# Patient Record
Sex: Male | Born: 1975 | Race: Black or African American | Hispanic: No | Smoking: Never smoker
Health system: Southern US, Community
[De-identification: ages and names within clinical notes are randomized; demographics above are authoritative.]

## PROBLEM LIST (undated history)

## (undated) DIAGNOSIS — I10 Essential (primary) hypertension: Secondary | ICD-10-CM

## (undated) DIAGNOSIS — F32A Depression, unspecified: Secondary | ICD-10-CM

## (undated) DIAGNOSIS — I639 Cerebral infarction, unspecified: Secondary | ICD-10-CM

## (undated) DIAGNOSIS — E669 Obesity, unspecified: Secondary | ICD-10-CM

## (undated) DIAGNOSIS — G4730 Sleep apnea, unspecified: Secondary | ICD-10-CM

## (undated) HISTORY — PX: CARPAL TUNNEL RELEASE: SHX101

## (undated) HISTORY — PX: HERNIA REPAIR: SHX51

## (undated) HISTORY — DX: Obesity, unspecified: E66.9

## (undated) HISTORY — PX: ESOPHAGOGASTRODUODENOSCOPY: SHX1529

## (undated) HISTORY — DX: Depression, unspecified: F32.A

## (undated) HISTORY — DX: Sleep apnea, unspecified: G47.30

## (undated) HISTORY — PX: APPENDECTOMY: SHX54

---

## 2003-04-22 ENCOUNTER — Emergency Department (HOSPITAL_COMMUNITY): Admission: EM | Admit: 2003-04-22 | Discharge: 2003-04-22 | Payer: Self-pay | Admitting: Emergency Medicine

## 2003-08-27 ENCOUNTER — Emergency Department (HOSPITAL_COMMUNITY): Admission: EM | Admit: 2003-08-27 | Discharge: 2003-08-27 | Payer: Self-pay | Admitting: Family Medicine

## 2004-03-22 ENCOUNTER — Emergency Department (HOSPITAL_COMMUNITY): Admission: EM | Admit: 2004-03-22 | Discharge: 2004-03-22 | Payer: Self-pay | Admitting: Family Medicine

## 2004-05-03 ENCOUNTER — Emergency Department (HOSPITAL_COMMUNITY): Admission: EM | Admit: 2004-05-03 | Discharge: 2004-05-03 | Payer: Self-pay | Admitting: Family Medicine

## 2004-08-26 ENCOUNTER — Ambulatory Visit (HOSPITAL_COMMUNITY): Admission: RE | Admit: 2004-08-26 | Discharge: 2004-08-26 | Payer: Self-pay | Admitting: Family Medicine

## 2004-08-26 ENCOUNTER — Ambulatory Visit (HOSPITAL_COMMUNITY): Admission: RE | Admit: 2004-08-26 | Discharge: 2004-08-26 | Payer: Self-pay | Admitting: Sports Medicine

## 2004-08-26 ENCOUNTER — Ambulatory Visit: Payer: Self-pay | Admitting: Sports Medicine

## 2004-08-30 ENCOUNTER — Emergency Department (HOSPITAL_COMMUNITY): Admission: EM | Admit: 2004-08-30 | Discharge: 2004-08-30 | Payer: Self-pay | Admitting: Family Medicine

## 2004-09-14 ENCOUNTER — Ambulatory Visit (HOSPITAL_BASED_OUTPATIENT_CLINIC_OR_DEPARTMENT_OTHER): Admission: RE | Admit: 2004-09-14 | Discharge: 2004-09-14 | Payer: Self-pay | Admitting: Sports Medicine

## 2004-09-19 ENCOUNTER — Ambulatory Visit: Payer: Self-pay | Admitting: Internal Medicine

## 2004-09-23 ENCOUNTER — Ambulatory Visit: Payer: Self-pay | Admitting: Family Medicine

## 2004-11-24 ENCOUNTER — Ambulatory Visit: Payer: Self-pay | Admitting: Sports Medicine

## 2004-12-25 ENCOUNTER — Emergency Department (HOSPITAL_COMMUNITY): Admission: EM | Admit: 2004-12-25 | Discharge: 2004-12-25 | Payer: Self-pay | Admitting: Family Medicine

## 2005-02-20 ENCOUNTER — Emergency Department (HOSPITAL_COMMUNITY): Admission: EM | Admit: 2005-02-20 | Discharge: 2005-02-20 | Payer: Self-pay | Admitting: Emergency Medicine

## 2012-01-05 ENCOUNTER — Emergency Department (HOSPITAL_BASED_OUTPATIENT_CLINIC_OR_DEPARTMENT_OTHER): Payer: Self-pay

## 2012-01-05 ENCOUNTER — Encounter (HOSPITAL_BASED_OUTPATIENT_CLINIC_OR_DEPARTMENT_OTHER): Payer: Self-pay | Admitting: *Deleted

## 2012-01-05 ENCOUNTER — Emergency Department (HOSPITAL_BASED_OUTPATIENT_CLINIC_OR_DEPARTMENT_OTHER)
Admission: EM | Admit: 2012-01-05 | Discharge: 2012-01-05 | Disposition: A | Discharge status: Home / Self Care | Payer: Self-pay | Attending: Emergency Medicine | Admitting: Emergency Medicine

## 2012-01-05 DIAGNOSIS — I10 Essential (primary) hypertension: Secondary | ICD-10-CM | POA: Insufficient documentation

## 2012-01-05 DIAGNOSIS — M545 Low back pain, unspecified: Secondary | ICD-10-CM

## 2012-01-05 DIAGNOSIS — E119 Type 2 diabetes mellitus without complications: Secondary | ICD-10-CM | POA: Insufficient documentation

## 2012-01-05 HISTORY — DX: Essential (primary) hypertension: I10

## 2012-01-05 LAB — URINALYSIS, ROUTINE W REFLEX MICROSCOPIC
Bilirubin Urine: NEGATIVE
Glucose, UA: >1000 mg/dL — AB
Hgb urine dipstick: NEGATIVE
Ketones, ur: 15 mg/dL — AB
Leukocytes, UA: NEGATIVE
Nitrite: NEGATIVE
Protein, ur: NEGATIVE mg/dL
Specific Gravity, Urine: 1.03 (ref 1.005–1.030)
Urobilinogen, UA: 1 mg/dL (ref 0.0–1.0)
pH: 6 (ref 5.0–8.0)

## 2012-01-05 LAB — URINE MICROSCOPIC-ADD ON

## 2012-01-05 LAB — GLUCOSE, CAPILLARY: Glucose-Capillary: 206 mg/dL — ABNORMAL HIGH (ref 70–99)

## 2012-01-05 MED ORDER — IBUPROFEN 800 MG PO TABS
800.0000 mg | ORAL_TABLET | Freq: Once | ORAL | Status: AC
  Administered 2012-01-05: 800 mg via ORAL
  Filled 2012-01-05: qty 1

## 2012-01-05 MED ORDER — DIAZEPAM 5 MG PO TABS
5.0000 mg | ORAL_TABLET | Freq: Once | ORAL | Status: AC
  Administered 2012-01-05: 5 mg via ORAL
  Filled 2012-01-05: qty 1

## 2012-01-05 MED ORDER — ONDANSETRON HCL 4 MG PO TABS: 4.0000 mg | ORAL_TABLET | Freq: Four times a day (QID) | ORAL | Status: AC

## 2012-01-05 MED ORDER — HYDROCODONE-ACETAMINOPHEN 5-325 MG PO TABS
1.0000 | ORAL_TABLET | Freq: Once | ORAL | Status: AC
  Administered 2012-01-05: 1 via ORAL
  Filled 2012-01-05: qty 1

## 2012-01-05 MED ORDER — HYDROCODONE-ACETAMINOPHEN 5-500 MG PO TABS: 1.0000 | ORAL_TABLET | Freq: Four times a day (QID) | ORAL | Status: AC | PRN

## 2012-01-05 MED ORDER — IBUPROFEN 600 MG PO TABS: 600.0000 mg | ORAL_TABLET | Freq: Four times a day (QID) | ORAL | Status: AC | PRN

## 2012-01-05 NOTE — ED Provider Notes (Signed)
History     CSN: [id number]  Arrival date & time 01/05/12  1146   First MD Initiated Contact with Patient 01/05/12 1305      Chief Complaint  Patient presents with  . Flank Pain  . Back Pain    (Consider location/radiation/quality/duration/timing/severity/associated sxs/prior treatment) HPI Pt presents with c/o left sided low back pain.  Pain is worse with movement and palpation.  Pain started approx 3 days ago and has been gradually worsening.  No dysuria.  No fever/chills.  No falls or injuries.  He denies any new activities.  No weakness of legs, no incontinence of bowel or bladder, no urinary retention.  He has not had any treatment for symptoms prior to arrival.  There are no other associated systemic symptoms, there are no other alleviating or modifying factors.   Past Medical History  Diagnosis Date  . Hypertension   . Diabetes mellitus     Past Surgical History  Procedure Date  . Appendectomy   . Hernia repair   . Carpal tunnel release     No family history on file.  History  Substance Use Topics  . Smoking status: Never Smoker   . Smokeless tobacco: Never Used  . Alcohol Use: No      Review of Systems ROS reviewed and all otherwise negative except for mentioned in HPI  Allergies  Percocet  Home Medications   Current Outpatient Rx  Name Route Sig Dispense Refill  . LISINOPRIL-HYDROCHLOROTHIAZIDE 20-25 MG PO TABS Oral Take 1 tablet by mouth daily.    Marland Kitchen METFORMIN HCL 500 MG PO TABS Oral Take 500 mg by mouth 2 (two) times daily with a meal.    . HYDROCODONE-ACETAMINOPHEN 5-500 MG PO TABS Oral Take 1-2 tablets by mouth every 6 (six) hours as needed for pain. 15 tablet 0  . IBUPROFEN 600 MG PO TABS Oral Take 1 tablet (600 mg total) by mouth every 6 (six) hours as needed for pain. 30 tablet 0  . ONDANSETRON HCL 4 MG PO TABS Oral Take 1 tablet (4 mg total) by mouth every 6 (six) hours. 12 tablet 0    BP 143/88  Pulse 67  Temp 98.7 F (37.1 C) (Oral)   Resp 18  Ht 5\' 8"  (1.727 m)  Wt 330 lb (149.687 kg)  BMI 50.18 kg/m2  SpO2 99% Vitals reviewed Physical Exam Physical Examination: General appearance - alert, well appearing, and in no distress Mental status - alert, oriented to person, place, and time Eyes - no scleral icterus, no conjunctival injection Neck - supple, no midline tenderness, FROM without pain Chest - clear to auscultation, no wheezes, rales or rhonchi, symmetric air entry Heart - normal rate, regular rhythm, normal S1, S2, no murmurs, rubs, clicks or gallops Abdomen - soft, nontender, nondistended, no masses or organomegaly Back exam - ttp overlying lumbar spine in midline as well as left paraspinous tenderness in lumbar region, no CVA tenderness Neurological - alert, oriented, normal speech, strength 5/5 in extremities x 4, sensation intact Musculoskeletal - no joint tenderness, deformity or swelling Extremities - peripheral pulses normal, no pedal edema, no clubbing or cyanosis Skin - normal coloration and turgor, no rashes  ED Course  Procedures (including critical care time)  Labs Reviewed  URINALYSIS, ROUTINE W REFLEX MICROSCOPIC - Abnormal; Notable for the following:    Glucose, UA >1000 (*)     Ketones, ur 15 (*)     All other components within normal limits  GLUCOSE, CAPILLARY - Abnormal; Notable  for the following:    Glucose-Capillary 206 (*)     All other components within normal limits  URINE MICROSCOPIC-ADD ON   No results found.   1. Low back pain       MDM  Pt presenting with c/o low back pain- no signs or symptoms of cauda equina.  Pain is musculoskeletal in nature on exam.  No hematuria or signs of infection on UA.  Did have > 1000 glucose.  Pt states he was recently diagnosed with diabetes, has been taking metformin.  Blood glucose checked and is 206.  Pt is overall nontoxic and well hydrated in appearance.  Pt advised of strict return precautions and to have his blood sugar checked  regularly.  Discharged with strict return precautions.  Pt agreeable with plan.        Ethelda Chick, MD 01/07/12 1425

## 2012-01-05 NOTE — ED Notes (Signed)
Returned from [employer], family remains at bedside

## 2012-01-05 NOTE — ED Notes (Signed)
Patient transported to X-ray 

## 2012-01-05 NOTE — ED Notes (Signed)
Left mid quad pain x 3 days. No hx of same.

## 2012-01-05 NOTE — ED Notes (Signed)
Patient states he has had pain in his left lower back radiating into his left flank and groin area for the last 3 days.

## 2014-08-20 ENCOUNTER — Emergency Department (HOSPITAL_BASED_OUTPATIENT_CLINIC_OR_DEPARTMENT_OTHER): Payer: Medicaid Other

## 2014-08-20 ENCOUNTER — Encounter (HOSPITAL_BASED_OUTPATIENT_CLINIC_OR_DEPARTMENT_OTHER): Payer: Self-pay | Admitting: Emergency Medicine

## 2014-08-20 ENCOUNTER — Emergency Department (HOSPITAL_BASED_OUTPATIENT_CLINIC_OR_DEPARTMENT_OTHER)
Admission: EM | Admit: 2014-08-20 | Discharge: 2014-08-20 | Disposition: A | Discharge status: Home / Self Care | Payer: Medicaid Other | Attending: Emergency Medicine | Admitting: Emergency Medicine

## 2014-08-20 DIAGNOSIS — I10 Essential (primary) hypertension: Secondary | ICD-10-CM | POA: Diagnosis not present

## 2014-08-20 DIAGNOSIS — Z79899 Other long term (current) drug therapy: Secondary | ICD-10-CM | POA: Insufficient documentation

## 2014-08-20 DIAGNOSIS — R0789 Other chest pain: Secondary | ICD-10-CM | POA: Diagnosis not present

## 2014-08-20 DIAGNOSIS — Z794 Long term (current) use of insulin: Secondary | ICD-10-CM | POA: Insufficient documentation

## 2014-08-20 DIAGNOSIS — J9801 Acute bronchospasm: Secondary | ICD-10-CM

## 2014-08-20 DIAGNOSIS — E119 Type 2 diabetes mellitus without complications: Secondary | ICD-10-CM | POA: Insufficient documentation

## 2014-08-20 DIAGNOSIS — R079 Chest pain, unspecified: Secondary | ICD-10-CM | POA: Diagnosis present

## 2014-08-20 LAB — TROPONIN I: Troponin I: <0.03 ng/mL (ref <0.031)

## 2014-08-20 LAB — CBC WITH DIFFERENTIAL/PLATELET
Basophils Absolute: 0 10*3/uL (ref 0.0–0.1)
Basophils Relative: 0 % (ref 0–1)
Eosinophils Absolute: 0.1 10*3/uL (ref 0.0–0.7)
Eosinophils Relative: 2 % (ref 0–5)
HCT: 39.5 % (ref 39.0–52.0)
Hemoglobin: 13.8 g/dL (ref 13.0–17.0)
Lymphocytes Relative: 32 % (ref 12–46)
Lymphs Abs: 2.4 10*3/uL (ref 0.7–4.0)
MCH: 29.9 pg (ref 26.0–34.0)
MCHC: 34.9 g/dL (ref 30.0–36.0)
MCV: 85.5 fL (ref 78.0–100.0)
Monocytes Absolute: 0.7 10*3/uL (ref 0.1–1.0)
Monocytes Relative: 9 % (ref 3–12)
Neutro Abs: 4.3 10*3/uL (ref 1.7–7.7)
Neutrophils Relative %: 57 % (ref 43–77)
Platelets: 240 10*3/uL (ref 150–400)
RBC: 4.62 MIL/uL (ref 4.22–5.81)
RDW: 12.3 % (ref 11.5–15.5)
WBC: 7.6 10*3/uL (ref 4.0–10.5)

## 2014-08-20 LAB — BASIC METABOLIC PANEL
Anion gap: 9 (ref 5–15)
BUN: 6 mg/dL (ref 6–23)
CO2: 26 mmol/L (ref 19–32)
Calcium: 9.1 mg/dL (ref 8.4–10.5)
Chloride: 100 mmol/L (ref 96–112)
Creatinine, Ser: 0.74 mg/dL (ref 0.50–1.35)
GFR calc Af Amer: >90 mL/min (ref >90)
GFR calc non Af Amer: >90 mL/min (ref >90)
Glucose, Bld: 365 mg/dL — ABNORMAL HIGH (ref 70–99)
Potassium: 3.4 mmol/L — ABNORMAL LOW (ref 3.5–5.1)
Sodium: 135 mmol/L (ref 135–145)

## 2014-08-20 MED ORDER — HYDROCODONE-ACETAMINOPHEN 5-325 MG PO TABS: 1.0000 | ORAL_TABLET | Freq: Four times a day (QID) | ORAL | Status: DC | PRN

## 2014-08-20 MED ORDER — ALBUTEROL SULFATE HFA 108 (90 BASE) MCG/ACT IN AERS
2.0000 | INHALATION_SPRAY | RESPIRATORY_TRACT | Status: DC | PRN
  Administered 2014-08-20: 2 via RESPIRATORY_TRACT
  Filled 2014-08-20: qty 6.7

## 2014-08-20 NOTE — ED Notes (Signed)
Pt sleeping  Waiting for md

## 2014-08-20 NOTE — ED Notes (Signed)
Onset x5-6 days  Chest pain  Worse with movement and inspirations.  Was seen for sam,e at [facility]

## 2014-08-20 NOTE — ED Provider Notes (Addendum)
CSN: [id number]     Arrival date & time 08/20/14  0113 History   First MD Initiated Contact with Patient 08/20/14 0543     Chief Complaint  Patient presents with  . Chest Pain     (Consider location/radiation/quality/duration/timing/severity/associated sxs/prior Treatment) HPI  This is a 39 year old male with a one-week history of pain in his left upper chest. He describes the pain as a pressure. It is moderate to severe at its worst. It is worse with breathing, movement and lying supine. It is improved when sitting up. The pain radiates to his left neck and shoulder. He is also been having some shortness of breath and cough. He was seen 3 days ago at [facility] where he had a negative workup. His symptoms persisted so he came here.  Past Medical History  Diagnosis Date  . Hypertension   . Diabetes mellitus    Past Surgical History  Procedure Laterality Date  . Appendectomy    . Hernia repair    . Carpal tunnel release     No family history on file. History  Substance Use Topics  . Smoking status: Never Smoker   . Smokeless tobacco: Never Used  . Alcohol Use: No    Review of Systems  All other systems reviewed and are negative.   Allergies  Percocet  Home Medications   Prior to Admission medications   Medication Sig Start Date End Date Taking? Authorizing Provider  glipiZIDE (GLUCOTROL) 10 MG tablet Take 10 mg by mouth daily before breakfast.   Yes Historical Provider, MD  insulin glargine (LANTUS) 100 UNIT/ML injection Inject 40 Units into the skin at bedtime.   Yes Historical Provider, MD  potassium chloride SA (K-DUR,KLOR-CON) 20 MEQ tablet Take 20 mEq by mouth 2 (two) times daily.   Yes Historical Provider, MD  lisinopril-hydrochlorothiazide (PRINZIDE,ZESTORETIC) 20-25 MG per tablet Take 1 tablet by mouth daily.    Historical Provider, MD  metFORMIN (GLUCOPHAGE) 500 MG tablet Take 500 mg by mouth 2 (two) times daily with a meal.    Historical Provider, MD    BP 150/88 mmHg  Pulse 70  Temp(Src) 98.3 F (36.8 C) (Oral)  Resp 22  Ht 5\' 8"  (1.727 m)  Wt 322 lb (146.058 kg)  BMI 48.97 kg/m2  SpO2 98%   Physical Exam  General: Well-developed, well-nourished male in no acute distress; appearance consistent with age of record HENT: normocephalic; atraumatic Eyes: pupils equal, round and reactive to light; extraocular muscles intact Neck: supple; mild exacerbation of chest pain with rotation of head to right Heart: regular rate and rhythm Lungs: Decreased air movement bilaterally without wheezing; occasional cough Chest: Left upper chest wall tenderness which reproduces the pain of the chief complaint Abdomen: soft; nondistended; nontender; bowel sounds present Extremities: No deformity; full range of motion; pulses normal; mild exacerbation of chest pain with movement of left arm at the shoulder Neurologic: Awake, alert and oriented; motor function intact in all extremities and symmetric; no facial droop Skin: Warm and dry Psychiatric: Normal mood and affect    ED Course  Procedures (including critical care time)   MDM   Nursing notes and vitals signs, including pulse oximetry, reviewed.  Summary of this visit's results, reviewed by myself:   EKG Interpretation  Date/Time:  Wednesday August 20 2014 01:20:25 EDT Ventricular Rate:  81 PR Interval:  174 QRS Duration: 104 QT Interval:  370 QTC Calculation: 429 R Axis:   -4 Text Interpretation:  Normal sinus rhythm Nonspecific T  wave abnormality Abnormal ECG No significant change was found Confirmed by Harleen Fineberg  MD, Jenny Reichmann ([id number]) on 08/20/2014 5:45:05 AM       Labs:  Results for orders placed or performed during the hospital encounter of 08/20/14 (from the past 24 hour(s))  Basic metabolic panel     Status: Abnormal   Collection Time: 08/20/14  1:45 AM  Result Value Ref Range   Sodium 135 135 - 145 mmol/L   Potassium 3.4 (L) 3.5 - 5.1 mmol/L   Chloride 100 96 - 112 mmol/L    CO2 26 19 - 32 mmol/L   Glucose, Bld 365 (H) 70 - 99 mg/dL   BUN 6 6 - 23 mg/dL   Creatinine, Ser 0.74 0.50 - 1.35 mg/dL   Calcium 9.1 8.4 - 10.5 mg/dL   GFR calc non Af Amer >90 >90 mL/min   GFR calc Af Amer >90 >90 mL/min   Anion gap 9 5 - 15  Troponin I (M[facility])     Status: None   Collection Time: 08/20/14  1:45 AM  Result Value Ref Range   Troponin I <0.03 <0.031 ng/mL  CBC with Differential     Status: None   Collection Time: 08/20/14  1:45 AM  Result Value Ref Range   WBC 7.6 4.0 - 10.5 K/uL   RBC 4.62 4.22 - 5.81 MIL/uL   Hemoglobin 13.8 13.0 - 17.0 g/dL   HCT 39.5 39.0 - 52.0 %   MCV 85.5 78.0 - 100.0 fL   MCH 29.9 26.0 - 34.0 pg   MCHC 34.9 30.0 - 36.0 g/dL   RDW 12.3 11.5 - 15.5 %   Platelets 240 150 - 400 K/uL   Neutrophils Relative % 57 43 - 77 %   Neutro Abs 4.3 1.7 - 7.7 K/uL   Lymphocytes Relative 32 12 - 46 %   Lymphs Abs 2.4 0.7 - 4.0 K/uL   Monocytes Relative 9 3 - 12 %   Monocytes Absolute 0.7 0.1 - 1.0 K/uL   Eosinophils Relative 2 0 - 5 %   Eosinophils Absolute 0.1 0.0 - 0.7 K/uL   Basophils Relative 0 0 - 1 %   Basophils Absolute 0.0 0.0 - 0.1 K/uL    Imaging Studies: Dg Chest 2 View  08/20/2014   CLINICAL DATA:  Mid to left-sided chest pain for 4 days. Initial encounter.  EXAM: CHEST  2 VIEW  COMPARISON:  Chest radiograph performed 08/17/2014  FINDINGS: The lungs are well-aerated and clear. There is no evidence of focal opacification, pleural effusion or pneumothorax.  The heart is borderline normal in size. No acute osseous abnormalities are seen.  IMPRESSION: No acute cardiopulmonary process seen.   Electronically Signed   By: Garald Balding M.D.   On: 08/20/2014 02:43   5:56 AM History and exam consistent with a combination of chest wall pain and mild bronchospasm. Patient has no history of asthma. He was advised of his elevated glucose; he states he has been compliant with his insulin and oral anti-hyperglycemics. He will follow-up with his primary  care physician, Dr. Alroy Dust.  6:17 AM Air movement improved significantly after albuterol inhaler treatment.    Shanon Rosser, MD 08/20/14 1103  Shanon Rosser, MD 08/20/14 [phone]

## 2014-08-20 NOTE — ED Notes (Signed)
C/o chest pressure x 5 days  Was seen at [facility] on Sunday for same,  States pressure to chest radiating to left shoulder and arm  [pain increased w movement and inspiration

## 2014-11-24 ENCOUNTER — Encounter (HOSPITAL_BASED_OUTPATIENT_CLINIC_OR_DEPARTMENT_OTHER): Payer: Self-pay | Admitting: *Deleted

## 2014-11-24 ENCOUNTER — Emergency Department (HOSPITAL_BASED_OUTPATIENT_CLINIC_OR_DEPARTMENT_OTHER)
Admission: EM | Admit: 2014-11-24 | Discharge: 2014-11-24 | Disposition: A | Discharge status: Home / Self Care | Payer: Medicaid Other | Attending: Emergency Medicine | Admitting: Emergency Medicine

## 2014-11-24 ENCOUNTER — Emergency Department (HOSPITAL_BASED_OUTPATIENT_CLINIC_OR_DEPARTMENT_OTHER): Payer: Medicaid Other

## 2014-11-24 DIAGNOSIS — R0602 Shortness of breath: Secondary | ICD-10-CM | POA: Insufficient documentation

## 2014-11-24 DIAGNOSIS — I10 Essential (primary) hypertension: Secondary | ICD-10-CM | POA: Insufficient documentation

## 2014-11-24 DIAGNOSIS — R739 Hyperglycemia, unspecified: Secondary | ICD-10-CM

## 2014-11-24 DIAGNOSIS — E1165 Type 2 diabetes mellitus with hyperglycemia: Secondary | ICD-10-CM | POA: Diagnosis not present

## 2014-11-24 DIAGNOSIS — R079 Chest pain, unspecified: Secondary | ICD-10-CM | POA: Insufficient documentation

## 2014-11-24 DIAGNOSIS — Z794 Long term (current) use of insulin: Secondary | ICD-10-CM | POA: Insufficient documentation

## 2014-11-24 DIAGNOSIS — Z79899 Other long term (current) drug therapy: Secondary | ICD-10-CM | POA: Diagnosis not present

## 2014-11-24 DIAGNOSIS — F439 Reaction to severe stress, unspecified: Secondary | ICD-10-CM | POA: Diagnosis not present

## 2014-11-24 LAB — I-STAT CHEM 8, ED
BUN: 4 mg/dL — ABNORMAL LOW (ref 6–20)
Calcium, Ion: 1.3 mmol/L — ABNORMAL HIGH (ref 1.12–1.23)
Chloride: 99 mmol/L — ABNORMAL LOW (ref 101–111)
Creatinine, Ser: 0.7 mg/dL (ref 0.61–1.24)
Glucose, Bld: 366 mg/dL — ABNORMAL HIGH (ref 65–99)
HCT: 43 % (ref 39.0–52.0)
Hemoglobin: 14.6 g/dL (ref 13.0–17.0)
Potassium: 3.9 mmol/L (ref 3.5–5.1)
Sodium: 138 mmol/L (ref 135–145)
TCO2: 23 mmol/L (ref 0–100)

## 2014-11-24 LAB — COMPREHENSIVE METABOLIC PANEL
ALBUMIN: 3.9 g/dL (ref 3.5–5.0)
ALK PHOS: 53 U/L (ref 38–126)
ALT: 31 U/L (ref 17–63)
ANION GAP: 11 (ref 5–15)
AST: 30 U/L (ref 15–41)
BILIRUBIN TOTAL: 1.2 mg/dL (ref 0.3–1.2)
BUN: <5 mg/dL — ABNORMAL LOW (ref 6–20)
CALCIUM: 9 mg/dL (ref 8.9–10.3)
CO2: 22 mmol/L (ref 22–32)
CREATININE: 1.04 mg/dL (ref 0.61–1.24)
Chloride: 101 mmol/L (ref 101–111)
GFR calc Af Amer: >60 mL/min (ref >60)
Glucose, Bld: 456 mg/dL — ABNORMAL HIGH (ref 65–99)
POTASSIUM: 3.7 mmol/L (ref 3.5–5.1)
SODIUM: 134 mmol/L — AB (ref 135–145)
TOTAL PROTEIN: 7 g/dL (ref 6.5–8.1)

## 2014-11-24 LAB — CBC WITH DIFFERENTIAL/PLATELET
Basophils Absolute: 0 10*3/uL (ref 0.0–0.1)
Basophils Relative: 0 % (ref 0–1)
EOS ABS: 0.1 10*3/uL (ref 0.0–0.7)
Eosinophils Relative: 1 % (ref 0–5)
HEMATOCRIT: 39.2 % (ref 39.0–52.0)
Hemoglobin: 13.1 g/dL (ref 13.0–17.0)
Lymphocytes Relative: 25 % (ref 12–46)
Lymphs Abs: 2.1 10*3/uL (ref 0.7–4.0)
MCH: 29.5 pg (ref 26.0–34.0)
MCHC: 33.4 g/dL (ref 30.0–36.0)
MCV: 88.3 fL (ref 78.0–100.0)
MONOS PCT: 10 % (ref 3–12)
Monocytes Absolute: 0.8 10*3/uL (ref 0.1–1.0)
Neutro Abs: 5.1 10*3/uL (ref 1.7–7.7)
Neutrophils Relative %: 64 % (ref 43–77)
PLATELETS: 267 10*3/uL (ref 150–400)
RBC: 4.44 MIL/uL (ref 4.22–5.81)
RDW: 12.4 % (ref 11.5–15.5)
WBC: 8.1 10*3/uL (ref 4.0–10.5)

## 2014-11-24 LAB — TROPONIN I
Troponin I: <0.03 ng/mL (ref <0.031)
Troponin I: <0.03 ng/mL (ref <0.031)

## 2014-11-24 MED ORDER — HYDROCODONE-ACETAMINOPHEN 5-325 MG PO TABS
2.0000 | ORAL_TABLET | Freq: Once | ORAL | Status: AC
  Administered 2014-11-24: 2 via ORAL
  Filled 2014-11-24: qty 2

## 2014-11-24 MED ORDER — GI COCKTAIL ~~LOC~~
30.0000 mL | Freq: Once | ORAL | Status: AC
  Administered 2014-11-24: 30 mL via ORAL
  Filled 2014-11-24: qty 30

## 2014-11-24 NOTE — ED Notes (Signed)
Family at bedside. 

## 2014-11-24 NOTE — ED Notes (Signed)
Student PA-C at bedside for evaluation.

## 2014-11-24 NOTE — ED Provider Notes (Signed)
CSN: [id number]     Arrival date & time 11/24/14  1309 History   First MD Initiated Contact with Patient 11/24/14 1310     Chief Complaint  Patient presents with  . 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 Pain is a constant pressure with occasional episodes of sharp, stabbing pain that come and go at random, radiating to both sides of his jaw with an associated sensation of "a sour candy stuck in my throat". Admits to associated shortness of breath, worse on exertion. This morning, he developed a mild, nonproductive cough. 2 days ago, he started to feel as if he were dragging his left leg on occasion while walking. Denies having this symptom today. Denies fever, chills, nausea, vomiting, diaphoresis, abdominal pain, headache, extremity numbness or tingling. Denies ever having chest pain like this in the past. Reports having chest pain back in April and was told it was his chest wall. He has not tried any medications for his symptoms. Denies a history of heartburn or acid reflux. Nonsmoker. No family history of early heart disease.  [name] presenting with chest pain. The history is provided by the patient.  Chest Pain Associated symptoms: shortness of breath     Past Medical History  Diagnosis Date  . Hypertension   . Diabetes mellitus    Past Surgical History  Procedure Laterality Date  . Appendectomy    . Hernia repair    . Carpal tunnel release     No family history on file. History  Substance Use Topics  . Smoking status: Never Smoker   . Smokeless tobacco: Never Used  . Alcohol Use: No    Review of Systems  Respiratory: Positive for shortness of breath.   Cardiovascular: Positive for chest  pain.  All other systems reviewed and are negative.     Allergies  Percocet  Home Medications   Prior to Admission medications   Medication Sig Start Date End Date Taking? Authorizing Provider  glipiZIDE (GLUCOTROL) 10 MG tablet Take 10 mg by mouth daily before breakfast.    Historical Provider, MD  HYDROcodone-acetaminophen (NORCO/VICODIN) 5-325 MG per tablet Take 1-2 tablets by mouth every 6 (six) hours as needed (for pain). 08/20/14   John Molpus, MD  insulin glargine (LANTUS) 100 UNIT/ML injection Inject 40 Units into the skin at bedtime.    Historical Provider, MD  lisinopril-hydrochlorothiazide (PRINZIDE,ZESTORETIC) 20-25 MG per tablet Take 1 tablet by mouth daily.    Historical Provider, MD  metFORMIN (GLUCOPHAGE) 500 MG tablet Take 500 mg by mouth 2 (two) times daily with a meal.    Historical Provider, MD  potassium chloride SA (K-DUR,KLOR-CON) 20 MEQ tablet Take 20 mEq by mouth 2 (two) times daily.    Historical Provider, MD   BP 167/95 mmHg  Pulse 62  Temp(Src) 98.1 F (36.7 C) (Oral)  Resp 16  Ht 5\' 8"  (1.727 m)  Wt 322 lb (146.058 kg)  BMI 48.97 kg/m2  SpO2 100% Physical Exam  Constitutional: He is oriented to person, place, and time. He appears well-developed and well-nourished. No distress.  Morbidly obese.  HENT:  Head: Normocephalic and atraumatic.  Mouth/Throat: Oropharynx is clear and moist.  Eyes: Conjunctivae and EOM are normal. Pupils are equal, round, and reactive to light.  Neck: Normal range of motion.  Neck supple. No JVD present.  Cardiovascular: Normal rate, regular rhythm, normal heart sounds and intact distal pulses.   No extremity edema.  Pulmonary/Chest: Effort normal and breath sounds normal. No respiratory distress. He exhibits no tenderness.  Abdominal: Soft. Bowel sounds are normal. There is no tenderness.  Musculoskeletal: Normal range of motion. He exhibits no edema.  Neurological: He is alert and oriented to person, place, and time. He  has normal strength. No sensory deficit.  Speech fluent, goal oriented. Moves extremities without ataxia. Equal grip strength bilateral.  Skin: Skin is warm and dry. He is not diaphoretic.  Psychiatric: He has a normal mood and affect. His behavior is normal.  Nursing note and vitals reviewed.   ED Course  Procedures (including critical care time) Labs Review Labs Reviewed  I-STAT CHEM 8, ED - Abnormal; Notable for the following:    Chloride 99 (*)    BUN 4 (*)    Glucose, Bld 366 (*)    Calcium, Ion 1.30 (*)    All other components within normal limits  TROPONIN I  CBC WITH DIFFERENTIAL/PLATELET  COMPREHENSIVE METABOLIC PANEL  TROPONIN I    Imaging Review Dg Chest 2 View  11/24/2014   CLINICAL DATA:  Chest pain and tightness today, shortness of breath, nonsmoker  EXAM: CHEST  2 VIEW  COMPARISON:  08/20/2014  FINDINGS: The heart size and mediastinal contours are within normal limits. Both lungs are clear. The visualized skeletal structures are unremarkable.  IMPRESSION: No active cardiopulmonary disease.   Electronically Signed   By: Skipper Cliche M.D.   On: 11/24/2014 14:34     EKG Interpretation   Date/Time:  Monday November 24 2014 13:17:54 EDT Ventricular Rate:  71 PR Interval:  180 QRS Duration: 102 QT Interval:  392 QTC Calculation: 425 R Axis:   37 Text Interpretation:  Normal sinus rhythm Nonspecific T wave abnormality  Abnormal ECG Sinus rhythm T wave abnormality Abnormal ekg Confirmed by  Carmin Muskrat  MD ([id number]) on 11/24/2014 2:59:44 PM      MDM   Final diagnoses:  Chest pain, unspecified chest pain type  Stress  Hyperglycemia   Nontoxic appearing, NAD. AF VSS. Symptoms present for greater than 24 hours. EKG without any acute findings. Labs significant for hyperglycemia, patient reports he is compliant with his medications and "keep meaning to speak with my PCP about increasing my insulin". He does not check his blood sugar on a regular basis at home,  takes both Lantus and metformin. Doubt chest pain is CAD. Heart score 3. Doubt PE. PERC negative. Doubt dissection. No improvement after GI cocktail. Initial thought of GERD, heartburn. Plan to obtain delta trop, give vicodin, and reassess.  Pain improved with vicodin. Wife states the pt has been under an immense amount of stress/anxiety recently. Pt agrees. The tightness may be stress/anxiety related as well. Regarding hyperglycemia, I encouraged the patient to check his blood sugar 2-3 times a day, keep a log and discuss with his PCP. Patient signed out to [facility], PA-C at shift change, plan to d/c if neg trop.  Discussed with attending Dr. Vanita Panda who agrees with plan of care.  Carman Ching, PA-C 11/24/14 [place], MD 11/26/14 [phone]

## 2014-11-24 NOTE — ED Notes (Signed)
PA at bedside.

## 2014-11-24 NOTE — ED Notes (Signed)
Chest sharp, dull, tightness since yesterday. Sob.

## 2014-11-24 NOTE — ED Notes (Signed)
Labs recollected

## 2014-11-24 NOTE — Discharge Instructions (Signed)
Be sure to check your blood sugar 2-3 times daily. Keep a log to discuss with your primary care doctor.  Chest Pain (Nonspecific) It is often hard to give a specific diagnosis for the cause of chest pain. There is always a chance that your pain could be related to something serious, such as a heart attack or a blood clot in the lungs. You need to follow up with your health care provider for further evaluation. CAUSES   Heartburn.  Pneumonia or bronchitis.  Anxiety or stress.  Inflammation around your heart (pericarditis) or lung (pleuritis or pleurisy).  A blood clot in the lung.  A collapsed lung (pneumothorax). It can develop suddenly on its own (spontaneous pneumothorax) or from trauma to the chest.  Shingles infection (herpes zoster virus). The chest wall is composed of bones, muscles, and cartilage. Any of these can be the source of the pain.  The bones can be bruised by injury.  The muscles or cartilage can be strained by coughing or overwork.  The cartilage can be affected by inflammation and become sore (costochondritis). DIAGNOSIS  Lab tests or other studies may be needed to find the cause of your pain. Your health care provider may have you take a test called an ambulatory electrocardiogram (ECG). An ECG records your heartbeat patterns over a 24-hour period. You may also have other tests, such as:  Transthoracic echocardiogram (TTE). During echocardiography, sound waves are used to evaluate how blood flows through your heart.  Transesophageal echocardiogram (TEE).  Cardiac monitoring. This allows your health care provider to monitor your heart rate and rhythm in real time.  Holter monitor. This is a portable device that records your heartbeat and can help diagnose heart arrhythmias. It allows your health care provider to track your heart activity for several days, if needed.  Stress tests by exercise or by giving medicine that makes the heart beat faster. TREATMENT    Treatment depends on what may be causing your chest pain. Treatment may include:  Acid blockers for heartburn.  Anti-inflammatory medicine.  Pain medicine for inflammatory conditions.  Antibiotics if an infection is present.  You may be advised to change lifestyle habits. This includes stopping smoking and avoiding alcohol, caffeine, and chocolate.  You may be advised to keep your head raised (elevated) when sleeping. This reduces the chance of acid going backward from your stomach into your esophagus. Most of the time, nonspecific chest pain will improve within 2-3 days with rest and mild pain medicine.  HOME CARE INSTRUCTIONS   If antibiotics were prescribed, take them as directed. Finish them even if you start to feel better.  For the next few days, avoid physical activities that bring on chest pain. Continue physical activities as directed.  Do not use any tobacco products, including cigarettes, chewing tobacco, or electronic cigarettes.  Avoid drinking alcohol.  Only take medicine as directed by your health care provider.  Follow your health care provider's suggestions for further testing if your chest pain does not go away.  Keep any follow-up appointments you made. If you do not go to an appointment, you could develop lasting (chronic) problems with pain. If there is any problem keeping an appointment, call to reschedule. SEEK MEDICAL CARE IF:   Your chest pain does not go away, even after treatment.  You have a rash with blisters on your chest.  You have a fever. SEEK IMMEDIATE MEDICAL CARE IF:   You have increased chest pain or pain that spreads to your  arm, neck, jaw, back, or abdomen.  You have shortness of breath.  You have an increasing cough, or you cough up blood.  You have severe back or abdominal pain.  You feel nauseous or vomit.  You have severe weakness.  You faint.  You have chills. This is an emergency. Do not wait to see if the pain will  go away. Get medical help at once. Call your local emergency services (911 in [place] Do not drive yourself to the hospital. MAKE SURE YOU:   Understand these instructions.  Will watch your condition.  Will get help right away if you are not doing well or get worse. Document Released: 01/19/2005 Document Revised: 04/16/2013 Document Reviewed: 11/15/2007 [facility] Patient Information 2015 [place], [place]. This information is not intended to replace advice given to you by your health care provider. Make sure you discuss any questions you have with your health care provider.  Hyperglycemia Hyperglycemia occurs when the glucose (sugar) in your blood is too high. Hyperglycemia can happen for many reasons, but it most often happens to people who do not know they have diabetes or are not managing their diabetes properly.  CAUSES  Whether you have diabetes or not, there are other causes of hyperglycemia. Hyperglycemia can occur when you have diabetes, but it can also occur in other situations that you might not be as aware of, such as: Diabetes  If you have diabetes and are having problems controlling your blood glucose, hyperglycemia could occur because of some of the following reasons:  Not following your meal plan.  Not taking your diabetes medications or not taking it properly.  Exercising less or doing less activity than you normally do.  Being sick. Pre-diabetes  This cannot be ignored. Before people develop Type 2 diabetes, they almost always have "pre-diabetes." This is when your blood glucose levels are higher than normal, but not yet high enough to be diagnosed as diabetes. Research has shown that some long-term damage to the body, especially the heart and circulatory system, may already be occurring during pre-diabetes. If you take action to manage your blood glucose when you have pre-diabetes, you may delay or prevent Type 2 diabetes from developing. Stress  If you have diabetes, you  may be "diet" controlled or on oral medications or insulin to control your diabetes. However, you may find that your blood glucose is higher than usual in the hospital whether you have diabetes or not. This is often referred to as "stress hyperglycemia." Stress can elevate your blood glucose. This happens because of hormones put out by the body during times of stress. If stress has been the cause of your high blood glucose, it can be followed regularly by your caregiver. That way he/she can make sure your hyperglycemia does not continue to get worse or progress to diabetes. Steroids  Steroids are medications that act on the infection fighting system (immune system) to block inflammation or infection. One side effect can be a rise in blood glucose. Most people can produce enough extra insulin to allow for this rise, but for those who cannot, steroids make blood glucose levels go even higher. It is not unusual for steroid treatments to "uncover" diabetes that is developing. It is not always possible to determine if the hyperglycemia will go away after the steroids are stopped. A special blood test called an A1c is sometimes done to determine if your blood glucose was elevated before the steroids were started. SYMPTOMS  Thirsty.  Frequent urination.  Dry  mouth.  Blurred vision.  Tired or fatigue.  Weakness.  Sleepy.  Tingling in feet or leg. DIAGNOSIS  Diagnosis is made by monitoring blood glucose in one or all of the following ways:  A1c test. This is a chemical found in your blood.  Fingerstick blood glucose monitoring.  Laboratory results. TREATMENT  First, knowing the cause of the hyperglycemia is important before the hyperglycemia can be treated. Treatment may include, but is not be limited to:  Education.  Change or adjustment in medications.  Change or adjustment in meal plan.  Treatment for an illness, infection, etc.  More frequent blood glucose monitoring.  Change in  exercise plan.  Decreasing or stopping steroids.  Lifestyle changes. HOME CARE INSTRUCTIONS   Test your blood glucose as directed.  Exercise regularly. Your caregiver will give you instructions about exercise. Pre-diabetes or diabetes which comes on with stress is helped by exercising.  Eat wholesome, balanced meals. Eat often and at regular, fixed times. Your caregiver or nutritionist will give you a meal plan to guide your sugar intake.  Being at an ideal weight is important. If needed, losing as little as 10 to 15 pounds may help improve blood glucose levels. SEEK MEDICAL CARE IF:   You have questions about medicine, activity, or diet.  You continue to have symptoms (problems such as increased thirst, urination, or weight gain). SEEK IMMEDIATE MEDICAL CARE IF:   You are vomiting or have diarrhea.  Your breath smells fruity.  You are breathing faster or slower.  You are very sleepy or incoherent.  You have numbness, tingling, or pain in your feet or hands.  You have chest pain.  Your symptoms get worse even though you have been following your caregiver's orders.  If you have any other questions or concerns. Document Released: 10/05/2000 Document Revised: 07/04/2011 Document Reviewed: 08/08/2011 [facility] Patient Information 2015 [place], [place]. This information is not intended to replace advice given to you by your health care provider. Make sure you discuss any questions you have with your health care provider.  Stress Stress-related medical problems are becoming increasingly common. The body has a built-in physical response to stressful situations. Faced with pressure, challenge or danger, we need to react quickly. Our bodies release hormones such as cortisol and adrenaline to help do this. These hormones are part of the "fight or flight" response and affect the metabolic rate, heart rate and blood pressure, resulting in a heightened, stressed state that prepares the body  for optimum performance in dealing with a stressful situation. It is likely that early man required these mechanisms to stay alive, but usually modern stresses do not call for this, and the same hormones released in today's world can damage health and reduce coping ability. CAUSES  Pressure to perform at work, at school or in sports.  Threats of physical violence.  Money worries.  Arguments.  Family conflicts.  Divorce or separation from significant other.  Bereavement.  New job or unemployment.  Changes in location.  Alcohol or drug abuse. SOMETIMES, THERE IS NO PARTICULAR REASON FOR DEVELOPING STRESS. Almost all people are at risk of being stressed at some time in their lives. It is important to know that some stress is temporary and some is long term.  Temporary stress will go away when a situation is resolved. Most people can cope with short periods of stress, and it can often be relieved by relaxing, taking a walk or getting any type of exercise, chatting through issues with friends,  or having a good night's sleep.  Chronic (long-term, continuous) stress is much harder to deal with. It can be psychologically and emotionally damaging. It can be harmful both for an individual and for friends and family. SYMPTOMS Everyone reacts to stress differently. There are some common effects that help [place] recognize it. In times of extreme stress, people may:  Shake uncontrollably.  Breathe faster and deeper than normal (hyperventilate).  Vomit.  For people with asthma, stress can trigger an attack.  For some people, stress may trigger migraine headaches, ulcers, and body pain. PHYSICAL EFFECTS OF STRESS MAY INCLUDE:  Loss of energy.  Skin problems.  Aches and pains resulting from tense muscles, including neck ache, backache and tension headaches.  Increased pain from arthritis and other conditions.  Irregular heart beat (palpitations).  Periods of irritability or  anger.  Apathy or depression.  Anxiety (feeling uptight or worrying).  Unusual behavior.  Loss of appetite.  Comfort eating.  Lack of concentration.  Loss of, or decreased, sex-drive.  Increased smoking, drinking, or recreational drug use.  For women, missed periods.  Ulcers, joint pain, and muscle pain. Post-traumatic stress is the stress caused by any serious accident, strong emotional damage, or extremely difficult or violent experience such as rape or war. Post-traumatic stress victims can experience mixtures of emotions such as fear, shame, depression, guilt or anger. It may include recurrent memories or images that may be haunting. These feelings can last for weeks, months or even years after the traumatic event that triggered them. Specialized treatment, possibly with medicines and psychological therapies, is available. If stress is causing physical symptoms, severe distress or making it difficult for you to function as normal, it is worth seeing your caregiver. It is important to remember that although stress is a usual part of life, extreme or prolonged stress can lead to other illnesses that will need treatment. It is better to visit a doctor sooner rather than later. Stress has been linked to the development of high blood pressure and heart disease, as well as insomnia and depression. There is no diagnostic test for stress since everyone reacts to it differently. But a caregiver will be able to spot the physical symptoms, such as:  Headaches.  Shingles.  Ulcers. Emotional distress such as intense worry, low mood or irritability should be detected when the doctor asks pertinent questions to identify any underlying problems that might be the cause. In case there are physical reasons for the symptoms, the doctor may also want to do some tests to exclude certain conditions. If you feel that you are suffering from stress, try to identify the aspects of your life that are causing  it. Sometimes you may not be able to change or avoid them, but even a small change can have a positive ripple effect. A simple lifestyle change can make all the difference. STRATEGIES THAT CAN HELP DEAL WITH STRESS:  Delegating or sharing responsibilities.  Avoiding confrontations.  Learning to be more assertive.  Regular exercise.  Avoid using alcohol or street drugs to cope.  Eating a healthy, balanced diet, rich in fruit and vegetables and proteins.  Finding humor or absurdity in stressful situations.  Never taking on more than you know you can handle comfortably.  Organizing your time better to get as much done as possible.  Talking to friends or family and sharing your thoughts and fears.  Listening to music or relaxation tapes.  Relaxation techniques like deep breathing, meditation, and yoga.  Tensing and then  relaxing your muscles, starting at the toes and working up to the head and neck. If you think that you would benefit from help, either in identifying the things that are causing your stress or in learning techniques to help you relax, see a caregiver who is capable of helping you with this. Rather than relying on medications, it is usually better to try and identify the things in your life that are causing stress and try to deal with them. There are many techniques of managing stress including counseling, psychotherapy, aromatherapy, yoga, and exercise. Your caregiver can help you determine what is best for you. Document Released: 07/02/2002 Document Revised: 04/16/2013 Document Reviewed: 05/29/2007 [facility] Patient Information 2015 [place], [place]. This information is not intended to replace advice given to you by your health care provider. Make sure you discuss any questions you have with your health care provider.

## 2014-11-24 NOTE — ED Notes (Signed)
Pt appears to be sleeping with snoring respirations, easily awakens when called his name.  States pain is "8/10" now.

## 2014-11-24 NOTE — ED Notes (Signed)
Patient transported to X-ray 

## 2015-02-06 DIAGNOSIS — R079 Chest pain, unspecified: Secondary | ICD-10-CM | POA: Insufficient documentation

## 2015-02-09 DIAGNOSIS — Z794 Long term (current) use of insulin: Secondary | ICD-10-CM

## 2015-02-09 DIAGNOSIS — E1165 Type 2 diabetes mellitus with hyperglycemia: Secondary | ICD-10-CM

## 2015-02-09 DIAGNOSIS — I10 Essential (primary) hypertension: Secondary | ICD-10-CM

## 2015-02-09 DIAGNOSIS — Z6841 Body Mass Index (BMI) 40.0 and over, adult: Secondary | ICD-10-CM | POA: Insufficient documentation

## 2015-02-09 DIAGNOSIS — E66813 Obesity, class 3: Secondary | ICD-10-CM | POA: Insufficient documentation

## 2015-02-26 DIAGNOSIS — R0609 Other forms of dyspnea: Secondary | ICD-10-CM | POA: Insufficient documentation

## 2015-02-26 DIAGNOSIS — G4730 Sleep apnea, unspecified: Secondary | ICD-10-CM | POA: Insufficient documentation

## 2015-04-01 DIAGNOSIS — F4325 Adjustment disorder with mixed disturbance of emotions and conduct: Secondary | ICD-10-CM | POA: Insufficient documentation

## 2015-04-03 DIAGNOSIS — F609 Personality disorder, unspecified: Secondary | ICD-10-CM | POA: Insufficient documentation

## 2015-05-23 ENCOUNTER — Emergency Department (HOSPITAL_BASED_OUTPATIENT_CLINIC_OR_DEPARTMENT_OTHER)
Admission: EM | Admit: 2015-05-23 | Discharge: 2015-05-24 | Disposition: A | Discharge status: Home / Self Care | Payer: Medicaid Other | Attending: Emergency Medicine | Admitting: Emergency Medicine

## 2015-05-23 ENCOUNTER — Emergency Department (HOSPITAL_BASED_OUTPATIENT_CLINIC_OR_DEPARTMENT_OTHER): Payer: Medicaid Other

## 2015-05-23 ENCOUNTER — Encounter (HOSPITAL_BASED_OUTPATIENT_CLINIC_OR_DEPARTMENT_OTHER): Payer: Self-pay | Admitting: Emergency Medicine

## 2015-05-23 DIAGNOSIS — Z9889 Other specified postprocedural states: Secondary | ICD-10-CM | POA: Diagnosis not present

## 2015-05-23 DIAGNOSIS — R1011 Right upper quadrant pain: Secondary | ICD-10-CM | POA: Diagnosis not present

## 2015-05-23 DIAGNOSIS — R509 Fever, unspecified: Secondary | ICD-10-CM

## 2015-05-23 DIAGNOSIS — R1013 Epigastric pain: Secondary | ICD-10-CM

## 2015-05-23 DIAGNOSIS — R42 Dizziness and giddiness: Secondary | ICD-10-CM | POA: Insufficient documentation

## 2015-05-23 DIAGNOSIS — E119 Type 2 diabetes mellitus without complications: Secondary | ICD-10-CM | POA: Diagnosis not present

## 2015-05-23 DIAGNOSIS — R079 Chest pain, unspecified: Secondary | ICD-10-CM | POA: Diagnosis not present

## 2015-05-23 DIAGNOSIS — Z7984 Long term (current) use of oral hypoglycemic drugs: Secondary | ICD-10-CM | POA: Insufficient documentation

## 2015-05-23 DIAGNOSIS — Z794 Long term (current) use of insulin: Secondary | ICD-10-CM | POA: Insufficient documentation

## 2015-05-23 DIAGNOSIS — R5383 Other fatigue: Secondary | ICD-10-CM | POA: Diagnosis not present

## 2015-05-23 DIAGNOSIS — I10 Essential (primary) hypertension: Secondary | ICD-10-CM | POA: Insufficient documentation

## 2015-05-23 DIAGNOSIS — R197 Diarrhea, unspecified: Secondary | ICD-10-CM | POA: Diagnosis not present

## 2015-05-23 DIAGNOSIS — R1110 Vomiting, unspecified: Secondary | ICD-10-CM

## 2015-05-23 DIAGNOSIS — R112 Nausea with vomiting, unspecified: Secondary | ICD-10-CM | POA: Insufficient documentation

## 2015-05-23 DIAGNOSIS — Z9049 Acquired absence of other specified parts of digestive tract: Secondary | ICD-10-CM | POA: Insufficient documentation

## 2015-05-23 DIAGNOSIS — Z79899 Other long term (current) drug therapy: Secondary | ICD-10-CM | POA: Diagnosis not present

## 2015-05-23 LAB — BASIC METABOLIC PANEL
Anion gap: 10 (ref 5–15)
BUN: 11 mg/dL (ref 6–20)
CO2: 25 mmol/L (ref 22–32)
Calcium: 9.3 mg/dL (ref 8.9–10.3)
Chloride: 102 mmol/L (ref 101–111)
Creatinine, Ser: 0.86 mg/dL (ref 0.61–1.24)
GFR calc Af Amer: >60 mL/min (ref >60)
GFR calc non Af Amer: >60 mL/min (ref >60)
GLUCOSE: 177 mg/dL — AB (ref 65–99)
POTASSIUM: 4.1 mmol/L (ref 3.5–5.1)
Sodium: 137 mmol/L (ref 135–145)

## 2015-05-23 LAB — LIPASE, BLOOD: LIPASE: 31 U/L (ref 11–51)

## 2015-05-23 LAB — CBC
HEMATOCRIT: 37.3 % — AB (ref 39.0–52.0)
Hemoglobin: 12.5 g/dL — ABNORMAL LOW (ref 13.0–17.0)
MCH: 29.8 pg (ref 26.0–34.0)
MCHC: 33.5 g/dL (ref 30.0–36.0)
MCV: 88.8 fL (ref 78.0–100.0)
Platelets: 256 10*3/uL (ref 150–400)
RBC: 4.2 MIL/uL — ABNORMAL LOW (ref 4.22–5.81)
RDW: 12.2 % (ref 11.5–15.5)
WBC: 11.6 10*3/uL — ABNORMAL HIGH (ref 4.0–10.5)

## 2015-05-23 LAB — HEPATIC FUNCTION PANEL
ALBUMIN: 3.8 g/dL (ref 3.5–5.0)
ALT: 18 U/L (ref 17–63)
AST: 19 U/L (ref 15–41)
Alkaline Phosphatase: 50 U/L (ref 38–126)
Bilirubin, Direct: 0.1 mg/dL (ref 0.1–0.5)
Indirect Bilirubin: 0.4 mg/dL (ref 0.3–0.9)
TOTAL PROTEIN: 7.1 g/dL (ref 6.5–8.1)
Total Bilirubin: 0.5 mg/dL (ref 0.3–1.2)

## 2015-05-23 LAB — TROPONIN I: Troponin I: <0.03 ng/mL (ref <0.031)

## 2015-05-23 MED ORDER — SODIUM CHLORIDE 0.9 % IV BOLUS (SEPSIS)
1000.0000 mL | Freq: Once | INTRAVENOUS | Status: AC
  Administered 2015-05-23: 1000 mL via INTRAVENOUS

## 2015-05-23 MED ORDER — ONDANSETRON HCL 4 MG/2ML IJ SOLN
4.0000 mg | Freq: Once | INTRAMUSCULAR | Status: AC
  Administered 2015-05-23: 4 mg via INTRAVENOUS
  Filled 2015-05-23: qty 2

## 2015-05-23 MED ORDER — PROMETHAZINE HCL 25 MG/ML IJ SOLN
25.0000 mg | Freq: Once | INTRAMUSCULAR | Status: AC
  Administered 2015-05-23: 25 mg via INTRAVENOUS
  Filled 2015-05-23: qty 1

## 2015-05-23 MED ORDER — ONDANSETRON HCL 4 MG PO TABS: 4.0000 mg | ORAL_TABLET | Freq: Four times a day (QID) | ORAL | Status: DC

## 2015-05-23 MED ORDER — PROMETHAZINE HCL 25 MG RE SUPP: 25.0000 mg | Freq: Four times a day (QID) | RECTAL | Status: DC | PRN

## 2015-05-23 MED ORDER — MORPHINE SULFATE (PF) 4 MG/ML IV SOLN
4.0000 mg | Freq: Once | INTRAVENOUS | Status: AC
  Administered 2015-05-23: 4 mg via INTRAVENOUS
  Filled 2015-05-23: qty 1

## 2015-05-23 NOTE — Discharge Instructions (Signed)
Return to the ED with any concerns including vomiting and not able to keep down liquids, chest pain, difficulty breathing, decreased level of alertness/lethargy, or any other alarming symptoms

## 2015-05-23 NOTE — ED Provider Notes (Signed)
CSN: [id number]     Arrival date & time 05/23/15  1940 History  By signing my name below, I, Altamease Oiler, attest that this documentation has been prepared under the direction and in the presence of Alfonzo Beers, MD. Electronically Signed: Altamease Oiler, ED Scribe. 05/23/2015. 8:52 PM   Chief Complaint  Patient presents with  . Abdominal Pain   Patient is a 40 y.o. male presenting with abdominal pain. The history is provided by the patient. No language interpreter was used.  Abdominal Pain Pain location:  Epigastric Pain radiates to:  Chest Pain severity:  Severe Onset quality:  Gradual Timing:  Intermittent Progression:  Worsening Chronicity:  New Context: eating   Context: not trauma   Relieved by:  Nothing Worsened by:  Eating Associated symptoms: chest pain, diarrhea, fatigue, hematemesis, nausea and vomiting   Associated symptoms: no fever, no hematochezia and no shortness of breath    Christopher Roberson is a 40 y.o. male with history of HTN and DM who presents to the Emergency Department complaining of intermittent, worsening, 10/10 in severity, epigastric pain with onset 6 days ago. The pain radiates to the right side of the chest. 4 days ago he saw his Cardiologist and was told that it may be his gallbladder.  Associated symptoms include N/V/D for 3-4 days, fatigue, and dizziness with standing. Today he has had 3 episodes of emesis with bright red blood clots noted and 5 episodes of watery diarrhea.  He notes that his symptoms worsened after eating fried chicken. Pt denies measured fever. Pt states that he had a normal heart catheterization in November 2016. NKDA.  Past Medical History  Diagnosis Date  . Hypertension   . Diabetes mellitus    Past Surgical History  Procedure Laterality Date  . Appendectomy    . Hernia repair    . Carpal tunnel release     History reviewed. No pertinent family history. Social History  Substance Use Topics  . Smoking status: Never  Smoker   . Smokeless tobacco: Never Used  . Alcohol Use: No    Review of Systems  Constitutional: Positive for fatigue. Negative for fever.  Respiratory: Negative for shortness of breath.   Cardiovascular: Positive for chest pain.  Gastrointestinal: Positive for nausea, vomiting, abdominal pain, diarrhea and hematemesis. Negative for hematochezia.  Neurological: Positive for dizziness.  All other systems reviewed and are negative.  Allergies  Percocet  Home Medications   Prior to Admission medications   Medication Sig Start Date End Date Taking? Authorizing Provider  amLODipine (NORVASC) 5 MG tablet Take 5 mg by mouth daily.   Yes Historical Provider, MD  carvedilol (COREG) 6.25 MG tablet Take 6.25 mg by mouth 2 (two) times daily with a meal.   Yes Historical Provider, MD  citalopram (CELEXA) 10 MG tablet Take 10 mg by mouth daily.   Yes Historical Provider, MD  Ergocalciferol (VITAMIN D2 PO) Take by mouth.   Yes Historical Provider, MD  sertraline (ZOLOFT) 50 MG tablet Take 50 mg by mouth daily.   Yes Historical Provider, MD  traZODone (DESYREL) 50 MG tablet Take 50 mg by mouth at bedtime.   Yes Historical Provider, MD  glipiZIDE (GLUCOTROL) 10 MG tablet Take 10 mg by mouth daily before breakfast.    Historical Provider, MD  insulin glargine (LANTUS) 100 UNIT/ML injection Inject 40 Units into the skin at bedtime.    Historical Provider, MD  lisinopril-hydrochlorothiazide (PRINZIDE,ZESTORETIC) 20-25 MG per tablet Take 1 tablet by mouth daily.  Historical Provider, MD  metFORMIN (GLUCOPHAGE) 500 MG tablet Take 500 mg by mouth 2 (two) times daily with a meal.    Historical Provider, MD  ondansetron (ZOFRAN) 4 MG tablet Take 1 tablet (4 mg total) by mouth every 6 (six) hours. 05/23/15   Alfonzo Beers, MD  potassium chloride SA (K-DUR,KLOR-CON) 20 MEQ tablet Take 20 mEq by mouth 2 (two) times daily.    Historical Provider, MD  promethazine (PHENERGAN) 25 MG suppository Place 1  suppository (25 mg total) rectally every 6 (six) hours as needed for nausea or vomiting. 05/23/15   Alfonzo Beers, MD   BP 160/86 mmHg  Pulse 93  Temp(Src) 101.8 F (38.8 C) (Oral)  Resp 18  Ht 5\' 8"  (1.727 m)  Wt 308 lb (139.708 kg)  BMI 46.84 kg/m2  SpO2 97% Vitals reviewed Physical Exam  Physical Examination: General appearance - alert, well appearing, and in no distress Mental status - alert, oriented to person, place, and time Eyes -no conjunctival injection no scleral icterus Mouth - mucous membranes moist, pharynx normal without lesions Chest - clear to auscultation, no wheezes, rales or rhonchi, symmetric air entry Heart - normal rate, regular rhythm, normal S1, S2, no murmurs, rubs, clicks or gallops Abdomen - soft, ttp in epigastric region and right upper abdomen, nabs, no gaurding or rebound tenderness, nondistended, no masses or organomegaly Neurological - alert, oriented, normal speech, no focal findings or movement disorder noted Extremities - peripheral pulses normal, no pedal edema, no clubbing or cyanosis Skin - normal coloration and turgor, no rashes  ED Course  Procedures (including critical care time) DIAGNOSTIC STUDIES: Oxygen Saturation is 97% on RA,  normal by my interpretation.    COORDINATION OF CARE: 8:41 PM Discussed treatment plan which includes lab work, CXR, EKG, abdominal US, morphine, Zofran, and IVF with pt at bedside and pt agreed to plan.  Labs Review Labs Reviewed  BASIC METABOLIC PANEL - Abnormal; Notable for the following:    Glucose, Bld 177 (*)    All other components within normal limits  CBC - Abnormal; Notable for the following:    WBC 11.6 (*)    RBC 4.20 (*)    Hemoglobin 12.5 (*)    HCT 37.3 (*)    All other components within normal limits  TROPONIN I  LIPASE, BLOOD  HEPATIC FUNCTION PANEL    Imaging Review Dg Chest 2 View  05/23/2015  CLINICAL DATA:  Patient with epigastric pain and shortness of breath. EXAM: CHEST  2  VIEW COMPARISON:  Chest radiograph 11/24/2014 FINDINGS: Stable enlarged cardiac and mediastinal contours. Low lung volumes. No consolidative pulmonary opacities. No pleural effusion or pneumothorax. Lateral view limited due to overlapping soft tissues. Regional skeleton is unremarkable. IMPRESSION: No active cardiopulmonary disease. Electronically Signed   By: Lovey Newcomer M.D.   On: 05/23/2015 20:45   US Abdomen Complete  05/23/2015  CLINICAL DATA:  Right upper quadrant and epigastric pain for 7 days. Vomiting. EXAM: ABDOMEN ULTRASOUND COMPLETE COMPARISON:  03/24/2014 FINDINGS: Gallbladder: No gallstones or wall thickening visualized. No sonographic Murphy sign noted by sonographer. Common bile duct: Diameter: 4 mm, within normal limits. Liver: Diffusely increased echogenicity of the hepatic parenchyma, consistent with hepatic steatosis. No focal mass lesion identified. IVC: No abnormality visualized. Pancreas: Not well visualized due to overlying bowel gas. Spleen: Size and appearance within normal limits. Right Kidney: Length: 11.2 cm. Echogenicity within normal limits. No mass or hydronephrosis visualized. Left Kidney: Length: 13.6 cm. Echogenicity within normal limits. No  mass or hydronephrosis visualized. Abdominal aorta: No aneurysm visualized. Other findings: None. IMPRESSION: No evidence of cholelithiasis, biliary dilatation, or other acute findings. Diffuse hepatic steatosis again demonstrated. Electronically Signed   By: Earle Gell M.D.   On: 05/23/2015 21:36   I have personally reviewed and evaluated these images and lab results as part of my medical decision-making.   EKG Interpretation   Date/Time:  Saturday May 23 2015 19:56:49 EST Ventricular Rate:  92 PR Interval:  168 QRS Duration: 90 QT Interval:  326 QTC Calculation: 403 R Axis:   49 Text Interpretation:  Normal sinus rhythm Nonspecific T wave abnormality  Abnormal ECG Since previous tracing t wave inversions are new  Confirmed by  Canary Brim  MD, Lanina Larranaga [phone]) on 05/23/2015 8:25:38 PM      MDM   Final diagnoses:  Epigastric pain  Vomiting and diarrhea  Febrile illness    Pt presenting with /co epigastric and right upper abdominal pain.  He also has fever in the ED with vomiting and diarrhea.  Labs are reassuring including troponin, EKG reassuring as well. He had negative catheterization in November 2016 per his report.  US abdomen shows hepatic steatosis - does not show any acute abnormality.  Pt had some ongoing nausea after zofran, given IV phenergan.  Discharged with zofran and phenergan suppository rx.  Discharged with strict return precautions.  Pt agreeable with plan.  I personally performed the services described in this documentation, which was scribed in my presence. The recorded information has been reviewed and is accurate.     Alfonzo Beers, MD 05/24/15 1556

## 2015-05-23 NOTE — ED Notes (Signed)
Patient transported to Ultrasound 

## 2015-05-23 NOTE — ED Notes (Signed)
Assumed care of patient from [place], [place]. Pt sleeping at this time. Family at side. No distress. Call bell within reach. VSS. Will monitor. Relief noted from Promethazine.

## 2015-05-23 NOTE — ED Notes (Signed)
Pt states he started having abd pain about 5 days ago, can't keep anything down.  Called his heart Dr and he told him he thinks it is his gallblader

## 2015-05-23 NOTE — ED Notes (Signed)
Reassessed po trial- pt vomited liquid up. Reports still having epigastric pain rated 8 out of 10. EDP made aware.

## 2015-06-02 DIAGNOSIS — K859 Acute pancreatitis without necrosis or infection, unspecified: Secondary | ICD-10-CM | POA: Insufficient documentation

## 2015-06-10 DIAGNOSIS — F4320 Adjustment disorder, unspecified: Secondary | ICD-10-CM | POA: Insufficient documentation

## 2015-06-10 DIAGNOSIS — F339 Major depressive disorder, recurrent, unspecified: Secondary | ICD-10-CM | POA: Insufficient documentation

## 2015-06-10 DIAGNOSIS — K429 Umbilical hernia without obstruction or gangrene: Secondary | ICD-10-CM | POA: Insufficient documentation

## 2015-06-24 DIAGNOSIS — E559 Vitamin D deficiency, unspecified: Secondary | ICD-10-CM | POA: Insufficient documentation

## 2015-08-17 DIAGNOSIS — R002 Palpitations: Secondary | ICD-10-CM | POA: Insufficient documentation

## 2015-09-03 ENCOUNTER — Encounter (HOSPITAL_BASED_OUTPATIENT_CLINIC_OR_DEPARTMENT_OTHER): Payer: Self-pay | Admitting: Emergency Medicine

## 2015-09-03 ENCOUNTER — Emergency Department (HOSPITAL_BASED_OUTPATIENT_CLINIC_OR_DEPARTMENT_OTHER)
Admission: EM | Admit: 2015-09-03 | Discharge: 2015-09-03 | Disposition: A | Discharge status: Home / Self Care | Payer: Medicaid Other | Attending: Emergency Medicine | Admitting: Emergency Medicine

## 2015-09-03 DIAGNOSIS — K439 Ventral hernia without obstruction or gangrene: Secondary | ICD-10-CM | POA: Insufficient documentation

## 2015-09-03 DIAGNOSIS — L03311 Cellulitis of abdominal wall: Secondary | ICD-10-CM | POA: Insufficient documentation

## 2015-09-03 DIAGNOSIS — Z794 Long term (current) use of insulin: Secondary | ICD-10-CM | POA: Diagnosis not present

## 2015-09-03 DIAGNOSIS — I10 Essential (primary) hypertension: Secondary | ICD-10-CM | POA: Insufficient documentation

## 2015-09-03 DIAGNOSIS — E119 Type 2 diabetes mellitus without complications: Secondary | ICD-10-CM | POA: Insufficient documentation

## 2015-09-03 DIAGNOSIS — R109 Unspecified abdominal pain: Secondary | ICD-10-CM | POA: Diagnosis present

## 2015-09-03 LAB — URINE MICROSCOPIC-ADD ON: RBC / HPF: NONE SEEN RBC/hpf (ref 0–5)

## 2015-09-03 LAB — URINALYSIS, ROUTINE W REFLEX MICROSCOPIC
Bilirubin Urine: NEGATIVE
Hgb urine dipstick: NEGATIVE
Ketones, ur: NEGATIVE mg/dL
LEUKOCYTES UA: NEGATIVE
NITRITE: NEGATIVE
PH: 6 (ref 5.0–8.0)
PROTEIN: NEGATIVE mg/dL
Specific Gravity, Urine: 1.036 — ABNORMAL HIGH (ref 1.005–1.030)

## 2015-09-03 MED ORDER — CEPHALEXIN 500 MG PO CAPS: 1000.0000 mg | ORAL_CAPSULE | Freq: Two times a day (BID) | ORAL | Status: DC

## 2015-09-03 NOTE — ED Notes (Signed)
Pt reports that his glucose was 309 this am, pt states this is normal for him, he is working with his pcp to adjust his insulin

## 2015-09-03 NOTE — ED Notes (Signed)
Pt with hernia that has been seen multiple times at [facility], was last seen 5/2 at [facility], pain resolved there, however pt reports his hernia has popped back out again causing severe abdominal pain, diarrhea, denies vomiting,

## 2015-09-03 NOTE — Discharge Instructions (Signed)
Cellulitis Cellulitis is an infection of the skin and the tissue beneath it. The infected area is usually red and tender. Cellulitis occurs most often in the arms and lower legs.  CAUSES  Cellulitis is caused by bacteria that enter the skin through cracks or cuts in the skin. The most common types of bacteria that cause cellulitis are staphylococci and streptococci. SIGNS AND SYMPTOMS   Redness and warmth.  Swelling.  Tenderness or pain.  Fever. DIAGNOSIS  Your health care provider can usually determine what is wrong based on a physical exam. Blood tests may also be done. TREATMENT  Treatment usually involves taking an antibiotic medicine. HOME CARE INSTRUCTIONS   Take your antibiotic medicine as directed by your health care provider. Finish the antibiotic even if you start to feel better.  Keep the infected arm or leg elevated to reduce swelling.  Apply a warm cloth to the affected area up to 4 times per day to relieve pain.  Take medicines only as directed by your health care provider.  Keep all follow-up visits as directed by your health care provider. SEEK MEDICAL CARE IF:   You notice red streaks coming from the infected area.  Your red area gets larger or turns dark in color.  Your bone or joint underneath the infected area becomes painful after the skin has healed.  Your infection returns in the same area or another area.  You notice a swollen bump in the infected area.  You develop new symptoms.  You have a fever. SEEK IMMEDIATE MEDICAL CARE IF:   You feel very sleepy.  You develop vomiting or diarrhea.  You have a general ill feeling (malaise) with muscle aches and pains.   This information is not intended to replace advice given to you by your health care provider. Make sure you discuss any questions you have with your health care provider.   Document Released: 01/19/2005 Document Revised: 12/31/2014 Document Reviewed: 06/27/2011 Elsevier Interactive  Patient Education 2016 [place], Adult A hernia is the bulging of an organ or tissue through a weak spot in the muscles of the abdomen (abdominal wall). Hernias develop most often near the navel or groin. There are many kinds of hernias. Common kinds include:  Femoral hernia. This kind of hernia develops under the groin in the upper thigh area.  Inguinal hernia. This kind of hernia develops in the groin or scrotum.  Umbilical hernia. This kind of hernia develops near the navel.  Hiatal hernia. This kind of hernia causes part of the stomach to be pushed up into the chest.  Incisional hernia. This kind of hernia bulges through a scar from an abdominal surgery. CAUSES This condition may be caused by:  Heavy lifting.  Coughing over a long period of time.  Straining to have a bowel movement.  An incision made during an abdominal surgery.  A birth defect (congenital defect).  Excess weight or obesity.  Smoking.  Poor nutrition.  Cystic fibrosis.  Excess fluid in the abdomen.  Undescended testicles. SYMPTOMS Symptoms of a hernia include:  A lump on the abdomen. This is the first sign of a hernia. The lump may become more obvious with standing, straining, or coughing. It may get bigger over time if it is not treated or if the condition causing it is not treated.  Pain. A hernia is usually painless, but it may become painful over time if treatment is delayed. The pain is usually dull and may get worse with standing  or lifting heavy objects. Sometimes a hernia gets tightly squeezed in the weak spot (strangulated) or stuck there (incarcerated) and causes additional symptoms. These symptoms may include:  Vomiting.  Nausea.  Constipation.  Irritability. DIAGNOSIS A hernia may be diagnosed with:  A physical exam. During the exam your health care provider may ask you to cough or to make a specific movement, because a hernia is usually more visible when you  move.  Imaging tests. These can include:  X-rays.  Ultrasound.  CT scan. TREATMENT A hernia that is small and painless may not need to be treated. A hernia that is large or painful may be treated with surgery. Inguinal hernias may be treated with surgery to prevent incarceration or strangulation. Strangulated hernias are always treated with surgery, because lack of blood to the trapped organ or tissue can cause it to die. Surgery to treat a hernia involves pushing the bulge back into place and repairing the weak part of the abdomen. HOME CARE INSTRUCTIONS  Avoid straining.  Do not lift anything heavier than 10 lb (4.5 kg).  Lift with your leg muscles, not your back muscles. This helps avoid strain.  When coughing, try to cough gently.  Prevent constipation. Constipation leads to straining with bowel movements, which can make a hernia worse or cause a hernia repair to break down. You can prevent constipation by:  Eating a high-fiber diet that includes plenty of fruits and vegetables.  Drinking enough fluids to keep your urine clear or pale yellow. Aim to drink 6-8 glasses of water per day.  Using a stool softener as directed by your health care provider.  Lose weight, if you are overweight.  Do not use any tobacco products, including cigarettes, chewing tobacco, or electronic cigarettes. If you need help quitting, ask your health care provider.  Keep all follow-up visits as directed by your health care provider. This is important. Your health care provider may need to monitor your condition. SEEK MEDICAL CARE IF:  You have swelling, redness, and pain in the affected area.  Your bowel habits change. SEEK IMMEDIATE MEDICAL CARE IF:  You have a fever.  You have abdominal pain that is getting worse.  You feel nauseous or you vomit.  You cannot push the hernia back in place by gently pressing on it while you are lying down.  The hernia:  Changes in shape or size.  Is  stuck outside the abdomen.  Becomes discolored.  Feels hard or tender.   This information is not intended to replace advice given to you by your health care provider. Make sure you discuss any questions you have with your health care provider.   Document Released: 04/11/2005 Document Revised: 05/02/2014 Document Reviewed: 02/19/2014 Elsevier Interactive Patient Education [employer].

## 2015-09-03 NOTE — ED Provider Notes (Signed)
CSN: [id number]     Arrival date & time 09/03/15  [id number] History   First MD Initiated Contact with Patient 09/03/15 0930     Chief Complaint  Patient presents with  . Abdominal Pain     (Consider location/radiation/quality/duration/timing/severity/associated sxs/prior Treatment) HPI Patient reports he's had an abdominal wall hernia for quite some time. He comes in and out. He reports right now it is not out. Usually he can push it back in. He reports when it comes out though does cause him a lot of pain. No vomiting no diarrhea. He reports he's had 2 prior surgical repairs not. He reports he is not due to be seen by his surgeon again until next month. Incidentally noted during exam, patient has a area of erythema and ruptured abscess on his lower abdominal wall. He reports this is been there for several days. He states there is some discomfort associated. He denies that he does his insulin injections in his abdomen. No fever no chills Past Medical History  Diagnosis Date  . Hypertension   . Diabetes mellitus    Past Surgical History  Procedure Laterality Date  . Appendectomy    . Hernia repair    . Carpal tunnel release     History reviewed. No pertinent family history. Social History  Substance Use Topics  . Smoking status: Never Smoker   . Smokeless tobacco: Never Used  . Alcohol Use: No    Review of Systems 10 Systems reviewed and are negative for acute change except as noted in the HPI.    Allergies  Percocet  Home Medications   Prior to Admission medications   Medication Sig Start Date End Date Taking? Authorizing Provider  amLODipine (NORVASC) 5 MG tablet Take 5 mg by mouth daily.    Historical Provider, MD  carvedilol (COREG) 6.25 MG tablet Take 6.25 mg by mouth 2 (two) times daily with a meal.    Historical Provider, MD  cephALEXin (KEFLEX) 500 MG capsule Take 2 capsules (1,000 mg total) by mouth 2 (two) times daily. 09/03/15   Charlesetta Shanks, MD  citalopram  (CELEXA) 10 MG tablet Take 10 mg by mouth daily.    Historical Provider, MD  Ergocalciferol (VITAMIN D2 PO) Take by mouth.    Historical Provider, MD  glipiZIDE (GLUCOTROL) 10 MG tablet Take 10 mg by mouth daily before breakfast.    Historical Provider, MD  insulin glargine (LANTUS) 100 UNIT/ML injection Inject 40 Units into the skin at bedtime.    Historical Provider, MD  lisinopril-hydrochlorothiazide (PRINZIDE,ZESTORETIC) 20-25 MG per tablet Take 1 tablet by mouth daily.    Historical Provider, MD  metFORMIN (GLUCOPHAGE) 500 MG tablet Take 500 mg by mouth 2 (two) times daily with a meal.    Historical Provider, MD  ondansetron (ZOFRAN) 4 MG tablet Take 1 tablet (4 mg total) by mouth every 6 (six) hours. 05/23/15   Alfonzo Beers, MD  potassium chloride SA (K-DUR,KLOR-CON) 20 MEQ tablet Take 20 mEq by mouth 2 (two) times daily.    Historical Provider, MD  promethazine (PHENERGAN) 25 MG suppository Place 1 suppository (25 mg total) rectally every 6 (six) hours as needed for nausea or vomiting. 05/23/15   Alfonzo Beers, MD  sertraline (ZOLOFT) 50 MG tablet Take 50 mg by mouth daily.    Historical Provider, MD  traZODone (DESYREL) 50 MG tablet Take 50 mg by mouth at bedtime.    Historical Provider, MD   BP 154/94 mmHg  Pulse 75  Temp(Src) 98.2 F (  36.8 C) (Oral)  Resp 18  Ht 5\' 8"  (1.727 m)  Wt 319 lb (144.697 kg)  BMI 48.51 kg/m2  SpO2 97% Physical Exam  Constitutional: He is oriented to person, place, and time.  Patient is alert and nontoxic. He is well in appearance. Patient is morbidly obese.  HENT:  Head: Normocephalic and atraumatic.  Cardiovascular: Normal rate, regular rhythm, normal heart sounds and intact distal pulses.   Pulmonary/Chest: Effort normal and breath sounds normal.  Abdominal:  Patient has well healed but scarred surgical incision in the midline in the supraumbilical region. In supine position, there is no palpable mass at this time. Patient denies that his hernia is  protruded at this time. Did have him transition to standing position, also hernia has not eventrated in upright position either. Notably, the patient has an approximate 10 x 8 cm of erythema on the lower, right abdominal wall. Central to this is a small opening consistent with a ruptured abscess. At this time there is no longer central fluctuance  Musculoskeletal: Normal range of motion.  Neurological: He is alert and oriented to person, place, and time. Coordination normal.  Skin: Skin is warm and dry.  Psychiatric: He has a normal mood and affect.    ED Course  Procedures (including critical care time) Labs Review Labs Reviewed  URINALYSIS, ROUTINE W REFLEX MICROSCOPIC (NOT AT [facility])    Imaging Review No results found. I have personally reviewed and evaluated these images and lab results as part of my medical decision-making.   EKG Interpretation None      MDM   Final diagnoses:  Ventral hernia without obstruction or gangrene  Abdominal wall cellulitis   Patient's abdominal wall hernia is stable. He is counseled on weight loss as an important treatment for management of ventral hernia that has failed two prior repairs. Incidentally identified is abdominal wall cellulitis with a spontaneously ruptured abscess. At the abscess is ruptured and he continues to have approximately 10 cm of associated erythema consistent with abdominal wall cellulitis, patient will be started on Keflex. He is counseled on the importance of follow-up with his family doctor for both management of his underlying medical conditions as well as recheck of cellulitis.    Charlesetta Shanks, MD 09/03/15 [phone]

## 2016-03-24 DIAGNOSIS — M25561 Pain in right knee: Secondary | ICD-10-CM | POA: Insufficient documentation

## 2016-03-24 DIAGNOSIS — G8929 Other chronic pain: Secondary | ICD-10-CM | POA: Insufficient documentation

## 2016-10-19 ENCOUNTER — Emergency Department: Payer: Medicaid Other

## 2016-10-19 ENCOUNTER — Encounter: Payer: Self-pay | Admitting: Emergency Medicine

## 2016-10-19 DIAGNOSIS — R079 Chest pain, unspecified: Secondary | ICD-10-CM | POA: Insufficient documentation

## 2016-10-19 DIAGNOSIS — Z5321 Procedure and treatment not carried out due to patient leaving prior to being seen by health care provider: Secondary | ICD-10-CM | POA: Insufficient documentation

## 2016-10-19 LAB — CBC
HEMATOCRIT: 39.8 % — AB (ref 40.0–52.0)
Hemoglobin: 14 g/dL (ref 13.0–18.0)
MCH: 30 pg (ref 26.0–34.0)
MCHC: 35.1 g/dL (ref 32.0–36.0)
MCV: 85.2 fL (ref 80.0–100.0)
Platelets: 263 10*3/uL (ref 150–440)
RBC: 4.67 MIL/uL (ref 4.40–5.90)
RDW: 12.8 % (ref 11.5–14.5)
WBC: 8.5 10*3/uL (ref 3.8–10.6)

## 2016-10-19 LAB — BASIC METABOLIC PANEL
Anion gap: 6 (ref 5–15)
BUN: 9 mg/dL (ref 6–20)
CHLORIDE: 104 mmol/L (ref 101–111)
CO2: 27 mmol/L (ref 22–32)
Calcium: 9.5 mg/dL (ref 8.9–10.3)
Creatinine, Ser: 0.91 mg/dL (ref 0.61–1.24)
GFR calc Af Amer: >60 mL/min (ref >60)
GFR calc non Af Amer: >60 mL/min (ref >60)
GLUCOSE: 281 mg/dL — AB (ref 65–99)
Potassium: 3.4 mmol/L — ABNORMAL LOW (ref 3.5–5.1)
Sodium: 137 mmol/L (ref 135–145)

## 2016-10-19 LAB — TROPONIN I: Troponin I: <0.03 ng/mL (ref <0.03)

## 2016-10-19 NOTE — ED Triage Notes (Signed)
Patient ambulatory to triage with steady gait, without difficulty or distress noted; pt reports mid CP radiating to left side and down left arm accomp by [facility] x 3 days; denies hx of same

## 2016-10-20 ENCOUNTER — Emergency Department (HOSPITAL_BASED_OUTPATIENT_CLINIC_OR_DEPARTMENT_OTHER)
Admission: EM | Admit: 2016-10-20 | Discharge: 2016-10-20 | Disposition: A | Discharge status: Home / Self Care | Payer: Medicaid Other | Attending: Emergency Medicine | Admitting: Emergency Medicine

## 2016-10-20 ENCOUNTER — Telehealth: Payer: Self-pay | Admitting: Emergency Medicine

## 2016-10-20 ENCOUNTER — Encounter (HOSPITAL_BASED_OUTPATIENT_CLINIC_OR_DEPARTMENT_OTHER): Payer: Self-pay | Admitting: *Deleted

## 2016-10-20 ENCOUNTER — Emergency Department (HOSPITAL_BASED_OUTPATIENT_CLINIC_OR_DEPARTMENT_OTHER): Payer: Medicaid Other

## 2016-10-20 ENCOUNTER — Emergency Department
Admission: EM | Admit: 2016-10-20 | Discharge: 2016-10-20 | Disposition: A | Discharge status: Home / Self Care | Payer: Medicaid Other | Attending: Emergency Medicine | Admitting: Emergency Medicine

## 2016-10-20 DIAGNOSIS — I10 Essential (primary) hypertension: Secondary | ICD-10-CM | POA: Insufficient documentation

## 2016-10-20 DIAGNOSIS — R0789 Other chest pain: Secondary | ICD-10-CM | POA: Diagnosis not present

## 2016-10-20 DIAGNOSIS — E119 Type 2 diabetes mellitus without complications: Secondary | ICD-10-CM | POA: Insufficient documentation

## 2016-10-20 DIAGNOSIS — Z79899 Other long term (current) drug therapy: Secondary | ICD-10-CM | POA: Diagnosis not present

## 2016-10-20 DIAGNOSIS — R519 Headache, unspecified: Secondary | ICD-10-CM

## 2016-10-20 DIAGNOSIS — Z7984 Long term (current) use of oral hypoglycemic drugs: Secondary | ICD-10-CM | POA: Insufficient documentation

## 2016-10-20 DIAGNOSIS — Z794 Long term (current) use of insulin: Secondary | ICD-10-CM | POA: Diagnosis not present

## 2016-10-20 DIAGNOSIS — R739 Hyperglycemia, unspecified: Secondary | ICD-10-CM

## 2016-10-20 DIAGNOSIS — R51 Headache: Secondary | ICD-10-CM | POA: Insufficient documentation

## 2016-10-20 LAB — CBC WITH DIFFERENTIAL/PLATELET
Basophils Absolute: 0 10*3/uL (ref 0.0–0.1)
Basophils Relative: 0 %
EOS ABS: 0.1 10*3/uL (ref 0.0–0.7)
Eosinophils Relative: 2 %
HEMATOCRIT: 38.4 % — AB (ref 39.0–52.0)
HEMOGLOBIN: 13.7 g/dL (ref 13.0–17.0)
LYMPHS ABS: 2 10*3/uL (ref 0.7–4.0)
LYMPHS PCT: 28 %
MCH: 30.4 pg (ref 26.0–34.0)
MCHC: 35.7 g/dL (ref 30.0–36.0)
MCV: 85.3 fL (ref 78.0–100.0)
Monocytes Absolute: 0.7 10*3/uL (ref 0.1–1.0)
Monocytes Relative: 9 %
NEUTROS ABS: 4.4 10*3/uL (ref 1.7–7.7)
NEUTROS PCT: 61 %
Platelets: 256 10*3/uL (ref 150–400)
RBC: 4.5 MIL/uL (ref 4.22–5.81)
RDW: 12 % (ref 11.5–15.5)
WBC: 7.2 10*3/uL (ref 4.0–10.5)

## 2016-10-20 LAB — BASIC METABOLIC PANEL
Anion gap: 9 (ref 5–15)
BUN: 7 mg/dL (ref 6–20)
CHLORIDE: 100 mmol/L — AB (ref 101–111)
CO2: 25 mmol/L (ref 22–32)
Calcium: 8.9 mg/dL (ref 8.9–10.3)
Creatinine, Ser: 0.9 mg/dL (ref 0.61–1.24)
GFR calc Af Amer: >60 mL/min (ref >60)
GFR calc non Af Amer: >60 mL/min (ref >60)
Glucose, Bld: 426 mg/dL — ABNORMAL HIGH (ref 65–99)
POTASSIUM: 3.4 mmol/L — AB (ref 3.5–5.1)
Sodium: 134 mmol/L — ABNORMAL LOW (ref 135–145)

## 2016-10-20 MED ORDER — IOPAMIDOL (ISOVUE-370) INJECTION 76%
100.0000 mL | Freq: Once | INTRAVENOUS | Status: AC | PRN
  Administered 2016-10-20: 100 mL via INTRAVENOUS

## 2016-10-20 MED ORDER — HYDROCODONE-ACETAMINOPHEN 5-325 MG PO TABS
2.0000 | ORAL_TABLET | Freq: Once | ORAL | Status: AC
  Administered 2016-10-20: 2 via ORAL
  Filled 2016-10-20: qty 2

## 2016-10-20 NOTE — ED Triage Notes (Signed)
He went to the ED in [place] but left before being seen. He went for pain in his left chest and left neck with radiation into his left arm. He has had blurred vision and problems with his left eye x 2 days. Wife states she has noticed his speech has been slurred for the past 2 days.

## 2016-10-20 NOTE — Telephone Encounter (Signed)
Called patient due to lwot to inquire about condition and follow up plans. He says he continues to have pain. Has just left work.  Says he called his doctor, and they told him to return to the ED. He will be returning.

## 2016-10-20 NOTE — ED Provider Notes (Signed)
[facility] DEPT [facility] Provider Note   CSN: [id number] Arrival date & time: 10/20/16  2127  By signing my name below, I, Christopher Roberson, attest that this documentation has been prepared under the direction and in the presence of Christopher Furry, MD. Electronically signed, Christopher Roberson, ED Scribe. 10/20/16. 9:54 PM.  History   Chief Complaint Chief Complaint  Patient presents with  . Chest Pain  . Weakness    HPI HPI Comments: Christopher Roberson is a 41 y.o. male who presents to the Emergency Department complaining of gradually worsening chest pain which started 3 days ago. Yesterday his pain worsened so he called his PCP who instructed him to come to the ED. He went to the ED in [place] but left without being seen. He presents back here tonight with same symptoms. He describes his chest pain as waxing and waning, radiating up his neck and to his left shoulder/arm. He reports an exacerbation of his chest pain when moving his left arm. Pt also reports blurry vision out of his left eye, no problems with his right eye. Pt had a cardiac cath done last year but no stent was placed. No medications taken PTA.  The history is provided by the patient. No language interpreter was used.    Past Medical History:  Diagnosis Date  . Diabetes mellitus   . Hypertension     There are no active problems to display for this patient.   Past Surgical History:  Procedure Laterality Date  . APPENDECTOMY    . CARPAL TUNNEL RELEASE    . HERNIA REPAIR         Home Medications    Prior to Admission medications   Medication Sig Start Date End Date Taking? Authorizing Provider  amLODipine (NORVASC) 5 MG tablet Take 5 mg by mouth daily.   Yes [provider]  carvedilol (COREG) 6.25 MG tablet Take 6.25 mg by mouth 2 (two) times daily with a meal.   Yes [provider]  citalopram (CELEXA) 10 MG tablet Take 10 mg by mouth daily.   Yes [provider]  Ergocalciferol (VITAMIN D2 PO)  Take by mouth.   Yes [provider]  glipiZIDE (GLUCOTROL) 10 MG tablet Take 10 mg by mouth daily before breakfast.   Yes [provider]  insulin glargine (LANTUS) 100 UNIT/ML injection Inject 40 Units into the skin at bedtime.   Yes [provider]  lisinopril-hydrochlorothiazide (PRINZIDE,ZESTORETIC) 20-25 MG per tablet Take 1 tablet by mouth daily.   Yes [provider]  metFORMIN (GLUCOPHAGE) 500 MG tablet Take 500 mg by mouth 2 (two) times daily with a meal.   Yes [provider]  potassium chloride SA (K-DUR,KLOR-CON) 20 MEQ tablet Take 20 mEq by mouth 2 (two) times daily.   Yes [provider]  sertraline (ZOLOFT) 50 MG tablet Take 50 mg by mouth daily.   Yes [provider]  traZODone (DESYREL) 50 MG tablet Take 50 mg by mouth at bedtime.   Yes [provider]  cephALEXin (KEFLEX) 500 MG capsule Take 2 capsules (1,000 mg total) by mouth 2 (two) times daily. 09/03/15   Charlesetta Shanks, MD  ondansetron (ZOFRAN) 4 MG tablet Take 1 tablet (4 mg total) by mouth every 6 (six) hours. 05/23/15   Alfonzo Beers, MD  promethazine (PHENERGAN) 25 MG suppository Place 1 suppository (25 mg total) rectally every 6 (six) hours as needed for nausea or vomiting. 05/23/15   Alfonzo Beers, MD    Family History  No family history on file.  Social History Social History  Substance Use Topics  . Smoking status: Never Smoker  . Smokeless tobacco: Never Used  . Alcohol use No     Allergies   Percocet [oxycodone-acetaminophen]   Review of Systems Review of Systems  Constitutional: Negative for appetite change, chills, diaphoresis, fatigue and fever.  HENT: Negative for mouth sores, sore throat and trouble swallowing.   Eyes: Positive for visual disturbance (left eye "blurry").  Respiratory: Negative for cough, chest tightness and wheezing.   Gastrointestinal: Negative for abdominal distention, abdominal pain, diarrhea and nausea.   Endocrine: Negative for polydipsia, polyphagia and polyuria.  Genitourinary: Negative for dysuria, frequency and hematuria.  Musculoskeletal: Negative for gait problem.  Skin: Negative for color change, pallor and rash.  Neurological: Negative for syncope and light-headedness.  Hematological: Does not bruise/bleed easily.  Psychiatric/Behavioral: Negative for behavioral problems.  All other systems reviewed and are negative.    Physical Exam Updated Vital Signs BP (!) 184/103   Pulse 80   Temp 98.5 F (36.9 C) (Oral)   Resp 18   Ht 5\' 8"  (1.727 m)   Wt (!) 138.3 kg (305 lb)   SpO2 97%   BMI 46.38 kg/m   Physical Exam  Constitutional: He is oriented to person, place, and time. He appears well-developed and well-nourished. No distress.  HENT:  Head: Normocephalic.  Eyes: Conjunctivae are normal. Pupils are equal, round, and reactive to light. No scleral icterus.  Neck: Normal range of motion. Neck supple. No thyromegaly present.  Slight tenderness in lt lateral neck, not specifically over carotid.   Cardiovascular: Normal rate and regular rhythm.  Exam reveals no gallop and no friction rub.   No murmur heard. Pulmonary/Chest: Effort normal and breath sounds normal. No respiratory distress. He has no wheezes. He has no rales. He exhibits tenderness.  Mild tenderness over left chest wall  Abdominal: Soft. Bowel sounds are normal. He exhibits no distension. There is no tenderness. There is no rebound.  Musculoskeletal: Normal range of motion.  Pain with movement of left arm  Neurological: He is alert and oriented to person, place, and time.  Skin: Skin is warm and dry. No rash noted.  Psychiatric: He has a normal mood and affect. His behavior is normal.     ED Treatments / Results  DIAGNOSTIC STUDIES: Oxygen Saturation is 97% on RA, normal by my interpretation.  COORDINATION OF CARE: 9:50 PM-Discussed treatment plan with pt at bedside and pt agreed to plan.    Labs (all labs ordered are listed, but only abnormal results are displayed) Labs Reviewed  CBC WITH DIFFERENTIAL/PLATELET - Abnormal; Notable for the following:       Result Value   HCT 38.4 (*)    All other components within normal limits  BASIC METABOLIC PANEL - Abnormal; Notable for the following:    Sodium 134 (*)    Potassium 3.4 (*)    Chloride 100 (*)    Glucose, Bld 426 (*)    All other components within normal limits  TROPONIN I    EKG  EKG Interpretation None       Radiology Dg Chest 2 View  Result Date: 10/19/2016 CLINICAL DATA:  41 y/o M; chest pain radiating to the left arm with shortness of breath. EXAM: CHEST  2 VIEW COMPARISON:  None. FINDINGS: The heart size and mediastinal contours are within normal limits. Both lungs are clear. Mild degenerative changes of the thoracic spine. IMPRESSION: No acute pulmonary  process identified. Electronically Signed   By: Kristine Garbe M.D.   On: 10/19/2016 23:15   Ct Head Wo Contrast  Result Date: 10/20/2016 CLINICAL DATA:  Initial evaluation for acute left arm weakness, left-sided neck pain. Evaluate for dissection. EXAM: CT HEAD WITHOUT CONTRAST CT ANGIOGRAPHY NECK TECHNIQUE: Multidetector CT imaging of the neck was performed using the standard protocol during bolus administration of intravenous contrast. Multiplanar CT image reconstructions and MIPs were obtained to evaluate the vascular anatomy. Carotid stenosis measurements (when applicable) are obtained utilizing NASCET criteria, using the distal internal carotid diameter as the denominator. CONTRAST:  100 CC of Isovue 370 COMPARISON:  None. FINDINGS: CT HEAD: Brain: Cerebral volume within normal limits for patient age. No evidence for acute intracranial hemorrhage. No findings to suggest acute large vessel territory infarct. No mass lesion, midline shift, or mass effect. Ventricles are normal in size without evidence for hydrocephalus. No extra-axial fluid  collection identified. Vascular: No hyperdense vessel identified. Skull: Scalp soft tissues demonstrate no acute abnormality.Calvarium intact. Sinuses/Orbits: Globes and orbital soft tissues are within normal limits. Visualized paranasal sinuses are clear. No mastoid effusion. CTA NECK: Aortic arch: Visualized aortic arch of normal caliber with normal branch pattern. No significant atheromatous plaque within the arch itself. No flow-limiting stenosis about the origin of the great vessels. Visualized subclavian artery is grossly unremarkable, although poorly evaluated on this exam due to body habitus. Right carotid system: Right common carotid artery not well evaluated proximally due to body habitus. Visualize right common carotid artery widely patent to the bifurcation without acute abnormality. No significant atheromatous narrowing about the right carotid bifurcation. Right ICA patent distally to the circle of Willis without stenosis, dissection, or occlusion. Left carotid system: Left common carotid artery patent from its origin to the bifurcation without stenosis or acute abnormality. Minimal plaque at the proximal left ICA without stenosis. Left ICA widely patent distally to the circle of Willis without stenosis, dissection, or occlusion. Vertebral arteries: Both of the vertebral arteries arise from the subclavian arteries. Vertebral arteries not well evaluated proximally. Vertebral arteries widely patent within the neck without stenosis, dissection, or occlusion. Globular blush of contrast adjacent to the left P3 segment felt to be venous in nature. Skeleton: No acute osseous abnormality. No worrisome lytic or blastic osseous lesions. Os odontoid noted. Other neck: No acute soft tissue abnormality within the neck. Salivary glands normal. No adenopathy. Thyroid normal. Upper chest: Visualized upper chest grossly unremarkable. Partially visualized lungs are clear. IMPRESSION: 1. No acute intracranial process  identified. 2. Normal CTA of the neck. No evidence for dissection or other acute vascular abnormality. No stenosis. Electronically Signed   By: Jeannine Boga M.D.   On: 10/20/2016 23:29   Ct Angio Neck W And/or Wo Contrast  Result Date: 10/20/2016 CLINICAL DATA:  Initial evaluation for acute left arm weakness, left-sided neck pain. Evaluate for dissection. EXAM: CT HEAD WITHOUT CONTRAST CT ANGIOGRAPHY NECK TECHNIQUE: Multidetector CT imaging of the neck was performed using the standard protocol during bolus administration of intravenous contrast. Multiplanar CT image reconstructions and MIPs were obtained to evaluate the vascular anatomy. Carotid stenosis measurements (when applicable) are obtained utilizing NASCET criteria, using the distal internal carotid diameter as the denominator. CONTRAST:  100 CC of Isovue 370 COMPARISON:  None. FINDINGS: CT HEAD: Brain: Cerebral volume within normal limits for patient age. No evidence for acute intracranial hemorrhage. No findings to suggest acute large vessel territory infarct. No mass lesion, midline shift, or mass effect. Ventricles are normal  in size without evidence for hydrocephalus. No extra-axial fluid collection identified. Vascular: No hyperdense vessel identified. Skull: Scalp soft tissues demonstrate no acute abnormality.Calvarium intact. Sinuses/Orbits: Globes and orbital soft tissues are within normal limits. Visualized paranasal sinuses are clear. No mastoid effusion. CTA NECK: Aortic arch: Visualized aortic arch of normal caliber with normal branch pattern. No significant atheromatous plaque within the arch itself. No flow-limiting stenosis about the origin of the great vessels. Visualized subclavian artery is grossly unremarkable, although poorly evaluated on this exam due to body habitus. Right carotid system: Right common carotid artery not well evaluated proximally due to body habitus. Visualize right common carotid artery widely patent to the  bifurcation without acute abnormality. No significant atheromatous narrowing about the right carotid bifurcation. Right ICA patent distally to the circle of Willis without stenosis, dissection, or occlusion. Left carotid system: Left common carotid artery patent from its origin to the bifurcation without stenosis or acute abnormality. Minimal plaque at the proximal left ICA without stenosis. Left ICA widely patent distally to the circle of Willis without stenosis, dissection, or occlusion. Vertebral arteries: Both of the vertebral arteries arise from the subclavian arteries. Vertebral arteries not well evaluated proximally. Vertebral arteries widely patent within the neck without stenosis, dissection, or occlusion. Globular blush of contrast adjacent to the left P3 segment felt to be venous in nature. Skeleton: No acute osseous abnormality. No worrisome lytic or blastic osseous lesions. Os odontoid noted. Other neck: No acute soft tissue abnormality within the neck. Salivary glands normal. No adenopathy. Thyroid normal. Upper chest: Visualized upper chest grossly unremarkable. Partially visualized lungs are clear. IMPRESSION: 1. No acute intracranial process identified. 2. Normal CTA of the neck. No evidence for dissection or other acute vascular abnormality. No stenosis. Electronically Signed   By: Jeannine Boga M.D.   On: 10/20/2016 23:29    Procedures Procedures (including critical care time)  Medications Ordered in ED Medications  HYDROcodone-acetaminophen (NORCO/VICODIN) 5-325 MG per tablet 2 tablet (not administered)  iopamidol (ISOVUE-370) 76 % injection 100 mL (100 mLs Intravenous Contrast Given 10/20/16 2232)     Initial Impression / Assessment and Plan / ED Course  I have reviewed the triage vital signs and the nursing notes.  Pertinent labs & imaging results that were available during my care of the patient were reviewed by me and considered in my medical decision making (see chart  for details).     CTA of the neck obtained rules out carotid or vertebral dissection. Troponin normal last night, and repeat normal tonight. No EKG evolution. Symptoms are reproducible with movement or palpation. Given simple pain medication. His vision change or likely due to his persistent hyperglycemia. I recommended plans with diet, medications, and primary care follow-up.  Final Clinical Impressions(s) / ED Diagnoses   Final diagnoses:  Chest wall pain  Nonintractable headache, unspecified chronicity pattern, unspecified headache type  Hyperglycemia    New Prescriptions New Prescriptions   No medications on file  I personally performed the services described in this documentation, which was scribed in my presence. The recorded information has been reviewed and is accurate.     Christopher Furry, MD 10/20/16 [phone]

## 2016-10-20 NOTE — Discharge Instructions (Signed)
Follow-up with your primary care physician. Your studies tonight do not indicate signs of heart attack, stroke, or vascular abnormality of your neck. Your blood sugars are consistently high, this can lead to blurry vision.

## 2016-10-31 DIAGNOSIS — F332 Major depressive disorder, recurrent severe without psychotic features: Secondary | ICD-10-CM | POA: Insufficient documentation

## 2016-10-31 DIAGNOSIS — T50902A Poisoning by unspecified drugs, medicaments and biological substances, intentional self-harm, initial encounter: Secondary | ICD-10-CM | POA: Insufficient documentation

## 2016-11-01 DIAGNOSIS — F1010 Alcohol abuse, uncomplicated: Secondary | ICD-10-CM | POA: Insufficient documentation

## 2017-02-16 DIAGNOSIS — G8929 Other chronic pain: Secondary | ICD-10-CM | POA: Insufficient documentation

## 2017-02-21 DIAGNOSIS — M5414 Radiculopathy, thoracic region: Secondary | ICD-10-CM | POA: Insufficient documentation

## 2017-04-25 ENCOUNTER — Encounter (HOSPITAL_BASED_OUTPATIENT_CLINIC_OR_DEPARTMENT_OTHER): Payer: Self-pay | Admitting: Emergency Medicine

## 2017-04-25 ENCOUNTER — Emergency Department (HOSPITAL_BASED_OUTPATIENT_CLINIC_OR_DEPARTMENT_OTHER)
Admission: EM | Admit: 2017-04-25 | Discharge: 2017-04-25 | Disposition: A | Discharge status: Home / Self Care | Payer: Medicaid Other | Attending: Emergency Medicine | Admitting: Emergency Medicine

## 2017-04-25 ENCOUNTER — Other Ambulatory Visit: Payer: Self-pay

## 2017-04-25 DIAGNOSIS — R252 Cramp and spasm: Secondary | ICD-10-CM

## 2017-04-25 DIAGNOSIS — I10 Essential (primary) hypertension: Secondary | ICD-10-CM | POA: Diagnosis not present

## 2017-04-25 DIAGNOSIS — R739 Hyperglycemia, unspecified: Secondary | ICD-10-CM

## 2017-04-25 DIAGNOSIS — E1165 Type 2 diabetes mellitus with hyperglycemia: Secondary | ICD-10-CM | POA: Insufficient documentation

## 2017-04-25 DIAGNOSIS — Z79899 Other long term (current) drug therapy: Secondary | ICD-10-CM | POA: Insufficient documentation

## 2017-04-25 DIAGNOSIS — R748 Abnormal levels of other serum enzymes: Secondary | ICD-10-CM | POA: Diagnosis not present

## 2017-04-25 DIAGNOSIS — Z794 Long term (current) use of insulin: Secondary | ICD-10-CM | POA: Insufficient documentation

## 2017-04-25 DIAGNOSIS — R109 Unspecified abdominal pain: Secondary | ICD-10-CM | POA: Insufficient documentation

## 2017-04-25 LAB — URINALYSIS, ROUTINE W REFLEX MICROSCOPIC
Bilirubin Urine: NEGATIVE
Glucose, UA: >=500 mg/dL — AB
Hgb urine dipstick: NEGATIVE
Ketones, ur: NEGATIVE mg/dL
LEUKOCYTES UA: NEGATIVE
Nitrite: NEGATIVE
PH: 7 (ref 5.0–8.0)
Protein, ur: NEGATIVE mg/dL
SPECIFIC GRAVITY, URINE: 1.02 (ref 1.005–1.030)

## 2017-04-25 LAB — COMPREHENSIVE METABOLIC PANEL WITH GFR
ALT: 33 U/L (ref 17–63)
AST: 43 U/L — ABNORMAL HIGH (ref 15–41)
Albumin: 4.1 g/dL (ref 3.5–5.0)
Alkaline Phosphatase: 63 U/L (ref 38–126)
Anion gap: 12 (ref 5–15)
BUN: 14 mg/dL (ref 6–20)
CO2: 24 mmol/L (ref 22–32)
Calcium: 9.3 mg/dL (ref 8.9–10.3)
Chloride: 94 mmol/L — ABNORMAL LOW (ref 101–111)
Creatinine, Ser: 0.99 mg/dL (ref 0.61–1.24)
GFR calc Af Amer: >60 mL/min
GFR calc non Af Amer: >60 mL/min
Glucose, Bld: 411 mg/dL — ABNORMAL HIGH (ref 65–99)
Potassium: 3.9 mmol/L (ref 3.5–5.1)
Sodium: 130 mmol/L — ABNORMAL LOW (ref 135–145)
Total Bilirubin: 1.3 mg/dL — ABNORMAL HIGH (ref 0.3–1.2)
Total Protein: 7.6 g/dL (ref 6.5–8.1)

## 2017-04-25 LAB — CBG MONITORING, ED: GLUCOSE-CAPILLARY: 283 mg/dL — AB (ref 65–99)

## 2017-04-25 LAB — CBC WITH DIFFERENTIAL/PLATELET
BASOS ABS: 0 10*3/uL (ref 0.0–0.1)
BASOS PCT: 1 %
Eosinophils Absolute: 0 10*3/uL (ref 0.0–0.7)
Eosinophils Relative: 1 %
HEMATOCRIT: 41.2 % (ref 39.0–52.0)
HEMOGLOBIN: 14.2 g/dL (ref 13.0–17.0)
LYMPHS PCT: 24 %
Lymphs Abs: 2.1 10*3/uL (ref 0.7–4.0)
MCH: 29.8 pg (ref 26.0–34.0)
MCHC: 34.5 g/dL (ref 30.0–36.0)
MCV: 86.6 fL (ref 78.0–100.0)
Monocytes Absolute: 0.8 10*3/uL (ref 0.1–1.0)
Monocytes Relative: 9 %
NEUTROS ABS: 5.9 10*3/uL (ref 1.7–7.7)
NEUTROS PCT: 67 %
Platelets: 281 10*3/uL (ref 150–400)
RBC: 4.76 MIL/uL (ref 4.22–5.81)
RDW: 12 % (ref 11.5–15.5)
WBC: 8.9 10*3/uL (ref 4.0–10.5)

## 2017-04-25 LAB — URINALYSIS, MICROSCOPIC (REFLEX): RBC / HPF: NONE SEEN RBC/hpf (ref 0–5)

## 2017-04-25 LAB — CK: Total CK: 1218 U/L — ABNORMAL HIGH (ref 49–397)

## 2017-04-25 MED ORDER — SODIUM CHLORIDE 0.9 % IV BOLUS (SEPSIS)
2000.0000 mL | Freq: Once | INTRAVENOUS | Status: AC
  Administered 2017-04-25: 1000 mL via INTRAVENOUS

## 2017-04-25 MED ORDER — SODIUM CHLORIDE 0.9 % IV SOLN
Freq: Once | INTRAVENOUS | Status: AC
  Administered 2017-04-25: 1000 mL via INTRAVENOUS

## 2017-04-25 MED ORDER — SODIUM CHLORIDE 0.9 % IV BOLUS (SEPSIS)
1000.0000 mL | Freq: Once | INTRAVENOUS | Status: AC
  Administered 2017-04-25: 1000 mL via INTRAVENOUS

## 2017-04-25 NOTE — Discharge Instructions (Signed)
Your cramping is likely due to dehydration that is partly due to elevated blood sugar.  It is important for you to check your blood sugar regularly.  Avoid sugary drinks including soda.  Drink more water, at least 64 ounces daily.  Please follow-up with your doctor in the next week for reevaluation and diabetes medication management.  Return to ED for new or worsening symptoms as we discussed.

## 2017-04-25 NOTE — ED Triage Notes (Signed)
Reports cramping in all body parts  1-2 weeks

## 2017-04-25 NOTE — ED Provider Notes (Signed)
[facility] EMERGENCY DEPARTMENT Provider Note   CSN: [id number] Arrival date & time: 04/25/17  0854     History   Chief Complaint Chief Complaint  Patient presents with  . Abdominal Cramping    HPI Christopher Roberson is a 42 y.o. male.  HPI With history of insulin dependent diabetes, hypertension, here for evaluation of diffuse cramping.  Patient reports symptoms started roughly 3 weeks ago with bilateral cramping in his legs that will radiate into his abdomen.  Also reports cramping in his bilateral arms that cause him to drop things.  Symptoms will last from 3-5 minutes and spontaneously resolved.  Admits he has not had very much water to drink, drinks more soda.  Has followed up with his PCP for this problem and is scheduled to see orthopedics tomorrow.  Reports blood sugar has been running high "in the 200s".  Denies any fevers, chills, difficulties breathing or swallowing, abdominal pain, flank pain urinary symptoms, diarrhea or constipation, rash.  No new exercise regimens.  Nothing makes problem better or worse.  Has been doing 40 units of Lantus at night for the past 3 weeks. Past Medical History:  Diagnosis Date  . Diabetes mellitus   . Hypertension     There are no active problems to display for this patient.   Past Surgical History:  Procedure Laterality Date  . APPENDECTOMY    . CARPAL TUNNEL RELEASE    . HERNIA REPAIR         Home Medications    Prior to Admission medications   Medication Sig Start Date End Date Taking? Authorizing Provider  amLODipine (NORVASC) 5 MG tablet Take 5 mg by mouth daily.    [provider]  carvedilol (COREG) 6.25 MG tablet Take 6.25 mg by mouth 2 (two) times daily with a meal.    [provider]  cephALEXin (KEFLEX) 500 MG capsule Take 2 capsules (1,000 mg total) by mouth 2 (two) times daily. 09/03/15   Charlesetta Shanks, MD  citalopram (CELEXA) 10 MG tablet Take 10 mg by mouth daily.    [provider]  Ergocalciferol (VITAMIN D2 PO) Take by mouth.    [provider]  glipiZIDE (GLUCOTROL) 10 MG tablet Take 10 mg by mouth daily before breakfast.    [provider]  insulin glargine (LANTUS) 100 UNIT/ML injection Inject 40 Units into the skin at bedtime.    [provider]  lisinopril-hydrochlorothiazide (PRINZIDE,ZESTORETIC) 20-25 MG per tablet Take 1 tablet by mouth daily.    [provider]  metFORMIN (GLUCOPHAGE) 500 MG tablet Take 500 mg by mouth 2 (two) times daily with a meal.    [provider]  ondansetron (ZOFRAN) 4 MG tablet Take 1 tablet (4 mg total) by mouth every 6 (six) hours. 05/23/15   Mabe, Forbes Cellar, MD  potassium chloride SA (K-DUR,KLOR-CON) 20 MEQ tablet Take 20 mEq by mouth 2 (two) times daily.    [provider]  promethazine (PHENERGAN) 25 MG suppository Place 1 suppository (25 mg total) rectally every 6 (six) hours as needed for nausea or vomiting. 05/23/15   Mabe, Forbes Cellar, MD  sertraline (ZOLOFT) 50 MG tablet Take 50 mg by mouth daily.    [provider]  traZODone (DESYREL) 50 MG tablet Take 50 mg by mouth at bedtime.    [provider]    Family History No family history on file.  Social History Social History   Tobacco Use  . Smoking status: Never Smoker  .  Smokeless tobacco: Never Used  Substance Use Topics  . Alcohol use: No  . Drug use: No     Allergies   Percocet [oxycodone-acetaminophen]   Review of Systems Review of Systems See HPI  Physical Exam Updated Vital Signs BP (!) 138/93   Pulse 60   Temp 98.2 F (36.8 C) (Oral)   Resp 12   SpO2 97%   Physical Exam  Constitutional: He appears well-developed. No distress.  Awake, alert and nontoxic in appearance.  Obese African-American male  HENT:  Head: Normocephalic and atraumatic.  Right Ear: External ear normal.  Left Ear: External ear normal.  Nose: Nose normal.  Mouth/Throat: Oropharynx is clear  and moist. No oropharyngeal exudate.  Eyes: Conjunctivae and EOM are normal. Pupils are equal, round, and reactive to light.  Neck: Normal range of motion. No JVD present.  Cardiovascular: Normal rate, regular rhythm and normal heart sounds.  Pulmonary/Chest: Effort normal and breath sounds normal. No stridor.  Abdominal: Soft. He exhibits no distension and no mass. There is no tenderness. There is no rebound and no guarding.  Musculoskeletal: Normal range of motion. He exhibits no tenderness.  Full range of motion of all extremities.  Muscle compartments are all soft.  No tenderness.  No skin abnormalities.  Neurological:  Awake, alert, cooperative and aware of situation; motor strength bilaterally; sensation normal to light touch bilaterally; no facial asymmetry; tongue midline; major cranial nerves appear intact;  baseline gait without new ataxia.  Skin: No rash noted. He is not diaphoretic.  Psychiatric: He has a normal mood and affect. His behavior is normal. Thought content normal.  Nursing note and vitals reviewed.  Vitals:   04/25/17 1329 04/25/17 1536  BP: 123/84 (!) 138/93  Pulse: 74 60  Resp: 18 12  Temp:    SpO2: 98% 97%     ED Treatments / Results  Labs (all labs ordered are listed, but only abnormal results are displayed) Labs Reviewed  COMPREHENSIVE METABOLIC PANEL - Abnormal; Notable for the following components:      Result Value   Sodium 130 (*)    Chloride 94 (*)    Glucose, Bld 411 (*)    AST 43 (*)    Total Bilirubin 1.3 (*)    All other components within normal limits  URINALYSIS, ROUTINE W REFLEX MICROSCOPIC - Abnormal; Notable for the following components:   Glucose, UA >=500 (*)    All other components within normal limits  CK - Abnormal; Notable for the following components:   Total CK 1,218 (*)    All other components within normal limits  URINALYSIS, MICROSCOPIC (REFLEX) - Abnormal; Notable for the following components:   Bacteria, UA RARE (*)      Squamous Epithelial / LPF 0-5 (*)    All other components within normal limits  CBG MONITORING, ED - Abnormal; Notable for the following components:   Glucose-Capillary 283 (*)    All other components within normal limits  CBC WITH DIFFERENTIAL/PLATELET    EKG  EKG Interpretation None       Radiology No results found.  Procedures Procedures (including critical care time)  Medications Ordered in ED Medications  sodium chloride 0.9 % bolus 1,000 mL (0 mLs Intravenous Stopped 04/25/17 1207)  sodium chloride 0.9 % bolus 2,000 mL (0 mLs Intravenous Stopped 04/25/17 1314)  0.9 %  sodium chloride infusion ( Intravenous Stopped 04/25/17 1545)     Initial Impression / Assessment and Plan / ED Course  I have reviewed  the triage vital signs and the nursing notes.  Pertinent labs & imaging results that were available during my care of the patient were reviewed by me and considered in my medical decision making (see chart for details).  Clinical Course as of Apr 25 1601  Tue Apr 25, 2017  1106 CK Total: (!) 1,218 [BC]    Clinical Course User Index [BC] Comer Locket, PA-C    Insulin dependent diabetic presents for abdominal and muscle cramping ongoing over the past 3 weeks, worsening over the past 2 days.  His exam is reassuring.  He overall appears very well, hemodynamically stable, afebrile.  Labs show elevated glucose at 411, CK elevated at 1218.  No evidence of DKA.  Given 3 L normal saline and glucose improved to 283.  He reports that he feels much better.  Will follow up with his PCP next week for reevaluation and medication management.  Strict return precautions discussed with patient and wife at bedside they verbalized understanding, agree with plan and subsequent discharge. Prior to patient discharge, I discussed and reviewed this case with Dr.Plunkett     Final Clinical Impressions(s) / ED Diagnoses   Final diagnoses:  Muscle cramping  Abdominal cramping   Hyperglycemia  Elevated CK    ED Discharge Orders    None       Comer Locket, PA-C 04/25/17 1604    Blanchie Dessert, MD 04/26/17 2102

## 2017-07-20 DIAGNOSIS — E113493 Type 2 diabetes mellitus with severe nonproliferative diabetic retinopathy without macular edema, bilateral: Secondary | ICD-10-CM | POA: Insufficient documentation

## 2017-07-20 DIAGNOSIS — F411 Generalized anxiety disorder: Secondary | ICD-10-CM | POA: Insufficient documentation

## 2017-11-30 DIAGNOSIS — M533 Sacrococcygeal disorders, not elsewhere classified: Secondary | ICD-10-CM | POA: Insufficient documentation

## 2018-02-05 DIAGNOSIS — F4321 Adjustment disorder with depressed mood: Secondary | ICD-10-CM | POA: Insufficient documentation

## 2018-02-06 DIAGNOSIS — F331 Major depressive disorder, recurrent, moderate: Secondary | ICD-10-CM | POA: Insufficient documentation

## 2018-02-19 DIAGNOSIS — R310 Gross hematuria: Secondary | ICD-10-CM | POA: Insufficient documentation

## 2018-02-26 DIAGNOSIS — F524 Premature ejaculation: Secondary | ICD-10-CM | POA: Insufficient documentation

## 2018-03-19 DIAGNOSIS — H43393 Other vitreous opacities, bilateral: Secondary | ICD-10-CM | POA: Insufficient documentation

## 2018-03-19 DIAGNOSIS — H04123 Dry eye syndrome of bilateral lacrimal glands: Secondary | ICD-10-CM | POA: Insufficient documentation

## 2018-03-19 DIAGNOSIS — Z794 Long term (current) use of insulin: Secondary | ICD-10-CM | POA: Insufficient documentation

## 2018-05-09 DIAGNOSIS — Z8673 Personal history of transient ischemic attack (TIA), and cerebral infarction without residual deficits: Secondary | ICD-10-CM | POA: Diagnosis present

## 2018-05-09 DIAGNOSIS — G510 Bell's palsy: Secondary | ICD-10-CM | POA: Insufficient documentation

## 2018-08-27 DIAGNOSIS — Z79899 Other long term (current) drug therapy: Secondary | ICD-10-CM | POA: Insufficient documentation

## 2018-10-08 ENCOUNTER — Encounter (HOSPITAL_BASED_OUTPATIENT_CLINIC_OR_DEPARTMENT_OTHER): Payer: Self-pay | Admitting: *Deleted

## 2018-10-08 ENCOUNTER — Other Ambulatory Visit: Payer: Self-pay

## 2018-10-08 ENCOUNTER — Inpatient Hospital Stay (HOSPITAL_BASED_OUTPATIENT_CLINIC_OR_DEPARTMENT_OTHER)
Admission: EM | Admit: 2018-10-08 | Discharge: 2018-10-10 | DRG: 177 | Disposition: A | Discharge status: Home / Self Care | Payer: Medicaid Other | Attending: Internal Medicine | Admitting: Internal Medicine

## 2018-10-08 ENCOUNTER — Emergency Department (HOSPITAL_BASED_OUTPATIENT_CLINIC_OR_DEPARTMENT_OTHER): Payer: Medicaid Other

## 2018-10-08 DIAGNOSIS — E785 Hyperlipidemia, unspecified: Secondary | ICD-10-CM | POA: Diagnosis present

## 2018-10-08 DIAGNOSIS — J129 Viral pneumonia, unspecified: Secondary | ICD-10-CM

## 2018-10-08 DIAGNOSIS — I10 Essential (primary) hypertension: Secondary | ICD-10-CM

## 2018-10-08 DIAGNOSIS — Z6841 Body Mass Index (BMI) 40.0 and over, adult: Secondary | ICD-10-CM

## 2018-10-08 DIAGNOSIS — J9601 Acute respiratory failure with hypoxia: Secondary | ICD-10-CM | POA: Diagnosis present

## 2018-10-08 DIAGNOSIS — U071 COVID-19: Principal | ICD-10-CM

## 2018-10-08 DIAGNOSIS — R0602 Shortness of breath: Secondary | ICD-10-CM

## 2018-10-08 DIAGNOSIS — Z794 Long term (current) use of insulin: Secondary | ICD-10-CM

## 2018-10-08 DIAGNOSIS — J1289 Other viral pneumonia: Secondary | ICD-10-CM | POA: Diagnosis present

## 2018-10-08 DIAGNOSIS — Z8673 Personal history of transient ischemic attack (TIA), and cerebral infarction without residual deficits: Secondary | ICD-10-CM

## 2018-10-08 DIAGNOSIS — Z20822 Contact with and (suspected) exposure to covid-19: Secondary | ICD-10-CM | POA: Diagnosis present

## 2018-10-08 DIAGNOSIS — E119 Type 2 diabetes mellitus without complications: Secondary | ICD-10-CM | POA: Diagnosis present

## 2018-10-08 DIAGNOSIS — N400 Enlarged prostate without lower urinary tract symptoms: Secondary | ICD-10-CM | POA: Diagnosis present

## 2018-10-08 DIAGNOSIS — R0902 Hypoxemia: Secondary | ICD-10-CM

## 2018-10-08 DIAGNOSIS — E1165 Type 2 diabetes mellitus with hyperglycemia: Secondary | ICD-10-CM

## 2018-10-08 DIAGNOSIS — Z79899 Other long term (current) drug therapy: Secondary | ICD-10-CM

## 2018-10-08 DIAGNOSIS — A084 Viral intestinal infection, unspecified: Secondary | ICD-10-CM

## 2018-10-08 DIAGNOSIS — Z20828 Contact with and (suspected) exposure to other viral communicable diseases: Secondary | ICD-10-CM

## 2018-10-08 HISTORY — DX: Cerebral infarction, unspecified: I63.9

## 2018-10-08 LAB — COMPREHENSIVE METABOLIC PANEL
ALT: 38 U/L (ref 0–44)
AST: 30 U/L (ref 15–41)
Albumin: 3.9 g/dL (ref 3.5–5.0)
Alkaline Phosphatase: 49 U/L (ref 38–126)
Anion gap: 11 (ref 5–15)
BUN: 10 mg/dL (ref 6–20)
CO2: 26 mmol/L (ref 22–32)
Calcium: 9.1 mg/dL (ref 8.9–10.3)
Chloride: 98 mmol/L (ref 98–111)
Creatinine, Ser: 1.06 mg/dL (ref 0.61–1.24)
GFR calc Af Amer: >60 mL/min (ref >60)
GFR calc non Af Amer: >60 mL/min (ref >60)
Glucose, Bld: 212 mg/dL — ABNORMAL HIGH (ref 70–99)
Potassium: 3.6 mmol/L (ref 3.5–5.1)
Sodium: 135 mmol/L (ref 135–145)
Total Bilirubin: 0.8 mg/dL (ref 0.3–1.2)
Total Protein: 7.8 g/dL (ref 6.5–8.1)

## 2018-10-08 LAB — CBC WITH DIFFERENTIAL/PLATELET
Abs Immature Granulocytes: 0.05 10*3/uL (ref 0.00–0.07)
Basophils Absolute: 0 10*3/uL (ref 0.0–0.1)
Basophils Relative: 0 %
Eosinophils Absolute: 0 10*3/uL (ref 0.0–0.5)
Eosinophils Relative: 0 %
HCT: 41.8 % (ref 39.0–52.0)
Hemoglobin: 13.6 g/dL (ref 13.0–17.0)
Immature Granulocytes: 1 %
Lymphocytes Relative: 24 %
Lymphs Abs: 1.4 10*3/uL (ref 0.7–4.0)
MCH: 28.9 pg (ref 26.0–34.0)
MCHC: 32.5 g/dL (ref 30.0–36.0)
MCV: 88.7 fL (ref 80.0–100.0)
Monocytes Absolute: 0.5 10*3/uL (ref 0.1–1.0)
Monocytes Relative: 9 %
Neutro Abs: 3.8 10*3/uL (ref 1.7–7.7)
Neutrophils Relative %: 66 %
Platelets: 220 10*3/uL (ref 150–400)
RBC: 4.71 MIL/uL (ref 4.22–5.81)
RDW: 11.9 % (ref 11.5–15.5)
WBC: 5.7 10*3/uL (ref 4.0–10.5)
nRBC: 0 % (ref 0.0–0.2)

## 2018-10-08 LAB — SARS CORONAVIRUS 2 AG (30 MIN TAT): SARS Coronavirus 2 Ag: NEGATIVE

## 2018-10-08 LAB — LACTIC ACID, PLASMA: Lactic Acid, Venous: 1.8 mmol/L (ref 0.5–1.9)

## 2018-10-08 MED ORDER — ACETAMINOPHEN 325 MG PO TABS
650.0000 mg | ORAL_TABLET | Freq: Once | ORAL | Status: AC | PRN
  Administered 2018-10-08: 650 mg via ORAL
  Filled 2018-10-08: qty 2

## 2018-10-08 MED ORDER — HYDROCODONE-ACETAMINOPHEN 5-325 MG PO TABS
1.0000 | ORAL_TABLET | Freq: Once | ORAL | Status: AC
  Administered 2018-10-08: 1 via ORAL
  Filled 2018-10-08: qty 1

## 2018-10-08 MED ORDER — DIPHENHYDRAMINE HCL 25 MG PO CAPS
25.0000 mg | ORAL_CAPSULE | Freq: Once | ORAL | Status: AC
  Administered 2018-10-08: 25 mg via ORAL
  Filled 2018-10-08: qty 1

## 2018-10-08 NOTE — ED Provider Notes (Signed)
[facility] Provider Note   CSN: [id number] Arrival date & time: 10/08/18  [place]    History   Chief Complaint Chief Complaint  Patient presents with  . Cough    HPI Christopher Roberson is a 43 y.o. male.     HPI 2 days ago patient started getting severe body aches and cough with fever.  Reports he has a generalized headache aching.  He reports he has lost his taste and smell.  He reports he feels short of breath.  Patient's wife was hospitalized to the ICU 5 days ago with COVID-19.  [facility] is at [facility].  Patient reports he has diabetes and hypertension.  He reports he is compliant with his medications.  He is a non-smoker. Past Medical History:  Diagnosis Date  . Diabetes mellitus   . Hypertension   . Stroke [facility])     Patient Active Problem List   Diagnosis Date Noted  . Acute respiratory failure with hypoxia ([place]) 10/08/2018    Past Surgical History:  Procedure Laterality Date  . APPENDECTOMY    . CARPAL TUNNEL RELEASE    . HERNIA REPAIR          Home Medications    Prior to Admission medications   Medication Sig Start Date End Date Taking? Authorizing Provider  amLODipine (NORVASC) 5 MG tablet Take 5 mg by mouth daily.   Yes [provider]  citalopram (CELEXA) 10 MG tablet Take 10 mg by mouth daily.   Yes [provider]  Ergocalciferol (VITAMIN D2 PO) Take by mouth.   Yes [provider]  glipiZIDE (GLUCOTROL) 10 MG tablet Take 10 mg by mouth daily before breakfast.   Yes [provider]  insulin glargine (LANTUS) 100 UNIT/ML injection Inject 40 Units into the skin at bedtime.   Yes [provider]  lisinopril-hydrochlorothiazide (PRINZIDE,ZESTORETIC) 20-25 MG per tablet Take 1 tablet by mouth daily.   Yes [provider]  metFORMIN (GLUCOPHAGE) 500 MG tablet Take 500 mg by mouth 2 (two) times daily with a meal.   Yes [provider]  potassium chloride  SA (K-DUR,KLOR-CON) 20 MEQ tablet Take 20 mEq by mouth 2 (two) times daily.   Yes [provider]  promethazine (PHENERGAN) 25 MG suppository Place 1 suppository (25 mg total) rectally every 6 (six) hours as needed for nausea or vomiting. 05/23/15  Yes Mabe, Forbes Cellar, MD  sertraline (ZOLOFT) 50 MG tablet Take 50 mg by mouth daily.   Yes [provider]  traZODone (DESYREL) 50 MG tablet Take 50 mg by mouth at bedtime.   Yes [provider]  carvedilol (COREG) 6.25 MG tablet Take 6.25 mg by mouth 2 (two) times daily with a meal.    [provider]  cephALEXin (KEFLEX) 500 MG capsule Take 2 capsules (1,000 mg total) by mouth 2 (two) times daily. 09/03/15   Charlesetta Shanks, MD  ondansetron (ZOFRAN) 4 MG tablet Take 1 tablet (4 mg total) by mouth every 6 (six) hours. 05/23/15   Mabe, Forbes Cellar, MD    Family History No family history on file.  Social History Social History   Tobacco Use  . Smoking status: Never Smoker  . Smokeless tobacco: Never Used  Substance Use Topics  . Alcohol use: No  . Drug use: No     Allergies   Percocet [oxycodone-acetaminophen]   Review of Systems Review of Systems 10 Systems reviewed and are negative for acute change except as  noted in the HPI.   Physical Exam Updated Vital Signs BP 103/89   Pulse 90   Temp 98.9 F (37.2 C) (Oral)   Resp 20   Ht 5\' 8"  (1.727 m)   Wt 134.8 kg   SpO2 97%   BMI 45.19 kg/m   Physical Exam Constitutional:      Comments: Patient is alert and appropriate.  Mild tachypnea.  Morbid obesity.  HENT:     Head: Normocephalic and atraumatic.     Mouth/Throat:     Pharynx: Oropharynx is clear.  Eyes:     Extraocular Movements: Extraocular movements intact.     Pupils: Pupils are equal, round, and reactive to light.  Neck:     Musculoskeletal: Neck supple.  Cardiovascular:     Comments: Borderline tachycardia no gross rub murmur gallop. Pulmonary:     Comments: Mild tachypnea.  Lungs  grossly clear. Abdominal:     General: There is no distension.     Palpations: Abdomen is soft.     Tenderness: There is no abdominal tenderness. There is no guarding.  Musculoskeletal: Normal range of motion.        General: No swelling or tenderness.     Right lower leg: No edema.     Left lower leg: No edema.  Skin:    General: Skin is warm and dry.  Neurological:     General: No focal deficit present.     Mental Status: He is oriented to person, place, and time.     Cranial Nerves: No cranial nerve deficit.     Coordination: Coordination normal.  Psychiatric:        Mood and Affect: Mood normal.      ED Treatments / Results  Labs (all labs ordered are listed, but only abnormal results are displayed) Labs Reviewed  COMPREHENSIVE METABOLIC PANEL - Abnormal; Notable for the following components:      Result Value   Glucose, Bld 212 (*)    All other components within normal limits  SARS CORONAVIRUS 2 (HOSP ORDER, PERFORMED IN [place] [facility] VIA ABBOTT ID)  LACTIC ACID, PLASMA  CBC WITH DIFFERENTIAL/PLATELET  LACTIC ACID, PLASMA  URINALYSIS, ROUTINE W REFLEX MICROSCOPIC    EKG None  Radiology Dg Chest Port 1 View  Result Date: 10/08/2018 CLINICAL DATA:  Short of breath. Cough and fever. Exposure to COVID-19 EXAM: PORTABLE CHEST 1 VIEW COMPARISON:  03/20/2018 FINDINGS: Mild bilateral airspace disease left greater than right could represent pneumonia or edema. Cardiac enlargement. No pleural effusion. IMPRESSION: Mild bibasilar airspace disease, possible pneumonia given COVID-19 exposure. Electronically Signed   By: Franchot Gallo M.D.   On: 10/08/2018 20:02    Procedures Procedures (including critical care time)  Medications Ordered in ED Medications  acetaminophen (TYLENOL) tablet 650 mg (650 mg Oral Given 10/08/18 1921)  HYDROcodone-acetaminophen (NORCO/VICODIN) 5-325 MG per tablet 1 tablet (1 tablet Oral Given 10/08/18 2146)  diphenhydrAMINE (BENADRYL) capsule 25  mg (25 mg Oral Given 10/08/18 2146)     Initial Impression / Assessment and Plan / ED Course  I have reviewed the triage vital signs and the nursing notes.  Pertinent labs & imaging results that were available during my care of the patient were reviewed by me and considered in my medical decision making (see chart for details).    Consult: Tried hospitalist Dr. Marlowe Sax for admission.   Patient presents with fever, body ache, cough, shortness of breath, loss of taste and smell.  His wife is hospitalized  in [facility] with [facility].  He does have hypoxia down to 88%.  Patient is on 2 L and sats at 95%.  Rapid testing is negative however with symptom constellation and known positive close contact, will plan for admission.  Final Clinical Impressions(s) / ED Diagnoses   Final diagnoses:  Viral pneumonia  Hypoxia  Close Exposure to Covid-19 Virus    ED Discharge Orders    None       Charlesetta Shanks, MD 10/08/18 2247

## 2018-10-08 NOTE — ED Triage Notes (Addendum)
Cough with yellow sputum, fever, bodyaches x 2 days. Worse headache he has ever had. No taste, no smell.  His wife was diagnosed with Covid 19 5 days ago.

## 2018-10-08 NOTE — ED Notes (Signed)
[facility] for highly probable COVID pt  Spoke with Baxter Flattery.

## 2018-10-09 ENCOUNTER — Encounter (HOSPITAL_COMMUNITY): Payer: Self-pay | Admitting: Internal Medicine

## 2018-10-09 DIAGNOSIS — R0602 Shortness of breath: Secondary | ICD-10-CM | POA: Diagnosis not present

## 2018-10-09 DIAGNOSIS — J9601 Acute respiratory failure with hypoxia: Secondary | ICD-10-CM | POA: Diagnosis not present

## 2018-10-09 DIAGNOSIS — Z794 Long term (current) use of insulin: Secondary | ICD-10-CM | POA: Diagnosis not present

## 2018-10-09 DIAGNOSIS — Z6841 Body Mass Index (BMI) 40.0 and over, adult: Secondary | ICD-10-CM | POA: Diagnosis not present

## 2018-10-09 DIAGNOSIS — E119 Type 2 diabetes mellitus without complications: Secondary | ICD-10-CM

## 2018-10-09 DIAGNOSIS — Z20822 Contact with and (suspected) exposure to covid-19: Secondary | ICD-10-CM | POA: Diagnosis present

## 2018-10-09 DIAGNOSIS — I10 Essential (primary) hypertension: Secondary | ICD-10-CM

## 2018-10-09 DIAGNOSIS — A084 Viral intestinal infection, unspecified: Secondary | ICD-10-CM | POA: Diagnosis not present

## 2018-10-09 DIAGNOSIS — Z79899 Other long term (current) drug therapy: Secondary | ICD-10-CM | POA: Diagnosis not present

## 2018-10-09 DIAGNOSIS — U071 COVID-19: Secondary | ICD-10-CM | POA: Diagnosis not present

## 2018-10-09 DIAGNOSIS — N400 Enlarged prostate without lower urinary tract symptoms: Secondary | ICD-10-CM | POA: Diagnosis not present

## 2018-10-09 DIAGNOSIS — E785 Hyperlipidemia, unspecified: Secondary | ICD-10-CM | POA: Diagnosis not present

## 2018-10-09 DIAGNOSIS — Z8673 Personal history of transient ischemic attack (TIA), and cerebral infarction without residual deficits: Secondary | ICD-10-CM | POA: Diagnosis not present

## 2018-10-09 DIAGNOSIS — J1289 Other viral pneumonia: Secondary | ICD-10-CM | POA: Diagnosis not present

## 2018-10-09 LAB — COMPREHENSIVE METABOLIC PANEL
ALT: 37 U/L (ref 0–44)
AST: 30 U/L (ref 15–41)
Albumin: 3.5 g/dL (ref 3.5–5.0)
Alkaline Phosphatase: 43 U/L (ref 38–126)
Anion gap: 10 (ref 5–15)
BUN: 10 mg/dL (ref 6–20)
CO2: 26 mmol/L (ref 22–32)
Calcium: 8.7 mg/dL — ABNORMAL LOW (ref 8.9–10.3)
Chloride: 100 mmol/L (ref 98–111)
Creatinine, Ser: 0.83 mg/dL (ref 0.61–1.24)
GFR calc Af Amer: >60 mL/min (ref >60)
GFR calc non Af Amer: >60 mL/min (ref >60)
Glucose, Bld: 199 mg/dL — ABNORMAL HIGH (ref 70–99)
Potassium: 3.5 mmol/L (ref 3.5–5.1)
Sodium: 136 mmol/L (ref 135–145)
Total Bilirubin: 1 mg/dL (ref 0.3–1.2)
Total Protein: 7.1 g/dL (ref 6.5–8.1)

## 2018-10-09 LAB — FERRITIN
Ferritin: 436 ng/mL — ABNORMAL HIGH (ref 24–336)
Ferritin: 482 ng/mL — ABNORMAL HIGH (ref 24–336)

## 2018-10-09 LAB — C-REACTIVE PROTEIN
CRP: 3.4 mg/dL — ABNORMAL HIGH (ref <1.0)
CRP: 3.4 mg/dL — ABNORMAL HIGH (ref <1.0)

## 2018-10-09 LAB — CBC
HCT: 41 % (ref 39.0–52.0)
Hemoglobin: 13.7 g/dL (ref 13.0–17.0)
MCH: 29.7 pg (ref 26.0–34.0)
MCHC: 33.4 g/dL (ref 30.0–36.0)
MCV: 88.7 fL (ref 80.0–100.0)
Platelets: 221 10*3/uL (ref 150–400)
RBC: 4.62 MIL/uL (ref 4.22–5.81)
RDW: 11.9 % (ref 11.5–15.5)
WBC: 4.7 10*3/uL (ref 4.0–10.5)
nRBC: 0 % (ref 0.0–0.2)

## 2018-10-09 LAB — PROCALCITONIN: Procalcitonin: <0.1 ng/mL

## 2018-10-09 LAB — HIV ANTIBODY (ROUTINE TESTING W REFLEX): HIV Screen 4th Generation wRfx: NONREACTIVE

## 2018-10-09 LAB — HEMOGLOBIN A1C
Hgb A1c MFr Bld: 8.1 % — ABNORMAL HIGH (ref 4.8–5.6)
Mean Plasma Glucose: 185.77 mg/dL

## 2018-10-09 LAB — D-DIMER, QUANTITATIVE
D-Dimer, Quant: 0.4 ug/mL-FEU (ref 0.00–0.50)
D-Dimer, Quant: 0.42 ug/mL-FEU (ref 0.00–0.50)

## 2018-10-09 LAB — LACTATE DEHYDROGENASE
LDH: 255 U/L — ABNORMAL HIGH (ref 98–192)
LDH: 245 U/L — ABNORMAL HIGH (ref 98–192)

## 2018-10-09 LAB — FIBRINOGEN: Fibrinogen: 474 mg/dL (ref 210–475)

## 2018-10-09 LAB — GLUCOSE, CAPILLARY
Glucose-Capillary: 203 mg/dL — ABNORMAL HIGH (ref 70–99)
Glucose-Capillary: 174 mg/dL — ABNORMAL HIGH (ref 70–99)
Glucose-Capillary: 332 mg/dL — ABNORMAL HIGH (ref 70–99)

## 2018-10-09 LAB — TROPONIN I: Troponin I: <0.03 ng/mL (ref <0.03)

## 2018-10-09 LAB — SARS CORONAVIRUS 2: SARS Coronavirus 2: DETECTED — AB

## 2018-10-09 MED ORDER — ARIPIPRAZOLE 5 MG PO TABS
5.0000 mg | ORAL_TABLET | Freq: Every morning | ORAL | Status: DC
  Administered 2018-10-10: 5 mg via ORAL
  Filled 2018-10-09 (×2): qty 1

## 2018-10-09 MED ORDER — INSULIN ASPART 100 UNIT/ML ~~LOC~~ SOLN
0.0000 [IU] | Freq: Every day | SUBCUTANEOUS | Status: DC
  Administered 2018-10-09: 4 [IU] via SUBCUTANEOUS

## 2018-10-09 MED ORDER — ENOXAPARIN SODIUM 40 MG/0.4ML ~~LOC~~ SOLN
40.0000 mg | SUBCUTANEOUS | Status: DC
  Administered 2018-10-09 – 2018-10-10 (×2): 40 mg via SUBCUTANEOUS
  Filled 2018-10-09 (×2): qty 0.4

## 2018-10-09 MED ORDER — INSULIN GLARGINE 100 UNIT/ML ~~LOC~~ SOLN: 20.0000 [IU] | SUBCUTANEOUS | Status: DC

## 2018-10-09 MED ORDER — BUSPIRONE HCL 10 MG PO TABS
10.0000 mg | ORAL_TABLET | Freq: Three times a day (TID) | ORAL | Status: DC
  Administered 2018-10-09 – 2018-10-10 (×3): 10 mg via ORAL
  Filled 2018-10-09 (×8): qty 1

## 2018-10-09 MED ORDER — ALBUTEROL SULFATE HFA 108 (90 BASE) MCG/ACT IN AERS
2.0000 | INHALATION_SPRAY | RESPIRATORY_TRACT | Status: DC | PRN
  Filled 2018-10-09: qty 6.7

## 2018-10-09 MED ORDER — HYDROCODONE-ACETAMINOPHEN 5-325 MG PO TABS
1.0000 | ORAL_TABLET | Freq: Once | ORAL | Status: AC
  Administered 2018-10-09: 1 via ORAL
  Filled 2018-10-09: qty 1

## 2018-10-09 MED ORDER — DULOXETINE HCL 30 MG PO CPEP
30.0000 mg | ORAL_CAPSULE | Freq: Every morning | ORAL | Status: DC
  Administered 2018-10-10: 30 mg via ORAL
  Filled 2018-10-09 (×2): qty 1

## 2018-10-09 MED ORDER — INSULIN GLARGINE 100 UNIT/ML ~~LOC~~ SOLN
20.0000 [IU] | Freq: Every day | SUBCUTANEOUS | Status: DC
  Administered 2018-10-10: 20 [IU] via SUBCUTANEOUS
  Filled 2018-10-09: qty 0.2

## 2018-10-09 MED ORDER — SERTRALINE HCL 50 MG PO TABS
200.0000 mg | ORAL_TABLET | Freq: Every day | ORAL | Status: DC
  Administered 2018-10-10: 200 mg via ORAL
  Filled 2018-10-09: qty 4

## 2018-10-09 MED ORDER — METHYLPREDNISOLONE SODIUM SUCC 125 MG IJ SOLR
60.0000 mg | Freq: Two times a day (BID) | INTRAMUSCULAR | Status: DC
  Administered 2018-10-09 – 2018-10-10 (×2): 60 mg via INTRAVENOUS
  Filled 2018-10-09 (×2): qty 2

## 2018-10-09 MED ORDER — ATORVASTATIN CALCIUM 40 MG PO TABS
80.0000 mg | ORAL_TABLET | Freq: Every morning | ORAL | Status: DC
  Administered 2018-10-10: 80 mg via ORAL
  Filled 2018-10-09: qty 2

## 2018-10-09 MED ORDER — TRAZODONE HCL 50 MG PO TABS: 100.0000 mg | ORAL_TABLET | Freq: Every evening | ORAL | Status: DC | PRN

## 2018-10-09 MED ORDER — LOPERAMIDE HCL 2 MG PO CAPS: 2.0000 mg | ORAL_CAPSULE | ORAL | Status: DC | PRN

## 2018-10-09 MED ORDER — ASPIRIN EC 81 MG PO TBEC
81.0000 mg | DELAYED_RELEASE_TABLET | Freq: Every morning | ORAL | Status: DC
  Administered 2018-10-10: 81 mg via ORAL
  Filled 2018-10-09: qty 1

## 2018-10-09 MED ORDER — INSULIN ASPART 100 UNIT/ML ~~LOC~~ SOLN
0.0000 [IU] | Freq: Three times a day (TID) | SUBCUTANEOUS | Status: DC
  Administered 2018-10-09: 3 [IU] via SUBCUTANEOUS
  Administered 2018-10-09: 5 [IU] via SUBCUTANEOUS
  Administered 2018-10-10: 13:00:00 15 [IU] via SUBCUTANEOUS
  Administered 2018-10-10: 5 [IU] via SUBCUTANEOUS

## 2018-10-09 MED ORDER — ONDANSETRON HCL 4 MG/2ML IJ SOLN: 4.0000 mg | Freq: Four times a day (QID) | INTRAMUSCULAR | Status: DC | PRN

## 2018-10-09 MED ORDER — INSULIN GLARGINE 100 UNIT/ML ~~LOC~~ SOLN
40.0000 [IU] | Freq: Every day | SUBCUTANEOUS | Status: DC
  Administered 2018-10-09: 40 [IU] via SUBCUTANEOUS
  Filled 2018-10-09 (×2): qty 0.4

## 2018-10-09 MED ORDER — ACETAMINOPHEN 325 MG PO TABS
650.0000 mg | ORAL_TABLET | Freq: Four times a day (QID) | ORAL | Status: DC | PRN
  Administered 2018-10-09 (×2): 650 mg via ORAL
  Filled 2018-10-09 (×2): qty 2

## 2018-10-09 MED ORDER — CYCLOBENZAPRINE HCL 5 MG PO TABS
5.0000 mg | ORAL_TABLET | Freq: Three times a day (TID) | ORAL | Status: DC | PRN
  Filled 2018-10-09: qty 1

## 2018-10-09 MED ORDER — SODIUM CHLORIDE 0.9 % IV SOLN
INTRAVENOUS | Status: AC
  Administered 2018-10-09 (×2): via INTRAVENOUS

## 2018-10-09 MED ORDER — PREGABALIN 25 MG PO CAPS
150.0000 mg | ORAL_CAPSULE | Freq: Two times a day (BID) | ORAL | Status: DC
  Administered 2018-10-09 – 2018-10-10 (×2): 150 mg via ORAL
  Filled 2018-10-09 (×2): qty 6

## 2018-10-09 NOTE — H&P (Signed)
History and Physical    P[place]on Pam JEH:[id number] DOB: 1975-11-26 DOA: 10/08/2018  PCP: [facility] Patient coming from: [facility] ED  Chief Complaint: Fever, cough, shortness of breath  HPI: Christopher Roberson is a 43 y.o. male with medical history significant of type 2 diabetes, hypertension, CVA presented to [place] ED with complaints of fever, cough, and shortness of breath.  Patient's wife was admitted to [facility] regional hospital ICU 5 days ago with COVID-19.  Patient reports 2-day history of fevers, cough productive of yellow/green-colored sputum, shortness of breath, body aches, headaches, nonbloody nonbilious emesis, and nonbloody diarrhea.  States he has lost his ability to smell and taste.  Reports having slight abdominal discomfort only when vomiting but not otherwise.  ED Course: Temperature 100.9 F.  SPO2 88% on room air, placed on 2 L supplemental oxygen.  COVID-19 rapid test negative.  No leukocytosis.  Lactic acid normal.  LFTs normal.  Blood glucose 212.  Chest x-ray showing mild bibasilar airspace disease, possible pneumonia given COVID-19 exposure.  Review of Systems:  All systems reviewed and apart from history of presenting illness, are negative.  Past Medical History:  Diagnosis Date   Diabetes mellitus    Hypertension    Stroke [facility])     Past Surgical History:  Procedure Laterality Date   APPENDECTOMY     CARPAL TUNNEL RELEASE     HERNIA REPAIR       reports that he has never smoked. He has never used smokeless tobacco. He reports that he does not drink alcohol or use drugs.  Allergies  Allergen Reactions   Percocet [Oxycodone-Acetaminophen] Itching    Family History  Problem Relation Age of Onset   Heart disease Mother     Prior to Admission medications   Medication Sig Start Date End Date Taking? Authorizing Provider  amLODipine (NORVASC) 5 MG tablet Take 5 mg by mouth daily.   Yes [provider]  citalopram (CELEXA) 10 MG tablet Take 10 mg by mouth daily.   Yes [provider]  Ergocalciferol (VITAMIN D2 PO) Take by mouth.   Yes [provider]  glipiZIDE (GLUCOTROL) 10 MG tablet Take 10 mg by mouth daily before breakfast.   Yes [provider]  insulin glargine (LANTUS) 100 UNIT/ML injection Inject 40 Units into the skin at bedtime.   Yes [provider]  lisinopril-hydrochlorothiazide (PRINZIDE,ZESTORETIC) 20-25 MG per tablet Take 1 tablet by mouth daily.   Yes [provider]  metFORMIN (GLUCOPHAGE) 500 MG tablet Take 500 mg by mouth 2 (two) times daily with a meal.   Yes [provider]  potassium chloride SA (K-DUR,KLOR-CON) 20 MEQ tablet Take 20 mEq by mouth 2 (two) times daily.   Yes [provider]  promethazine (PHENERGAN) 25 MG suppository Place 1 suppository (25 mg total) rectally every 6 (six) hours as needed for nausea or vomiting. 05/23/15  Yes Mabe, Forbes Cellar, MD  sertraline (ZOLOFT) 50 MG tablet Take 50 mg by mouth daily.   Yes [provider]  traZODone (DESYREL) 50 MG tablet Take 50 mg by mouth at bedtime.   Yes [provider]  carvedilol (COREG) 6.25 MG tablet Take 6.25 mg by mouth 2 (two) times daily with a meal.    [provider]  cephALEXin (KEFLEX) 500 MG capsule Take 2 capsules (1,000 mg total) by mouth 2 (two) times daily. 09/03/15   Charlesetta Shanks, MD  ondansetron (ZOFRAN) 4 MG tablet  Take 1 tablet (4 mg total) by mouth every 6 (six) hours. 05/23/15   Pixie Casino, MD    Physical Exam: Vitals:   10/08/18 2200 10/09/18 0124 10/09/18 0146 10/09/18 0544  BP: 103/89 123/81  127/86  Pulse: 90 97  74  Resp: 20     Temp:   98.2 F (36.8 C) 98.3 F (36.8 C)  TempSrc:   Oral Oral  SpO2: 97% 98%  97%  Weight:    135.3 kg  Height:    5' 7.99" (1.727 m)    Physical Exam  Constitutional: He is oriented to person, place, and time. He appears well-developed and  well-nourished. No distress.  HENT:  Head: Normocephalic.  Dry mucous membranes  Eyes: Right eye exhibits no discharge. Left eye exhibits no discharge.  Neck: Neck supple.  Cardiovascular: Normal rate, regular rhythm and intact distal pulses.  Pulmonary/Chest: Effort normal. He has no wheezes.  Very mild crackles appreciated at the bases On 2 L supplemental oxygen  Abdominal: Soft. Bowel sounds are normal. He exhibits no distension. There is no abdominal tenderness. There is no rebound and no guarding.  Musculoskeletal:        General: No edema.  Neurological: He is alert and oriented to person, place, and time.  Skin: Skin is warm and dry. He is not diaphoretic.     Labs on Admission: I have personally reviewed following labs and imaging studies  CBC: Recent Labs  Lab 10/08/18 1915  WBC 5.7  NEUTROABS 3.8  HGB 13.6  HCT 41.8  MCV 88.7  PLT 353   Basic Metabolic Panel: Recent Labs  Lab 10/08/18 1915  NA 135  K 3.6  CL 98  CO2 26  GLUCOSE 212*  BUN 10  CREATININE 1.06  CALCIUM 9.1   GFR: Estimated Creatinine Clearance: 119.7 mL/min (by C-G formula based on SCr of 1.06 mg/dL). Liver Function Tests: Recent Labs  Lab 10/08/18 1915  AST 30  ALT 38  ALKPHOS 49  BILITOT 0.8  PROT 7.8  ALBUMIN 3.9   No results for input(s): LIPASE, AMYLASE in the last 168 hours. No results for input(s): AMMONIA in the last 168 hours. Coagulation Profile: No results for input(s): INR, PROTIME in the last 168 hours. Cardiac Enzymes: No results for input(s): CKTOTAL, CKMB, CKMBINDEX, TROPONINI in the last 168 hours. BNP (last 3 results) No results for input(s): PROBNP in the last 8760 hours. HbA1C: No results for input(s): HGBA1C in the last 72 hours. CBG: No results for input(s): GLUCAP in the last 168 hours. Lipid Profile: No results for input(s): CHOL, HDL, LDLCALC, TRIG, CHOLHDL, LDLDIRECT in the last 72 hours. Thyroid Function Tests: No results for input(s): TSH,  T4TOTAL, FREET4, T3FREE, THYROIDAB in the last 72 hours. Anemia Panel: No results for input(s): VITAMINB12, FOLATE, FERRITIN, TIBC, IRON, RETICCTPCT in the last 72 hours. Urine analysis:    Component Value Date/Time   COLORURINE YELLOW 04/25/2017 1000   APPEARANCEUR CLEAR 04/25/2017 1000   LABSPEC 1.020 04/25/2017 1000   PHURINE 7.0 04/25/2017 1000   GLUCOSEU >=500 (A) 04/25/2017 1000   HGBUR NEGATIVE 04/25/2017 1000   BILIRUBINUR NEGATIVE 04/25/2017 1000   KETONESUR NEGATIVE 04/25/2017 1000   PROTEINUR NEGATIVE 04/25/2017 1000   UROBILINOGEN 1.0 01/05/2012 1232   NITRITE NEGATIVE 04/25/2017 1000   LEUKOCYTESUR NEGATIVE 04/25/2017 1000    Radiological Exams on Admission: Dg Chest Port 1 View  Result Date: 10/08/2018 CLINICAL DATA:  Short of breath. Cough and fever. Exposure to COVID-19 EXAM:  PORTABLE CHEST 1 VIEW COMPARISON:  03/20/2018 FINDINGS: Mild bilateral airspace disease left greater than right could represent pneumonia or edema. Cardiac enlargement. No pleural effusion. IMPRESSION: Mild bibasilar airspace disease, possible pneumonia given COVID-19 exposure. Electronically Signed   By: Franchot Gallo M.D.   On: 10/08/2018 20:02   Assessment/Plan Principal Problem:   Acute respiratory failure with hypoxia [facility]) Active Problems:   Suspected Covid-19 Virus Infection   Viral gastroenteritis   Type 2 diabetes mellitus (HCC)   Essential hypertension   Acute hypoxic respiratory failure, suspect COVID-19 infection Presenting with a 2-day history of cough, shortness of breath, and other symptoms consistent with a viral illness.  Temperature 100.9 F.  SPO2 88% on room air, placed on 2 L supplemental oxygen.  COVID-19 rapid test done at [facility] ED negative.  No leukocytosis.  Lactic acid normal.  LFTs normal. Chest x-ray showing mild bibasilar airspace disease, possible pneumonia given COVID-19 exposure. -Repeat SARS-CoV-2 test -Continue supplemental oxygen -Tylenol  PRN -Avoid NSAIDs -Check ferritin, fibrinogen, d-dimer, LDH, CRP, procalcitonin, troponin -Airborne and contact precautions -Continuous pulse ox  Acute viral gastroenteritis, suspect COVID-19 infection Presenting with a 2-day history of nonbloody nonbilious emesis, nonbloody diarrhea, and other symptoms consistent with a viral illness.  Abdominal exam benign.  COVID-19 rapid test done at [facility] ED negative. -Repeat SARS-CoV-2 test -Symptomatic management: Gentle IV fluid hydration, IV Zofran PRN nausea, Loperamide PRN  Type 2 diabetes  Blood glucose 212.   -Check A1c.  Sliding scale insulin and CBG checks.  Hypertension Currently normotensive. -Hydralazine PRN  Unable to safely order home medications at this time as pharmacy medication reconciliation is pending.  DVT prophylaxis: Lovenox Code Status: Full code Family Communication: No family available. Disposition Plan: Anticipate discharge after clinical improvement. Consults called: None Admission status: It is my clinical opinion that referral for OBSERVATION is reasonable and necessary in this patient based on the above information provided. The aforementioned taken together are felt to place the patient at high risk for further clinical deterioration. However it is anticipated that the patient may be medically stable for discharge from the hospital within 24 to 48 hours.  The medical decision making on this patient was of high complexity and the patient is at high risk for clinical deterioration, therefore this is a level 3 visit.  Christopher Leff MD [facility] Pager [phone]  If 7PM-7AM, please contact night-coverage [email or web address] Password [email or web address]  10/09/2018, 6:20 AM

## 2018-10-09 NOTE — Plan of Care (Signed)

## 2018-10-09 NOTE — Progress Notes (Signed)
Patient arrived to room 142, following transfer from [facility].  Patient is alert and oriented x4.  Oriented to room, use of bed and call light, and hospital airborne/contact isolation policy.  Patient presently on 2L o2 via [place].  Patient denies home oxygen, will attempt to wean o2 per MD request.  Reports dyspnea and cough began two days ago, but denies SOB at rest.   Patient further reports his wife was admitted to [facility] Sunday and was transferred to ICU Monday night.  He said he was thankful to hear she is doing better today, but it still concerned.  Emotional support provided.    10/09/18 1443  Vitals  Temp 98.1 F (36.7 C)  Temp Source Oral  BP (!) 142/95  MAP (mmHg) 109  BP Location Left Arm  BP Method Automatic  Patient Position (if appropriate) Sitting  ECG Heart Rate 84  Resp 20  Oxygen Therapy  SpO2 98 %  O2 Device Nasal Cannula  O2 Flow Rate (L/min) 2 L/min  Patient Activity (if Appropriate) In bed  Pulse Oximetry Type Continuous  Pain Assessment  Pain Scale 0-10  Pain Score 0  MEWS Score  MEWS RR 0  MEWS Pulse 0  MEWS Systolic 0  MEWS LOC 0  MEWS Temp 0  MEWS Score 0  MEWS Score Color Nyoka Cowden

## 2018-10-09 NOTE — ED Notes (Signed)
Pt transferred to [place] via [facility] 

## 2018-10-09 NOTE — Progress Notes (Signed)
Patient new admission, from [facility] via [facility].  Report provided from [facility] around [place].  Patient tested for covid at [facility] with negative results.  Patient alert and oriented x4, informed that his wife is currently in ICU following Covid diagnosis.  Patient re-tested for Covid, and results currently pending.  Stable condition upon arrival.  Will continue to monitor.

## 2018-10-09 NOTE — Progress Notes (Signed)
Patient seen earlier this morning by Dr. Nevada Crane, per RN-O2 saturation on room air around 95%.  Hassubtle PNA on chest x-ray-we will start steroids-due to O2 saturation being around 95-96%-does not yet qualify for Remdesivir.  If hypoxia worsens-we can reconsider and start Remdesivir.  Nursing staff asked to let us know if hypoxia worsens.

## 2018-10-09 NOTE — Progress Notes (Signed)
Christopher Roberson is a 43 y.o. male with medical history significant of type 2 diabetes, hypertension, CVA presented to [place] ED with complaints of fever, cough, and shortness of breath.  Patient's wife was admitted to [facility] regional hospital ICU 5 days ago with COVID-19.  Patient reports 2-day history of fevers, cough productive of yellow/green-colored sputum, shortness of breath, body aches, headaches, nonbloody nonbilious emesis, and nonbloody diarrhea.  States he has lost his ability to smell and taste.  Reports having slight abdominal discomfort only when vomiting but not otherwise.  Initial in house covid-19 negative on 10/08/18. Repeat Covid-19 positive on 10/09/18.  10/09/18: Patient seen and examined at bedside.  He reports persistent pleuritic pain worse when he coughs.  Also diarrhea with last watery stool last night.  Also reports abdominal cramping intermittently.    Repeat COVID-19 test positive.  Independently reviewed chest x-ray done on admission which shows bilateral patchy pulmonary infiltrates. O2 saturation on presentation 88% on room air corrected to 97% on 2 L of oxygen by nasal cannula.  Patient may benefit from IV Solu-Medrol and Remdesivir.  Patient will be transferred to [facility] to continue his care.  Please refer to H&P dictated by Dr. Marlowe Sax on 10/09/18 for further details of the assessment and plan.

## 2018-10-09 NOTE — Progress Notes (Signed)
Patient asked if he ordered lunch around 1230. Pt informed this RN that he did order lunch and that it would arrive around 1:20pm per dietary. Tray not delivered with other patients trays due to a late order. Tech instructed not to get blood sugar until the patients tray arrived. Announcement of tray arrival received after patient left with [facility] to go to [facility]. Attempted to inform the receiving RN that the patient was leaving with [facility] and that the 12 oclock blood sugar and insulin were not taken or administered due to the late tray arrival. The receiving RN unavailable and message left with the charge RN.

## 2018-10-09 NOTE — Progress Notes (Signed)
Inpatient Diabetes Program Recommendations  AACE/ADA: New Consensus Statement on Inpatient Glycemic Control (2015)  Target Ranges:  Prepandial:   less than 140 mg/dL      Peak postprandial:   less than 180 mg/dL (1-2 hours)      Critically ill patients:  140 - 180 mg/dL   Lab Results  Component Value Date   GLUCAP 203 (H) 10/09/2018   HGBA1C 8.1 (H) 10/09/2018    Review of Glycemic Control Results for Christopher Roberson, Christopher Roberson (MRN [id number]) as of 10/09/2018 11:53  Ref. Range 10/09/2018 09:41  Glucose-Capillary Latest Ref Range: 70 - 99 mg/dL 203 (H)   Diabetes history: DM 2 Outpatient Diabetes medications:  Novolog 14 units tid with meals, Lantus 20 units in the AM and 40 units in the evening Current orders for Inpatient glycemic control:  Novolog moderate tid with meals and HS Inpatient Diabetes Program Recommendations:    Please restart 1/2 of patient's home dose of basal insulin.  Consider adding Lantus 30 units daily.  Also please add Novolog 4 units tid with meals (hold if patient eats less than 50%).   Thanks,  Adah Perl, RN, BC-ADM Inpatient Diabetes Coordinator Pager [phone] (8a-5p)

## 2018-10-10 DIAGNOSIS — E119 Type 2 diabetes mellitus without complications: Secondary | ICD-10-CM

## 2018-10-10 DIAGNOSIS — Z794 Long term (current) use of insulin: Secondary | ICD-10-CM

## 2018-10-10 DIAGNOSIS — I10 Essential (primary) hypertension: Secondary | ICD-10-CM

## 2018-10-10 DIAGNOSIS — U071 COVID-19: Secondary | ICD-10-CM

## 2018-10-10 LAB — COMPREHENSIVE METABOLIC PANEL
ALT: 34 U/L (ref 0–44)
AST: 23 U/L (ref 15–41)
Albumin: 3.8 g/dL (ref 3.5–5.0)
Alkaline Phosphatase: 44 U/L (ref 38–126)
Anion gap: 10 (ref 5–15)
BUN: 12 mg/dL (ref 6–20)
CO2: 26 mmol/L (ref 22–32)
Calcium: 9 mg/dL (ref 8.9–10.3)
Chloride: 101 mmol/L (ref 98–111)
Creatinine, Ser: 0.76 mg/dL (ref 0.61–1.24)
GFR calc Af Amer: >60 mL/min (ref >60)
GFR calc non Af Amer: >60 mL/min (ref >60)
Glucose, Bld: 305 mg/dL — ABNORMAL HIGH (ref 70–99)
Potassium: 4.1 mmol/L (ref 3.5–5.1)
Sodium: 137 mmol/L (ref 135–145)
Total Bilirubin: 0.7 mg/dL (ref 0.3–1.2)
Total Protein: 7.4 g/dL (ref 6.5–8.1)

## 2018-10-10 LAB — CBC
HCT: 38.8 % — ABNORMAL LOW (ref 39.0–52.0)
Hemoglobin: 13.2 g/dL (ref 13.0–17.0)
MCH: 29.8 pg (ref 26.0–34.0)
MCHC: 34 g/dL (ref 30.0–36.0)
MCV: 87.6 fL (ref 80.0–100.0)
Platelets: 241 10*3/uL (ref 150–400)
RBC: 4.43 MIL/uL (ref 4.22–5.81)
RDW: 11.7 % (ref 11.5–15.5)
WBC: 4 10*3/uL (ref 4.0–10.5)
nRBC: 0 % (ref 0.0–0.2)

## 2018-10-10 LAB — C-REACTIVE PROTEIN: CRP: 3.3 mg/dL — ABNORMAL HIGH (ref <1.0)

## 2018-10-10 LAB — GLUCOSE, CAPILLARY
Glucose-Capillary: 248 mg/dL — ABNORMAL HIGH (ref 70–99)
Glucose-Capillary: 366 mg/dL — ABNORMAL HIGH (ref 70–99)

## 2018-10-10 LAB — MRSA PCR SCREENING: MRSA by PCR: NEGATIVE

## 2018-10-10 LAB — FERRITIN: Ferritin: 452 ng/mL — ABNORMAL HIGH (ref 24–336)

## 2018-10-10 LAB — D-DIMER, QUANTITATIVE: D-Dimer, Quant: 0.35 ug/mL-FEU (ref 0.00–0.50)

## 2018-10-10 LAB — LACTATE DEHYDROGENASE: LDH: 233 U/L — ABNORMAL HIGH (ref 98–192)

## 2018-10-10 MED ORDER — PREDNISONE 10 MG (48) PO TBPK: ORAL_TABLET | ORAL | 0 refills | Status: DC

## 2018-10-10 MED ORDER — VITAMIN C 250 MG PO TABS: 250.0000 mg | ORAL_TABLET | Freq: Two times a day (BID) | ORAL | Status: AC

## 2018-10-10 MED ORDER — LISINOPRIL 40 MG PO TABS: 40.0000 mg | ORAL_TABLET | Freq: Every morning | ORAL | Status: DC

## 2018-10-10 MED ORDER — PANTOPRAZOLE SODIUM 40 MG PO TBEC: 40.0000 mg | DELAYED_RELEASE_TABLET | Freq: Every day | ORAL | 0 refills | Status: DC

## 2018-10-10 MED ORDER — METHYLPREDNISOLONE SODIUM SUCC 125 MG IJ SOLR
60.0000 mg | Freq: Three times a day (TID) | INTRAMUSCULAR | Status: DC
  Administered 2018-10-10: 13:00:00 60 mg via INTRAVENOUS
  Filled 2018-10-10: qty 2

## 2018-10-10 MED ORDER — AMLODIPINE BESYLATE 10 MG PO TABS: 10.0000 mg | ORAL_TABLET | Freq: Every morning | ORAL | Status: DC

## 2018-10-10 MED ORDER — ORAL CARE MOUTH RINSE
15.0000 mL | Freq: Two times a day (BID) | OROMUCOSAL | Status: DC
  Administered 2018-10-10: 15 mL via OROMUCOSAL

## 2018-10-10 MED ORDER — ZINC 100 MG PO TABS: 100.0000 mg | ORAL_TABLET | Freq: Every day | ORAL | 0 refills | Status: AC

## 2018-10-10 NOTE — Discharge Planning (Signed)
Christopher Roberson, is a 43 y.o. male  DOB [birth year]  MRN [id number].  Admission date:  10/08/2018  Admitting Physician  Kayleen Memos, DO  Discharge Date:  10/10/2018   Primary MD  [place]  Recommendations for primary care physician for things to follow:  - please check CBC, BMP, LFTs during next visit.   Admission Diagnosis  SOB (shortness of breath) [R06.02] Hypoxia [R09.02] Viral pneumonia [J12.9] Close Exposure to Covid-19 Virus [Z20.828] Acute respiratory failure with hypoxia (HCC) [J96.01]   Discharge Diagnosis  SOB (shortness of breath) [R06.02] Hypoxia [R09.02] Viral pneumonia [J12.9] Close Exposure to Covid-19 Virus [Z20.828] Acute respiratory failure with hypoxia ([place]) [J96.01]    Principal Problem:   Acute respiratory failure with hypoxia (HCC) Active Problems:   Suspected Covid-19 Virus Infection   Viral gastroenteritis   Type 2 diabetes mellitus ([place])   Essential hypertension      Past Medical History:  Diagnosis Date  . Diabetes mellitus   . Hypertension   . Stroke [facility])     Past Surgical History:  Procedure Laterality Date  . APPENDECTOMY    . CARPAL TUNNEL RELEASE    . HERNIA REPAIR         History of present illness and  Hospital Course:     Kindly see H&P for history of present illness and admission details, please review complete Labs, Consult reports and Test reports for all details in brief  HPI  from the history and physical done on the day of admission 10/08/2018  HPI: Christopher Roberson is a 43 y.o. male with medical history significant of type 2 diabetes, hypertension, CVA presented to [place] ED with complaints of fever, cough, and shortness of breath.  Patient's wife was admitted to [facility] hospital ICU 5 days ago with COVID-19.  Patient reports 2-day history of fevers, cough productive of yellow/green-colored  sputum, shortness of breath, body aches, headaches, nonbloody nonbilious emesis, and nonbloody diarrhea.  States he has lost his ability to smell and taste.  Reports having slight abdominal discomfort only when vomiting but not otherwise.  ED Course: Temperature 100.9 F.  SPO2 88% on room air, placed on 2 L supplemental oxygen.  COVID-19 rapid test negative.  No leukocytosis.  Lactic acid normal.  LFTs normal.  Blood glucose 212.  Chest x-ray showing mild bibasilar airspace disease, possible pneumonia given COVID-19 exposure.   Hospital Course   Acute Hypoxic respiratory failure due to COVID-19 Pneumonoia -Hypoxic 88% in ED, requiring 2 L nasal cannula, as well intermittently hypoxic overnight(I think that would be more related to his sleep apnea and inability to use CPAP during hospital stay due to his COVID-19 positive), upon his presentation to [facility], he is with no oxygen requirement, he did have significant improvement on IV Solu-Medrol, his oxygen saturation and respiratory status monitored closely, given his rapid improvement of hypoxia and improvement of his dyspnea, there is no indication for either via Remdesivir, will be discharged on prednisone taper given initial hypoxia,  and infiltrate on chest x-ray.   Acute viral gastroenteritis, due to  COVID-19 infection Presenting with a 2-day history of nonbloody nonbilious emesis, nonbloody diarrhea, and other symptoms consistent with a viral illness.  Abdominal exam benign.   -Repeat SARS-CoV-2 test -Significantly improved  Type 2 diabetes  -A1c mildly elevated at 8.1, uncontrolled during hospital stay most likely in the setting of steroids. -Resume home regimen on discharge  Hypertension - blood pressure acceptable, resume metoprolol on discharge, and to resume Norvasc and lisinopril in 1 week, will discontinue  Aldactone/hydrochlorothiazide  Hyperlipidemia -Resume home meds  BPH -Resume home meds     Discharge Condition:   stable   Follow UP  Follow-up Information    [place] [facility] up in 2 week(s).   Specialty: Internal Medicine Contact information: [address] [place] [address] [phone]             Discharge Instructions  and  Discharge Medications     Discharge Instructions    Discharge instructions   Complete by: As directed    Follow with Primary MD [place] in 14 days   Get CBC, CMP,  checked  by Primary MD next visit.    Activity: As tolerated with Full fall precautions use walker/cane & assistance as needed   Disposition Home    Diet: Heart Healthy ,Carb modified, with feeding assistance and aspiration precautions.  For Heart failure patients - Check your Weight same time everyday, if you gain over 2 pounds, or you develop in leg swelling, experience more shortness of breath or chest pain, call your Primary MD immediately. Follow Cardiac Low Salt Diet and 1.5 lit/day fluid restriction.   On your next visit with your primary care physician please Get Medicines reviewed and adjusted.   Please request your Prim.MD to go over all Hospital Tests and Procedure/Radiological results at the follow up, please get all Hospital records sent to your Prim MD by signing hospital release before you go home.   If you experience worsening of your admission symptoms, develop shortness of breath, life threatening emergency, suicidal or homicidal thoughts you must seek medical attention immediately by calling 911 or calling your MD immediately  if symptoms less severe.  You Must read complete instructions/literature along with all the possible adverse reactions/side effects for all the Medicines you take and that have been prescribed to you. Take any new Medicines after you have completely understood and accpet all the possible adverse reactions/side effects.   Do not drive, operating heavy machinery, perform activities at  heights, swimming or participation in water activities or provide baby sitting services if your were admitted for syncope or siezures until you have seen by Primary MD or a Neurologist and advised to do so again.  Do not drive when taking Pain medications.    Do not take more than prescribed Pain, Sleep and Anxiety Medications  Special Instructions: If you have smoked or chewed Tobacco  in the last 2 yrs please stop smoking, stop any regular Alcohol  and or any Recreational drug use.  Wear Seat belts while driving.   Please note  You were cared for by a hospitalist during your hospital stay. If you have any questions about your discharge medications or the care you received while you were in the hospital after you are discharged, you can call the unit and asked to speak with the hospitalist on call if the hospitalist that took care of you is  not available. Once you are discharged, your primary care physician will handle any further medical issues. Please note that NO REFILLS for any discharge medications will be authorized once you are discharged, as it is imperative that you return to your primary care physician (or establish a relationship with a primary care physician if you do not have one) for your aftercare needs so that they can reassess your need for medications and monitor your lab values.   Increase activity slowly   Complete by: As directed      Allergies as of 10/10/2018      Reactions   Percocet [oxycodone-acetaminophen] Itching      Medication List    STOP taking these medications   meloxicam 15 MG tablet Commonly known as: MOBIC   ondansetron 4 MG tablet Commonly known as: ZOFRAN   promethazine 25 MG suppository Commonly known as: PHENERGAN   spironolactone-hydrochlorothiazide 25-25 MG tablet Commonly known as: ALDACTAZIDE     TAKE these medications   albuterol 108 (90 Base) MCG/ACT inhaler Commonly known as: VENTOLIN HFA Inhale 2 puffs into the lungs every 4  (four) hours as needed for wheezing or shortness of breath.   amLODipine 10 MG tablet Commonly known as: NORVASC Take 1 tablet (10 mg total) by mouth every morning. Resume in 1 week Start taking on: October 16, 2018 What changed:   additional instructions  These instructions start on October 16, 2018. If you are unsure what to do until then, ask your doctor or other care provider.   ARIPiprazole 5 MG tablet Commonly known as: ABILIFY Take 5 mg by mouth every morning.   aspirin EC 81 MG tablet Take 81 mg by mouth every morning.   atorvastatin 80 MG tablet Commonly known as: LIPITOR Take 80 mg by mouth every morning.   busPIRone 10 MG tablet Commonly known as: BUSPAR Take 10 mg by mouth 3 (three) times daily.   cyclobenzaprine 5 MG tablet Commonly known as: FLEXERIL Take 5 mg by mouth 3 (three) times daily as needed for muscle spasms.   DULoxetine 30 MG capsule Commonly known as: CYMBALTA Take 30 mg by mouth every morning.   insulin glargine 100 UNIT/ML injection Commonly known as: LANTUS Inject 20-40 Units into the skin See admin instructions. Take 20 units in the morning, and 40 units in the evening   lisinopril 40 MG tablet Commonly known as: ZESTRIL Take 1 tablet (40 mg total) by mouth every morning. Resume in  1 week What changed: additional instructions   metFORMIN 1000 MG tablet Commonly known as: GLUCOPHAGE Take 1,000 mg by mouth 2 (two) times a day.   metoprolol succinate 50 MG 24 hr tablet Commonly known as: TOPROL-XL Take 50 mg by mouth daily.   NovoLOG FlexPen 100 UNIT/ML FlexPen Generic drug: insulin aspart Inject 14 Units into the skin 3 (three) times daily between meals.   Ozempic (0.25 or 0.5 MG/DOSE) 2 MG/1.5ML Sopn Generic drug: Semaglutide(0.25 or 0.5MG /DOS) Inject 0.25 mg into the skin every Monday.   pantoprazole 40 MG tablet Commonly known as: Protonix Take 1 tablet (40 mg total) by mouth daily for 30 days.   predniSONE 10 MG (48) Tbpk  tablet Commonly known as: STERAPRED UNI-PAK 48 TAB Please use per package instruction   pregabalin 150 MG capsule Commonly known as: LYRICA Take 150 mg by mouth 2 (two) times a day.   sertraline 100 MG tablet Commonly known as: ZOLOFT Take 200 mg by mouth daily.   terazosin 5 MG capsule  Commonly known as: HYTRIN Take 5 mg by mouth at bedtime.   traZODone 100 MG tablet Commonly known as: DESYREL Take 100-300 mg by mouth at bedtime as needed for sleep. What changed: Another medication with the same name was removed. Continue taking this medication, and follow the directions you see here.   vitamin C 250 MG tablet Commonly known as: ASCORBIC ACID Take 1 tablet (250 mg total) by mouth 2 (two) times daily for 14 days.   Vitamin D (Ergocalciferol) 1.25 MG (50000 UT) Caps capsule Commonly known as: DRISDOL Take 50,000 Units by mouth every Monday, Wednesday, and Friday.   Zinc 100 MG Tabs Take 1 tablet (100 mg total) by mouth daily for 14 days.         Diet and Activity recommendation: See Discharge Instructions above   Consults obtained -  none   Major procedures and Radiology Reports - PLEASE review detailed and final reports for all details, in brief -     Dg Chest Port 1 View  Result Date: 10/08/2018 CLINICAL DATA:  Short of breath. Cough and fever. Exposure to COVID-19 EXAM: PORTABLE CHEST 1 VIEW COMPARISON:  03/20/2018 FINDINGS: Mild bilateral airspace disease left greater than right could represent pneumonia or edema. Cardiac enlargement. No pleural effusion. IMPRESSION: Mild bibasilar airspace disease, possible pneumonia given COVID-19 exposure. Electronically Signed   By: Franchot Gallo M.D.   On: 10/08/2018 20:02    Micro Results    Recent Results (from the past 240 hour(s))  SARS Coronavirus 2 (Hosp order,Performed in [facility] lab via [employer] ID)     Status: None   Collection Time: 10/08/18  8:53 PM   Specimen: Dry Nasal Swab ([employer] ID Now)  Result  Value Ref Range Status   SARS Coronavirus 2 ([employer] Now) NEGATIVE NEGATIVE Final    Comment: (NOTE) Interpretive Result Comment(s): COVID 19 Positive SARS CoV 2 target nucleic acids are DETECTED. The SARS CoV 2 RNA is generally detectable in upper and lower respiratory specimens during the acute phase of infection.  Positive results are indicative of active infection with SARS CoV 2.  Clinical correlation with patient history and other diagnostic information is necessary to determine patient infection status.  Positive results do not rule out bacterial infection or coinfection with other viruses. The expected result is Negative. COVID 19 Negative SARS CoV 2 target nucleic acids are NOT DETECTED. The SARS CoV 2 RNA is generally detectable in upper and lower respiratory specimens during the acute phase of infection.  Negative results do not preclude SARS CoV 2 infection, do not rule out coinfections with other pathogens, and should not be used as the sole basis for treatment or other patient management decisions.  Negative results must be combined with clinical  observations, patient history, and epidemiological information. The expected result is Negative. Invalid Presence or absence of SARS CoV 2 nucleic acids cannot be determined. Repeat testing was performed on the submitted specimen and repeated Invalid results were obtained.  If clinically indicated, additional testing on a new specimen with an alternate test methodology [id number] is advised.  The SARS CoV 2 RNA is generally detectable in upper and lower respiratory specimens during the acute phase of infection. The expected result is Negative. Fact Sheet for Patients:  GolfingFamily.no Fact Sheet for Healthcare Providers: https://www.hernandez-brewer.com/ This test is not yet approved or cleared by the [place] FDA and has been authorized for detection and/or diagnosis of SARS CoV 2  by FDA under an Emergency Use Authorization (  EUA).  This EUA will remain in effect (meaning this test can be used) for the duration of the COVID19 d eclaration under Section 564(b)(1) of the Act, 21 U.S.C. section [phone] 3(b)(1), unless the authorization is terminated or revoked sooner. Performed at [facility], [address] [place], [place] [address]   SARS Coronavirus 2     Status: Abnormal   Collection Time: 10/09/18  6:02 AM  Result Value Ref Range Status   SARS Coronavirus 2 DETECTED (A) NOT DETECTED Final    Comment: RESULT CALLED TO, READ BACK BY AND VERIFIED WITH: RN B BOYD [id number] AT 736 AM BY CM (NOTE) SARS-CoV-2 target nucleic acids are DETECTED. The SARS-CoV-2 RNA is generally detectable in upper and lower respiratory specimens during the acute phase of infection. Positive results are indicative of active infection with SARS-CoV-2. Clinical  correlation with patient history and other diagnostic information is necessary to determine patient infection status. Positive results do  not rule out bacterial infection or co-infection with other viruses. The expected result is Not Detected. Fact Sheet for Patients: http://www.biofiredefense.com/wp-content/uploads/2020/03/BIOFIRE-COVID -19-patients.pdf Fact Sheet for Healthcare Providers: http://www.biofiredefense.com/wp-content/uploads/2020/03/BIOFIRE-COVID -19-hcp.pdf This test is not yet approved or cleared by the [place] and  has been authorized for detection and/or diagnosis of SARS-CoV-2 by FDA under an Emergency Use A uthorization (EUA).  This EUA will remain in effect (meaning this test can be used) for the duration of  the COVID-19 declaration under Section 564(b)(1) of the Act, 21 U.S.C. section [phone] 3(b)(1), unless the authorization is terminated or revoked sooner. Performed at [facility], [place] [address], [place], [place] [address]   MRSA PCR Screening     Status: None    Collection Time: 10/09/18  9:22 PM   Specimen: Nasal Mucosa; Nasopharyngeal  Result Value Ref Range Status   MRSA by PCR NEGATIVE NEGATIVE Final    Comment:        The GeneXpert MRSA Assay (FDA approved for NASAL specimens only), is one component of a comprehensive MRSA colonization surveillance program. It is not intended to diagnose MRSA infection nor to guide or monitor treatment for MRSA infections. Performed at [facility], [place] [address] [place], [place] [address]        Today   Subjective:   Hubbard Seldon today has no headache,no chest abdominal pain,no new weakness tingling or numbness, feels much better wants to go home today.   Objective:   Blood pressure 128/87, pulse 83, temperature 98.5 F (36.9 C), temperature source Oral, resp. rate 13, height 5' 7.99" (1.727 m), weight 135.3 kg, SpO2 96 %.   Intake/Output Summary (Last 24 hours) at 10/10/2018 1201 Last data filed at 10/10/2018 0900 Gross per 24 hour  Intake 1609.01 ml  Output 1450 ml  Net 159.01 ml    Exam Awake Alert, Oriented x 3, No new F.N deficits, Normal affect Symmetrical Chest wall movement, Good air movement bilaterally, CTAB RRR,No Gallops,Rubs or new Murmurs, No Parasternal Heave +ve B.Sounds, Abd Soft, Non tender, No rebound -guarding or rigidity. No Cyanosis, Clubbing or edema, No new Rash or bruise  Data Review   CBC w Diff:  Lab Results  Component Value Date   WBC 4.0 10/10/2018   HGB 13.2 10/10/2018   HCT 38.8 (L) 10/10/2018   PLT 241 10/10/2018   LYMPHOPCT 24 10/08/2018   MONOPCT 9 10/08/2018   EOSPCT 0 10/08/2018   BASOPCT 0 10/08/2018    CMP:  Lab Results  Component  Value Date   NA 137 10/10/2018   K 4.1 10/10/2018   CL 101 10/10/2018   CO2 26 10/10/2018   BUN 12 10/10/2018   CREATININE 0.76 10/10/2018   PROT 7.4 10/10/2018   ALBUMIN 3.8 10/10/2018   BILITOT 0.7 10/10/2018   ALKPHOS 44 10/10/2018   AST 23 10/10/2018   ALT 34 10/10/2018   .   Total Time in preparing paper work, data evaluation and todays exam - 51 minutes  Phillips Climes M.D on 10/10/2018 at 12:01 PM  [facility]   Office  [phone]

## 2018-10-10 NOTE — Progress Notes (Signed)
Patients wife is in the hospital, therefor he requests that his sister be the point of contact for updates.  Beckie Busing [phone]

## 2018-10-10 NOTE — Discharge Instructions (Signed)
Person Under Monitoring Name: Christopher Roberson  Location: [place] [id number]   Infection Prevention Recommendations for Individuals Confirmed to have, or Being Evaluated for, 2019 Novel Coronavirus (COVID-19) Infection Who Receive Care at Home  Individuals who are confirmed to have, or are being evaluated for, COVID-19 should follow the prevention steps below until a healthcare provider or local or state health department says they can return to normal activities.  Stay home except to get medical care You should restrict activities outside your home, except for getting medical care. Do not go to work, school, or public areas, and do not use public transportation or taxis.  Call ahead before visiting your doctor Before your medical appointment, call the healthcare provider and tell them that you have, or are being evaluated for, COVID-19 infection. This will help the healthcare providers office take steps to keep other people from getting infected. Ask your healthcare provider to call the local or state health department.  Monitor your symptoms Seek prompt medical attention if your illness is worsening (e.g., difficulty breathing). Before going to your medical appointment, call the healthcare provider and tell them that you have, or are being evaluated for, COVID-19 infection. Ask your healthcare provider to call the local or state health department.  Wear a facemask You should wear a facemask that covers your nose and mouth when you are in the same room with other people and when you visit a healthcare provider. People who live with or visit you should also wear a facemask while they are in the same room with you.  Separate yourself from other people in your home As much as possible, you should stay in a different room from other people in your home. Also, you should use a separate bathroom, if available.  Avoid sharing household items You should not share  dishes, drinking glasses, cups, eating utensils, towels, bedding, or other items with other people in your home. After using these items, you should wash them thoroughly with soap and water.  Cover your coughs and sneezes Cover your mouth and nose with a tissue when you cough or sneeze, or you can cough or sneeze into your sleeve. Throw used tissues in a lined trash can, and immediately wash your hands with soap and water for at least 20 seconds or use an alcohol-based hand rub.  Wash your [facility] your hands often and thoroughly with soap and water for at least 20 seconds. You can use an alcohol-based hand sanitizer if soap and water are not available and if your hands are not visibly dirty. Avoid touching your eyes, nose, and mouth with unwashed hands.   Prevention Steps for Caregivers and Household Members of Individuals Confirmed to have, or Being Evaluated for, COVID-19 Infection Being Cared for in the Home  If you live with, or provide care at home for, a person confirmed to have, or being evaluated for, COVID-19 infection please follow these guidelines to prevent infection:  Follow healthcare providers instructions Make sure that you understand and can help the patient follow any healthcare provider instructions for all care.  Provide for the patients basic needs You should help the patient with basic needs in the home and provide support for getting groceries, prescriptions, and other personal needs.  Monitor the patients symptoms If they are getting sicker, call his or her medical provider and tell them that the patient has, or is being evaluated for, COVID-19 infection. This will help the healthcare providers office  take steps to keep other people from getting infected. Ask the healthcare provider to call the local or state health department.  Limit the number of people who have contact with the patient  If possible, have only one caregiver for the patient.  Other  household members should stay in another home or place of residence. If this is not possible, they should stay  in another room, or be separated from the patient as much as possible. Use a separate bathroom, if available.  Restrict visitors who do not have an essential need to be in the home.  Keep older adults, very young children, and other sick people away from the patient Keep older adults, very young children, and those who have compromised immune systems or chronic health conditions away from the patient. This includes people with chronic heart, lung, or kidney conditions, diabetes, and cancer.  Ensure good ventilation Make sure that shared spaces in the home have good air flow, such as from an air conditioner or an opened window, weather permitting.  Wash your hands often  Wash your hands often and thoroughly with soap and water for at least 20 seconds. You can use an alcohol based hand sanitizer if soap and water are not available and if your hands are not visibly dirty.  Avoid touching your eyes, nose, and mouth with unwashed hands.  Use disposable paper towels to dry your hands. If not available, use dedicated cloth towels and replace them when they become wet.  Wear a facemask and gloves  Wear a disposable facemask at all times in the room and gloves when you touch or have contact with the patients blood, body fluids, and/or secretions or excretions, such as sweat, saliva, sputum, nasal mucus, vomit, urine, or feces.  Ensure the mask fits over your nose and mouth tightly, and do not touch it during use.  Throw out disposable facemasks and gloves after using them. Do not reuse.  Wash your hands immediately after removing your facemask and gloves.  If your personal clothing becomes contaminated, carefully remove clothing and launder. Wash your hands after handling contaminated clothing.  Place all used disposable facemasks, gloves, and other waste in a lined container before  disposing them with other household waste.  Remove gloves and wash your hands immediately after handling these items.  Do not share dishes, glasses, or other household items with the patient  Avoid sharing household items. You should not share dishes, drinking glasses, cups, eating utensils, towels, bedding, or other items with a patient who is confirmed to have, or being evaluated for, COVID-19 infection.  After the person uses these items, you should wash them thoroughly with soap and water.  Wash laundry thoroughly  Immediately remove and wash clothes or bedding that have blood, body fluids, and/or secretions or excretions, such as sweat, saliva, sputum, nasal mucus, vomit, urine, or feces, on them.  Wear gloves when handling laundry from the patient.  Read and follow directions on labels of laundry or clothing items and detergent. In general, wash and dry with the warmest temperatures recommended on the label.  Clean all areas the individual has used often  Clean all touchable surfaces, such as counters, tabletops, doorknobs, bathroom fixtures, toilets, phones, keyboards, tablets, and bedside tables, every day. Also, clean any surfaces that may have blood, body fluids, and/or secretions or excretions on them.  Wear gloves when cleaning surfaces the patient has come in contact with.  Use a diluted bleach solution (e.g., dilute bleach with 1 part  bleach and 10 parts water) or a household disinfectant with a label that says EPA-registered for coronaviruses. To make a bleach solution at home, add 1 tablespoon of bleach to 1 quart (4 cups) of water. For a larger supply, add  cup of bleach to 1 gallon (16 cups) of water.  Read labels of cleaning products and follow recommendations provided on product labels. Labels contain instructions for safe and effective use of the cleaning product including precautions you should take when applying the product, such as wearing gloves or eye protection  and making sure you have good ventilation during use of the product.  Remove gloves and wash hands immediately after cleaning.  Monitor yourself for signs and symptoms of illness Caregivers and household members are considered close contacts, should monitor their health, and will be asked to limit movement outside of the home to the extent possible. Follow the monitoring steps for close contacts listed on the symptom monitoring form.   ? If you have additional questions, contact your local health department or call the epidemiologist on call at [phone] (available 24/7). ? This guidance is subject to change. For the most up-to-date guidance from [facility], please refer to their website: [email or web address]

## 2018-10-10 NOTE — Progress Notes (Signed)
Patient discharged to home, AVS was reviewed and all questions answered. V/S stable, non complaints. Reviewed quarantine recommendations and contract signed and placed in chart. Patient was assisted to the exit and family provided transportation.

## 2018-10-11 NOTE — Discharge Summary (Signed)
Christopher Roberson, is a 43 y.o. male  DOB November 04, 1975  MRN [id number].  Admission date:  10/08/2018  Admitting Physician  Kayleen Memos, DO  Discharge Date: 10/10/2018  Primary MD  [place]  Recommendations for primary care physician for things to follow:  - please check CBC, BMP, LFTs during next visit.   Admission Diagnosis  SOB (shortness of breath) [R06.02] Hypoxia [R09.02] Viral pneumonia [J12.9] Close Exposure to Covid-19 Virus [Z20.828] Acute respiratory failure with hypoxia (HCC) [J96.01]   Discharge Diagnosis  SOB (shortness of breath) [R06.02] Hypoxia [R09.02] Viral pneumonia [J12.9] Close Exposure to Covid-19 Virus [Z20.828] Acute respiratory failure with hypoxia ([place]) [J96.01]    Principal Problem:   Acute respiratory failure with hypoxia (HCC) Active Problems:   Suspected Covid-19 Virus Infection   Viral gastroenteritis   Type 2 diabetes mellitus ([place])   Essential hypertension   COVID-19 virus infection      Past Medical History:  Diagnosis Date  . Diabetes mellitus   . Hypertension   . Stroke [facility])     Past Surgical History:  Procedure Laterality Date  . APPENDECTOMY    . CARPAL TUNNEL RELEASE    . HERNIA REPAIR         History of present illness and  Hospital Course:     Kindly see H&P for history of present illness and admission details, please review complete Labs, Consult reports and Test reports for all details in brief  HPI  from the history and physical done on the day of admission 10/08/2018  HPI: Christopher Roberson is a 43 y.o. male with medical history significant of type 2 diabetes, hypertension, CVA presented to [place] ED with complaints of fever, cough, and shortness of breath.  Patient's wife was admitted to [facility] hospital ICU 5 days ago with COVID-19.  Patient reports 2-day history of fevers, cough productive  of yellow/green-colored sputum, shortness of breath, body aches, headaches, nonbloody nonbilious emesis, and nonbloody diarrhea.  States he has lost his ability to smell and taste.  Reports having slight abdominal discomfort only when vomiting but not otherwise.  ED Course: Temperature 100.9 F.  SPO2 88% on room air, placed on 2 L supplemental oxygen.  COVID-19 rapid test negative.  No leukocytosis.  Lactic acid normal.  LFTs normal.  Blood glucose 212.  Chest x-ray showing mild bibasilar airspace disease, possible pneumonia given COVID-19 exposure.   Hospital Course   Acute Hypoxic respiratory failure due to COVID-19 Pneumonoia -Hypoxic 88% in ED, requiring 2 L nasal cannula, as well intermittently hypoxic overnight(I think that would be more related to his sleep apnea and inability to use CPAP during hospital stay due to his COVID-19 positive), upon his presentation to [facility], he is with no oxygen requirement, he did have significant improvement on IV Solu-Medrol, his oxygen saturation and respiratory status monitored closely, given his rapid improvement of hypoxia and improvement of his dyspnea, there is no indication for either via Remdesivir, will be discharged on prednisone taper  given initial hypoxia, and infiltrate on chest x-ray.   Acute viral gastroenteritis, due to  COVID-19 infection Presenting with a 2-day history of nonbloody nonbilious emesis, nonbloody diarrhea, and other symptoms consistent with a viral illness.  Abdominal exam benign.   -Repeat SARS-CoV-2 test -Significantly improved  Type 2 diabetes  -A1c mildly elevated at 8.1, uncontrolled during hospital stay most likely in the setting of steroids. -Resume home regimen on discharge  Hypertension - blood pressure acceptable, resume metoprolol on discharge, and to resume Norvasc and lisinopril in 1 week, will discontinue  Aldactone/hydrochlorothiazide  Hyperlipidemia -Resume home meds  BPH -Resume home meds      Discharge Condition:  stable   Follow UP  Follow-up Information    [place] Follow up in 2 week(s).   Specialty: Internal Medicine Contact information: [address] [place] [address] [phone]             Discharge Instructions  and  Discharge Medications     Discharge Instructions    Discharge instructions   Complete by: As directed    Follow with Primary MD [place] in 14 days   Get CBC, CMP,  checked  by Primary MD next visit.    Activity: As tolerated with Full fall precautions use walker/cane & assistance as needed   Disposition Home    Diet: Heart Healthy ,Carb modified, with feeding assistance and aspiration precautions.  For Heart failure patients - Check your Weight same time everyday, if you gain over 2 pounds, or you develop in leg swelling, experience more shortness of breath or chest pain, call your Primary MD immediately. Follow Cardiac Low Salt Diet and 1.5 lit/day fluid restriction.   On your next visit with your primary care physician please Get Medicines reviewed and adjusted.   Please request your Prim.MD to go over all Hospital Tests and Procedure/Radiological results at the follow up, please get all Hospital records sent to your Prim MD by signing hospital release before you go home.   If you experience worsening of your admission symptoms, develop shortness of breath, life threatening emergency, suicidal or homicidal thoughts you must seek medical attention immediately by calling 911 or calling your MD immediately  if symptoms less severe.  You Must read complete instructions/literature along with all the possible adverse reactions/side effects for all the Medicines you take and that have been prescribed to you. Take any new Medicines after you have completely understood and accpet all the possible adverse reactions/side effects.   Do not drive, operating heavy machinery,  perform activities at heights, swimming or participation in water activities or provide baby sitting services if your were admitted for syncope or siezures until you have seen by Primary MD or a Neurologist and advised to do so again.  Do not drive when taking Pain medications.    Do not take more than prescribed Pain, Sleep and Anxiety Medications  Special Instructions: If you have smoked or chewed Tobacco  in the last 2 yrs please stop smoking, stop any regular Alcohol  and or any Recreational drug use.  Wear Seat belts while driving.   Please note  You were cared for by a hospitalist during your hospital stay. If you have any questions about your discharge medications or the care you received while you were in the hospital after you are discharged, you can call the unit and asked to speak with the hospitalist on call if the hospitalist that took care  of you is not available. Once you are discharged, your primary care physician will handle any further medical issues. Please note that NO REFILLS for any discharge medications will be authorized once you are discharged, as it is imperative that you return to your primary care physician (or establish a relationship with a primary care physician if you do not have one) for your aftercare needs so that they can reassess your need for medications and monitor your lab values.   Increase activity slowly   Complete by: As directed      Allergies as of 10/10/2018      Reactions   Percocet [oxycodone-acetaminophen] Itching      Medication List    STOP taking these medications   meloxicam 15 MG tablet Commonly known as: MOBIC   ondansetron 4 MG tablet Commonly known as: ZOFRAN   promethazine 25 MG suppository Commonly known as: PHENERGAN   spironolactone-hydrochlorothiazide 25-25 MG tablet Commonly known as: ALDACTAZIDE     TAKE these medications   albuterol 108 (90 Base) MCG/ACT inhaler Commonly known as: VENTOLIN HFA Inhale 2 puffs  into the lungs every 4 (four) hours as needed for wheezing or shortness of breath.   amLODipine 10 MG tablet Commonly known as: NORVASC Take 1 tablet (10 mg total) by mouth every morning. Resume in 1 week Start taking on: October 16, 2018 What changed:   additional instructions  These instructions start on October 16, 2018. If you are unsure what to do until then, ask your doctor or other care provider.   ARIPiprazole 5 MG tablet Commonly known as: ABILIFY Take 5 mg by mouth every morning.   aspirin EC 81 MG tablet Take 81 mg by mouth every morning.   atorvastatin 80 MG tablet Commonly known as: LIPITOR Take 80 mg by mouth every morning.   busPIRone 10 MG tablet Commonly known as: BUSPAR Take 10 mg by mouth 3 (three) times daily.   cyclobenzaprine 5 MG tablet Commonly known as: FLEXERIL Take 5 mg by mouth 3 (three) times daily as needed for muscle spasms.   DULoxetine 30 MG capsule Commonly known as: CYMBALTA Take 30 mg by mouth every morning.   insulin glargine 100 UNIT/ML injection Commonly known as: LANTUS Inject 20-40 Units into the skin See admin instructions. Take 20 units in the morning, and 40 units in the evening   lisinopril 40 MG tablet Commonly known as: ZESTRIL Take 1 tablet (40 mg total) by mouth every morning. Resume in  1 week What changed: additional instructions   metFORMIN 1000 MG tablet Commonly known as: GLUCOPHAGE Take 1,000 mg by mouth 2 (two) times a day.   metoprolol succinate 50 MG 24 hr tablet Commonly known as: TOPROL-XL Take 50 mg by mouth daily.   NovoLOG FlexPen 100 UNIT/ML FlexPen Generic drug: insulin aspart Inject 14 Units into the skin 3 (three) times daily between meals.   Ozempic (0.25 or 0.5 MG/DOSE) 2 MG/1.5ML Sopn Generic drug: Semaglutide(0.25 or 0.5MG /DOS) Inject 0.25 mg into the skin every Monday.   pantoprazole 40 MG tablet Commonly known as: Protonix Take 1 tablet (40 mg total) by mouth daily for 30 days.    predniSONE 10 MG (48) Tbpk tablet Commonly known as: STERAPRED UNI-PAK 48 TAB Please use per package instruction Notes to patient: Steroid- please closely monitor your blood sugar   pregabalin 150 MG capsule Commonly known as: LYRICA Take 150 mg by mouth 2 (two) times a day.   sertraline 100 MG tablet Commonly known as:  ZOLOFT Take 200 mg by mouth daily.   terazosin 5 MG capsule Commonly known as: HYTRIN Take 5 mg by mouth at bedtime.   traZODone 100 MG tablet Commonly known as: DESYREL Take 100-300 mg by mouth at bedtime as needed for sleep. What changed: Another medication with the same name was removed. Continue taking this medication, and follow the directions you see here.   vitamin C 250 MG tablet Commonly known as: ASCORBIC ACID Take 1 tablet (250 mg total) by mouth 2 (two) times daily for 14 days.   Vitamin D (Ergocalciferol) 1.25 MG (50000 UT) Caps capsule Commonly known as: DRISDOL Take 50,000 Units by mouth every Monday, Wednesday, and Friday.   Zinc 100 MG Tabs Take 1 tablet (100 mg total) by mouth daily for 14 days.         Diet and Activity recommendation: See Discharge Instructions above   Consults obtained -  none   Major procedures and Radiology Reports - PLEASE review detailed and final reports for all details, in brief -     Dg Chest Port 1 View  Result Date: 10/08/2018 CLINICAL DATA:  Short of breath. Cough and fever. Exposure to COVID-19 EXAM: PORTABLE CHEST 1 VIEW COMPARISON:  03/20/2018 FINDINGS: Mild bilateral airspace disease left greater than right could represent pneumonia or edema. Cardiac enlargement. No pleural effusion. IMPRESSION: Mild bibasilar airspace disease, possible pneumonia given COVID-19 exposure. Electronically Signed   By: Franchot Gallo M.D.   On: 10/08/2018 20:02    Micro Results    Recent Results (from the past 240 hour(s))  SARS Coronavirus 2 (Hosp order,Performed in [facility] lab via [employer] ID)     Status:  None   Collection Time: 10/08/18  8:53 PM   Specimen: Dry Nasal Swab ([employer] ID Now)  Result Value Ref Range Status   SARS Coronavirus 2 ([employer] ID Now) NEGATIVE NEGATIVE Final    Comment: (NOTE) Interpretive Result Comment(s): COVID 19 Positive SARS CoV 2 target nucleic acids are DETECTED. The SARS CoV 2 RNA is generally detectable in upper and lower respiratory specimens during the acute phase of infection.  Positive results are indicative of active infection with SARS CoV 2.  Clinical correlation with patient history and other diagnostic information is necessary to determine patient infection status.  Positive results do not rule out bacterial infection or coinfection with other viruses. The expected result is Negative. COVID 19 Negative SARS CoV 2 target nucleic acids are NOT DETECTED. The SARS CoV 2 RNA is generally detectable in upper and lower respiratory specimens during the acute phase of infection.  Negative results do not preclude SARS CoV 2 infection, do not rule out coinfections with other pathogens, and should not be used as the sole basis for treatment or other patient management decisions.  Negative results must be combined with clinical  observations, patient history, and epidemiological information. The expected result is Negative. Invalid Presence or absence of SARS CoV 2 nucleic acids cannot be determined. Repeat testing was performed on the submitted specimen and repeated Invalid results were obtained.  If clinically indicated, additional testing on a new specimen with an alternate test methodology [phone]) is advised.  The SARS CoV 2 RNA is generally detectable in upper and lower respiratory specimens during the acute phase of infection. The expected result is Negative. Fact Sheet for Patients:  GolfingFamily.no Fact Sheet for Healthcare Providers: https://www.hernandez-brewer.com/ This test is not yet approved or cleared  by the [place] FDA and has been authorized for detection  and/or diagnosis of SARS CoV 2 by FDA under an Emergency Use Authorization (EUA).  This EUA will remain in effect (meaning this test can be used) for the duration of the COVID19 d eclaration under Section 564(b)(1) of the Act, 21 U.S.C. section [phone] 3(b)(1), unless the authorization is terminated or revoked sooner. Performed at [facility], [address] [place], [place] [address]   SARS Coronavirus 2     Status: Abnormal   Collection Time: 10/09/18  6:02 AM  Result Value Ref Range Status   SARS Coronavirus 2 DETECTED (A) NOT DETECTED Final    Comment: RESULT CALLED TO, READ BACK BY AND VERIFIED WITH: RN B BOYD [id number] AT 736 AM BY CM (NOTE) SARS-CoV-2 target nucleic acids are DETECTED. The SARS-CoV-2 RNA is generally detectable in upper and lower respiratory specimens during the acute phase of infection. Positive results are indicative of active infection with SARS-CoV-2. Clinical  correlation with patient history and other diagnostic information is necessary to determine patient infection status. Positive results do  not rule out bacterial infection or co-infection with other viruses. The expected result is Not Detected. Fact Sheet for Patients: http://www.biofiredefense.com/wp-content/uploads/2020/03/BIOFIRE-COVID -19-patients.pdf Fact Sheet for Healthcare Providers: http://www.biofiredefense.com/wp-content/uploads/2020/03/BIOFIRE-COVID -19-hcp.pdf This test is not yet approved or cleared by the [place] and  has been authorized for detection and/or diagnosis of SARS-CoV-2 by FDA under an Emergency Use A uthorization (EUA).  This EUA will remain in effect (meaning this test can be used) for the duration of  the COVID-19 declaration under Section 564(b)(1) of the Act, 21 U.S.C. section [phone] 3(b)(1), unless the authorization is terminated or revoked sooner. Performed at [facility], [place] [address], [place], [place] [address]   MRSA PCR Screening     Status: None   Collection Time: 10/09/18  9:22 PM   Specimen: Nasal Mucosa; Nasopharyngeal  Result Value Ref Range Status   MRSA by PCR NEGATIVE NEGATIVE Final    Comment:        The GeneXpert MRSA Assay (FDA approved for NASAL specimens only), is one component of a comprehensive MRSA colonization surveillance program. It is not intended to diagnose MRSA infection nor to guide or monitor treatment for MRSA infections. Performed at [facility], [place] [address], [place], [place] [address]        Today   Subjective:   Christopher Roberson today has no headache,no chest abdominal pain,no new weakness tingling or numbness, feels much better wants to go home today.   Objective:   Blood pressure 128/87, pulse 83, temperature 98.5 F (36.9 C), temperature source Oral, resp. rate 13, height 5' 7.99" (1.727 m), weight 135.3 kg, SpO2 96 %.  No intake or output data in the 24 hours ending 10/11/18 1654  Exam Awake Alert, Oriented x 3, No new F.N deficits, Normal affect Symmetrical Chest wall movement, Good air movement bilaterally, CTAB RRR,No Gallops,Rubs or new Murmurs, No Parasternal Heave +ve B.Sounds, Abd Soft, Non tender, No rebound -guarding or rigidity. No Cyanosis, Clubbing or edema, No new Rash or bruise  Data Review   CBC w Diff:  Lab Results  Component Value Date   WBC 4.0 10/10/2018   HGB 13.2 10/10/2018   HCT 38.8 (L) 10/10/2018   PLT 241 10/10/2018   LYMPHOPCT 24 10/08/2018   MONOPCT 9 10/08/2018   EOSPCT 0 10/08/2018   BASOPCT 0 10/08/2018    CMP:  Lab Results  Component Value Date   NA 137 10/10/2018  K 4.1 10/10/2018   CL 101 10/10/2018   CO2 26 10/10/2018   BUN 12 10/10/2018   CREATININE 0.76 10/10/2018   PROT 7.4 10/10/2018   ALBUMIN 3.8 10/10/2018   BILITOT 0.7 10/10/2018   ALKPHOS 44 10/10/2018   AST 23 10/10/2018   ALT 34 10/10/2018  .    Total Time in preparing paper work, data evaluation and todays exam - 42 minutes  Phillips Climes M.D [facility]   Office  [phone]

## 2018-11-04 DIAGNOSIS — M5030 Other cervical disc degeneration, unspecified cervical region: Secondary | ICD-10-CM | POA: Insufficient documentation

## 2018-12-07 DIAGNOSIS — Z95818 Presence of other cardiac implants and grafts: Secondary | ICD-10-CM | POA: Insufficient documentation

## 2019-02-11 DIAGNOSIS — G5622 Lesion of ulnar nerve, left upper limb: Secondary | ICD-10-CM | POA: Insufficient documentation

## 2019-03-04 DIAGNOSIS — G5621 Lesion of ulnar nerve, right upper limb: Secondary | ICD-10-CM | POA: Insufficient documentation

## 2020-02-26 DIAGNOSIS — G5601 Carpal tunnel syndrome, right upper limb: Secondary | ICD-10-CM | POA: Insufficient documentation

## 2020-05-22 DIAGNOSIS — R202 Paresthesia of skin: Secondary | ICD-10-CM | POA: Insufficient documentation

## 2020-07-30 DIAGNOSIS — G5621 Lesion of ulnar nerve, right upper limb: Secondary | ICD-10-CM | POA: Insufficient documentation

## 2021-04-28 ENCOUNTER — Encounter (HOSPITAL_BASED_OUTPATIENT_CLINIC_OR_DEPARTMENT_OTHER): Payer: Self-pay | Admitting: *Deleted

## 2021-04-28 ENCOUNTER — Emergency Department (HOSPITAL_BASED_OUTPATIENT_CLINIC_OR_DEPARTMENT_OTHER): Payer: Medicare Other

## 2021-04-28 ENCOUNTER — Other Ambulatory Visit: Payer: Self-pay

## 2021-04-28 ENCOUNTER — Inpatient Hospital Stay (HOSPITAL_BASED_OUTPATIENT_CLINIC_OR_DEPARTMENT_OTHER)
Admission: EM | Admit: 2021-04-28 | Discharge: 2021-04-30 | DRG: 065 | Disposition: A | Discharge status: Home / Self Care | Payer: Medicare Other | Attending: Internal Medicine | Admitting: Internal Medicine

## 2021-04-28 DIAGNOSIS — Z885 Allergy status to narcotic agent status: Secondary | ICD-10-CM

## 2021-04-28 DIAGNOSIS — Z20822 Contact with and (suspected) exposure to covid-19: Secondary | ICD-10-CM | POA: Diagnosis present

## 2021-04-28 DIAGNOSIS — Z7984 Long term (current) use of oral hypoglycemic drugs: Secondary | ICD-10-CM

## 2021-04-28 DIAGNOSIS — G8191 Hemiplegia, unspecified affecting right dominant side: Secondary | ICD-10-CM | POA: Diagnosis present

## 2021-04-28 DIAGNOSIS — I6381 Other cerebral infarction due to occlusion or stenosis of small artery: Principal | ICD-10-CM | POA: Diagnosis present

## 2021-04-28 DIAGNOSIS — Z23 Encounter for immunization: Secondary | ICD-10-CM

## 2021-04-28 DIAGNOSIS — Z8249 Family history of ischemic heart disease and other diseases of the circulatory system: Secondary | ICD-10-CM

## 2021-04-28 DIAGNOSIS — Z8673 Personal history of transient ischemic attack (TIA), and cerebral infarction without residual deficits: Secondary | ICD-10-CM | POA: Diagnosis present

## 2021-04-28 DIAGNOSIS — T6701XA Heatstroke and sunstroke, initial encounter: Secondary | ICD-10-CM

## 2021-04-28 DIAGNOSIS — G4733 Obstructive sleep apnea (adult) (pediatric): Secondary | ICD-10-CM | POA: Diagnosis present

## 2021-04-28 DIAGNOSIS — E1165 Type 2 diabetes mellitus with hyperglycemia: Secondary | ICD-10-CM

## 2021-04-28 DIAGNOSIS — I10 Essential (primary) hypertension: Secondary | ICD-10-CM | POA: Diagnosis present

## 2021-04-28 DIAGNOSIS — R29702 NIHSS score 2: Secondary | ICD-10-CM | POA: Diagnosis present

## 2021-04-28 DIAGNOSIS — Z79899 Other long term (current) drug therapy: Secondary | ICD-10-CM

## 2021-04-28 DIAGNOSIS — R29810 Facial weakness: Secondary | ICD-10-CM | POA: Diagnosis present

## 2021-04-28 DIAGNOSIS — E119 Type 2 diabetes mellitus without complications: Secondary | ICD-10-CM

## 2021-04-28 DIAGNOSIS — Z794 Long term (current) use of insulin: Secondary | ICD-10-CM

## 2021-04-28 DIAGNOSIS — Z7982 Long term (current) use of aspirin: Secondary | ICD-10-CM

## 2021-04-28 DIAGNOSIS — E7800 Pure hypercholesterolemia, unspecified: Secondary | ICD-10-CM | POA: Diagnosis present

## 2021-04-28 DIAGNOSIS — R531 Weakness: Secondary | ICD-10-CM | POA: Diagnosis not present

## 2021-04-28 DIAGNOSIS — E785 Hyperlipidemia, unspecified: Secondary | ICD-10-CM | POA: Diagnosis present

## 2021-04-28 DIAGNOSIS — M5011 Cervical disc disorder with radiculopathy,  high cervical region: Secondary | ICD-10-CM | POA: Diagnosis present

## 2021-04-28 DIAGNOSIS — E669 Obesity, unspecified: Secondary | ICD-10-CM | POA: Diagnosis present

## 2021-04-28 DIAGNOSIS — Z6841 Body Mass Index (BMI) 40.0 and over, adult: Secondary | ICD-10-CM

## 2021-04-28 DIAGNOSIS — I639 Cerebral infarction, unspecified: Secondary | ICD-10-CM | POA: Diagnosis present

## 2021-04-28 LAB — HEPATIC FUNCTION PANEL
ALT: 20 U/L (ref 0–44)
AST: 24 U/L (ref 15–41)
Albumin: 3.8 g/dL (ref 3.5–5.0)
Alkaline Phosphatase: 60 U/L (ref 38–126)
Bilirubin, Direct: 0.1 mg/dL (ref 0.0–0.2)
Indirect Bilirubin: 0.8 mg/dL (ref 0.3–0.9)
Total Bilirubin: 0.9 mg/dL (ref 0.3–1.2)
Total Protein: 7.4 g/dL (ref 6.5–8.1)

## 2021-04-28 LAB — BASIC METABOLIC PANEL
Anion gap: 11 (ref 5–15)
BUN: 8 mg/dL (ref 6–20)
CO2: 23 mmol/L (ref 22–32)
Calcium: 9 mg/dL (ref 8.9–10.3)
Chloride: 100 mmol/L (ref 98–111)
Creatinine, Ser: 0.84 mg/dL (ref 0.61–1.24)
GFR, Estimated: >60 mL/min (ref >60)
Glucose, Bld: 342 mg/dL — ABNORMAL HIGH (ref 70–99)
Potassium: 3.7 mmol/L (ref 3.5–5.1)
Sodium: 134 mmol/L — ABNORMAL LOW (ref 135–145)

## 2021-04-28 LAB — CBC
HCT: 41.1 % (ref 39.0–52.0)
Hemoglobin: 14.5 g/dL (ref 13.0–17.0)
MCH: 30.6 pg (ref 26.0–34.0)
MCHC: 35.3 g/dL (ref 30.0–36.0)
MCV: 86.7 fL (ref 80.0–100.0)
Platelets: 259 10*3/uL (ref 150–400)
RBC: 4.74 MIL/uL (ref 4.22–5.81)
RDW: 12.1 % (ref 11.5–15.5)
WBC: 7.4 10*3/uL (ref 4.0–10.5)
nRBC: 0 % (ref 0.0–0.2)

## 2021-04-28 LAB — TROPONIN I (HIGH SENSITIVITY)
Troponin I (High Sensitivity): 5 ng/L (ref <18)
Troponin I (High Sensitivity): 6 ng/L (ref <18)

## 2021-04-28 LAB — RESP PANEL BY RT-PCR (FLU A&B, COVID) ARPGX2
Influenza A by PCR: NEGATIVE
Influenza B by PCR: NEGATIVE
SARS Coronavirus 2 by RT PCR: NEGATIVE

## 2021-04-28 LAB — APTT: aPTT: 28 seconds (ref 24–36)

## 2021-04-28 LAB — PROTIME-INR
INR: 1 (ref 0.8–1.2)
Prothrombin Time: 13.6 seconds (ref 11.4–15.2)

## 2021-04-28 LAB — ETHANOL: Alcohol, Ethyl (B): <10 mg/dL (ref <10)

## 2021-04-28 MED ORDER — CLOPIDOGREL BISULFATE 75 MG PO TABS
75.0000 mg | ORAL_TABLET | Freq: Once | ORAL | Status: AC
  Administered 2021-04-28: 75 mg via ORAL
  Filled 2021-04-28: qty 1

## 2021-04-28 MED ORDER — IOHEXOL 350 MG/ML SOLN
100.0000 mL | Freq: Once | INTRAVENOUS | Status: AC | PRN
  Administered 2021-04-28: 100 mL via INTRAVENOUS

## 2021-04-28 NOTE — ED Triage Notes (Signed)
C/o right sided chest pain x " 36 hrs "

## 2021-04-28 NOTE — Progress Notes (Signed)
  [facility] will assume care on arrival to accepting facility. Until arrival, care as per EDP. However, [facility] available 24/7 for questions and assistance.   Nursing staff please page [facility] Admits and Consults ([phone]) as soon as the patient arrives to the hospital.  Lanyla Costello, DO [facility]  

## 2021-04-28 NOTE — ED Provider Notes (Signed)
[place] EMERGENCY DEPARTMENT Provider Note   CSN: [id number] Arrival date & time: 04/28/21  1512     History  Chief Complaint  Patient presents with   Chest Pain    right    Maze Corniel is a 46 y.o. male with history of CVA in 2020 who presents today with concern for 2 days of right upper extremity weakness and intermittent paresthesias as well as right shoulder pain.  Additionally endorses some decree sensation in the left side of his face.  He denies any difficulty walking, difficulty speaking, blurry or double vision but does endorse frontal headache for the last few days.   I have personally reviewed this patient's medical records.  He has history of type 2 diabetes, hypertension, and CVA in 2020.  He is not anticoagulated but does take a baby aspirin daily.  He endorses compliance with his medications. NO hx of migraines.   HPI     Home Medications Prior to Admission medications   Medication Sig Start Date End Date Taking? Authorizing Provider  albuterol (VENTOLIN HFA) 108 (90 Base) MCG/ACT inhaler Inhale 2 puffs into the lungs every 4 (four) hours as needed for wheezing or shortness of breath. 03/12/16   [provider]  amLODipine (NORVASC) 10 MG tablet Take 1 tablet (10 mg total) by mouth every morning. Resume in 1 week 10/16/18   [name], [name], MD  ARIPiprazole (ABILIFY) 5 MG tablet Take 5 mg by mouth every morning. 08/02/18   [provider]  aspirin EC 81 MG tablet Take 81 mg by mouth every morning. 05/20/18   [provider]  atorvastatin (LIPITOR) 80 MG tablet Take 80 mg by mouth every morning. 05/20/18   [provider]  busPIRone (BUSPAR) 10 MG tablet Take 10 mg by mouth 3 (three) times daily. 07/26/18   [provider]  cyclobenzaprine (FLEXERIL) 5 MG tablet Take 5 mg by mouth 3 (three) times daily as needed for muscle spasms. 03/05/18   [provider]  DULoxetine (CYMBALTA) 30 MG capsule Take 30  mg by mouth every morning. 07/26/18   [provider]  insulin aspart (NOVOLOG FLEXPEN) 100 UNIT/ML FlexPen Inject 14 Units into the skin 3 (three) times daily between meals. 03/08/18   [provider]  insulin glargine (LANTUS) 100 UNIT/ML injection Inject 20-40 Units into the skin See admin instructions. Take 20 units in the morning, and 40 units in the evening    [provider]  lisinopril (ZESTRIL) 40 MG tablet Take 1 tablet (40 mg total) by mouth every morning. Resume in  1 week 10/10/18   [name], [name], MD  metFORMIN (GLUCOPHAGE) 1000 MG tablet Take 1,000 mg by mouth 2 (two) times a day.    [provider]  metoprolol succinate (TOPROL-XL) 50 MG 24 hr tablet Take 50 mg by mouth daily.  05/08/18   [provider]  pantoprazole (PROTONIX) 40 MG tablet Take 1 tablet (40 mg total) by mouth daily for 30 days. 10/10/18 11/09/18  [name], [name], MD  predniSONE (STERAPRED UNI-PAK 48 TAB) 10 MG (48) TBPK tablet Please use per package instruction 10/10/18   [name], [name], MD  pregabalin (LYRICA) 150 MG capsule Take 150 mg by mouth 2 (two) times a day. 09/18/18   [provider]  Semaglutide,0.25 or 0.5MG /DOS, (OZEMPIC, 0.25 OR 0.5 MG/DOSE,) 2 MG/1.5ML SOPN Inject 0.25 mg into the skin every Monday. 08/20/18   [provider]  sertraline (ZOLOFT) 100 MG tablet Take  200 mg by mouth daily. 07/26/18   [provider]  terazosin (HYTRIN) 5 MG capsule Take 5 mg by mouth at bedtime. 05/30/18   [provider]  traZODone (DESYREL) 100 MG tablet Take 100-300 mg by mouth at bedtime as needed for sleep. 07/26/18   [provider]  Vitamin D, Ergocalciferol, (DRISDOL) 1.25 MG (50000 UT) CAPS capsule Take 50,000 Units by mouth every Monday, Wednesday, and Friday.    [provider]      Allergies    Percocet [oxycodone-acetaminophen]    Review of Systems   Review of Systems  Constitutional: Negative.   HENT:  Negative.    Eyes: Negative.  Negative for visual disturbance.  Respiratory: Negative.    Cardiovascular: Negative.        Right sided chest wall pain / shoulder discomfort.   Gastrointestinal: Negative.   Musculoskeletal: Negative.   Skin: Negative.   Neurological:  Positive for facial asymmetry, weakness and headaches. Negative for dizziness, light-headedness and numbness.   Physical Exam Updated Vital Signs BP (!) 169/74 (BP Location: Left Arm)    Pulse (!) 56    Temp 98.6 F (37 C) (Oral)    Resp (!) 26    Ht 5\' 8"  (1.727 m)    Wt 131.1 kg    SpO2 100%    BMI 43.95 kg/m  Physical Exam Vitals and nursing note reviewed.  Constitutional:      Appearance: He is not ill-appearing or toxic-appearing.  HENT:     Head: Normocephalic and atraumatic.     Nose: Nose normal.     Mouth/Throat:     Mouth: Mucous membranes are moist.     Pharynx: Oropharynx is clear. Uvula midline. No oropharyngeal exudate or posterior oropharyngeal erythema.     Tonsils: No tonsillar exudate.  Eyes:     General: Lids are normal. Vision grossly intact. No visual field deficit.       Right eye: No discharge.        Left eye: No discharge.     Extraocular Movements: Extraocular movements intact.     Conjunctiva/sclera: Conjunctivae normal.     Pupils: Pupils are equal, round, and reactive to light.     Visual Fields: Right eye visual fields normal and left eye visual fields normal.  Neck:     Trachea: Trachea and phonation normal.  Cardiovascular:     Rate and Rhythm: Normal rate and regular rhythm.     Pulses: Normal pulses.     Heart sounds: Normal heart sounds. No murmur heard. Pulmonary:     Effort: Pulmonary effort is normal. No tachypnea, bradypnea, accessory muscle usage, prolonged expiration or respiratory distress.     Breath sounds: Normal breath sounds. No wheezing or rales.  Chest:     Chest wall: No mass, lacerations, deformity, swelling, tenderness, crepitus or edema.  Abdominal:      General: Bowel sounds are normal. There is no distension.     Palpations: Abdomen is soft.     Tenderness: There is no abdominal tenderness. There is no right CVA tenderness, left CVA tenderness, guarding or rebound.  Musculoskeletal:        General: No deformity.     Cervical back: Normal range of motion and neck supple. No edema, rigidity or crepitus. No pain with movement, spinous process tenderness or muscular tenderness.     Right lower leg: No tenderness. No edema.     Left lower leg: No tenderness. No edema.  Lymphadenopathy:  Cervical: No cervical adenopathy.  Skin:    General: Skin is warm and dry.     Capillary Refill: Capillary refill takes less than 2 seconds.  Neurological:     Mental Status: He is alert and oriented to person, place, and time. Mental status is at baseline.     GCS: GCS eye subscore is 4. GCS verbal subscore is 5. GCS motor subscore is 6.     Cranial Nerves: Facial asymmetry present. No dysarthria.     Sensory: Sensory deficit present.     Motor: Weakness present. No abnormal muscle tone or pronator drift.     Coordination: Romberg sign negative. Coordination abnormal. Finger-Nose-Finger Test abnormal. Heel to Shin Test normal.     Gait: Gait is intact.     Comments: Left-sided facial droop on cranial nerve exam, decreased sensation in the left half of the face Difficulty with finger-to-nose exercise with the right hand.  Fine motor coordination also impaired in the right hand relative to the left.  3/5 grip strength on the right relative to 5/5 on the left.  Symmetric lower extremity strength and sensation.  Diminished sensation to fine touch on the right hand as well.  Psychiatric:        Mood and Affect: Mood normal.    ED Results / Procedures / Treatments   Labs (all labs ordered are listed, but only abnormal results are displayed) Labs Reviewed  BASIC METABOLIC PANEL - Abnormal; Notable for the following components:      Result Value   Sodium 134  (*)    Glucose, Bld 342 (*)    All other components within normal limits  URINALYSIS, ROUTINE W REFLEX MICROSCOPIC - Abnormal; Notable for the following components:   Glucose, UA >=500 (*)    All other components within normal limits  URINALYSIS, MICROSCOPIC (REFLEX) - Abnormal; Notable for the following components:   Bacteria, UA RARE (*)    All other components within normal limits  RESP PANEL BY RT-PCR (FLU A&B, COVID) ARPGX2  CBC  ETHANOL  PROTIME-INR  APTT  RAPID URINE DRUG SCREEN, HOSP PERFORMED  HEPATIC FUNCTION PANEL  TROPONIN I (HIGH SENSITIVITY)  TROPONIN I (HIGH SENSITIVITY)    EKG None  Radiology CT ANGIO HEAD NECK W WO CM  Result Date: 04/28/2021 CLINICAL DATA:  Follow-up examination for stroke. EXAM: CT ANGIOGRAPHY HEAD AND NECK TECHNIQUE: Multidetector CT imaging of the head and neck was performed using the standard protocol during bolus administration of intravenous contrast. Multiplanar CT image reconstructions and MIPs were obtained to evaluate the vascular anatomy. Carotid stenosis measurements (when applicable) are obtained utilizing NASCET criteria, using the distal internal carotid diameter as the denominator. CONTRAST:  143mL OMNIPAQUE IOHEXOL 350 MG/ML SOLN COMPARISON:  CT from earlier the same day. FINDINGS: CTA NECK FINDINGS Aortic arch: Visualized aortic arch normal caliber with normal 3 vessel morphology. No stenosis about the origin the great vessels. Right carotid system: Right common and internal carotid arteries widely patent without stenosis, dissection or occlusion. Mild atheromatous change about the right carotid bulb without stenosis. Left carotid system: Left common and internal carotid arteries widely patent without stenosis, dissection or occlusion. Mild atheromatous change about the left carotid bulb without significant stenosis. Vertebral arteries: Both vertebral arteries arise from the subclavian arteries. No proximal subclavian artery stenosis. Both  vertebral arteries widely patent without stenosis, dissection or occlusion. Skeleton: No discrete or worrisome osseous lesions. Corticated osseous density at the tip of the dens chronic and stable from prior.  Other neck: No other acute soft tissue abnormality within the neck. Upper chest: Visualized upper chest demonstrates no acute finding. Review of the MIP images confirms the above findings CTA HEAD FINDINGS Anterior circulation: Both internal carotid arteries widely patent to the termini without stenosis. A1 segments widely patent. Normal anterior communicating artery complex. Both anterior cerebral arteries widely patent to their distal aspects without stenosis. No M1 stenosis or occlusion. Normal MCA bifurcations. Distal MCA branches well perfused and symmetric. Posterior circulation: Both V4 segments patent to the vertebrobasilar junction without stenosis. Neither PICA origin well visualized. Basilar widely patent to its distal aspect without stenosis. Superior cerebellar arteries patent bilaterally. Both PCAs primarily supplied via the basilar and are well perfused to there distal aspects. Venous sinuses: Patent allowing for timing the contrast bolus. Anatomic variants: None significant.  No aneurysm. Review of the MIP images confirms the above findings IMPRESSION: Negative CTA of the head and neck. No large vessel occlusion or other acute vascular abnormality. Mild atheromatous change about the carotid bifurcations without hemodynamically significant stenosis. Electronically Signed   By: Jeannine Boga M.D.   On: 04/28/2021 22:21   DG Chest 2 View  Result Date: 04/28/2021 CLINICAL DATA:  Chest pain. EXAM: CHEST - 2 VIEW COMPARISON:  July 30, 2020. FINDINGS: The heart size and mediastinal contours are within normal limits. Both lungs are clear. The visualized skeletal structures are unremarkable. IMPRESSION: No active cardiopulmonary disease. Electronically Signed   By: Marijo Conception M.D.   On:  04/28/2021 15:51   CT HEAD WO CONTRAST  Result Date: 04/28/2021 CLINICAL DATA:  History of chest pain, right upper extremity weakness for 2 days EXAM: CT HEAD WITHOUT CONTRAST TECHNIQUE: Contiguous axial images were obtained from the base of the skull through the vertex without intravenous contrast. COMPARISON:  10/20/2016 FINDINGS: Brain: No acute infarct or hemorrhage. Lateral ventricles and midline structures are unremarkable. No acute extra-axial fluid collections. No mass effect. Vascular: No hyperdense vessel or unexpected calcification. Skull: Normal. Negative for fracture or focal lesion. Sinuses/Orbits: No acute finding. Other: None. IMPRESSION: 1. No acute intracranial process. Electronically Signed   By: Randa Ngo M.D.   On: 04/28/2021 20:29    Procedures Procedures    Medications Ordered in ED Medications  clopidogrel (PLAVIX) tablet 75 mg (75 mg Oral Given 04/28/21 2136)  iohexol (OMNIPAQUE) 350 MG/ML injection 100 mL (100 mLs Intravenous Contrast Given 04/28/21 2120)    ED Course/ Medical Decision Making/ A&P Clinical Course as of 04/29/21 0101  Wed Apr 28, 2021  2107 Consult to neuro hospitalist Dr. Milagros Reap, who agrees with plan for admission for stroke work-up.  He is requesting medical admission at [facility].  Requesting dose of Plavix and CT angiogram of the head and neck while awaiting transfer to [facility].  I appreciate his collaboration in the care of this pleasant patient. [facility] to contact hospitalist.  [RS]  2116 Consult to Dr. Bridgett Larsson, hospitalist who is agreeable to excepting this patient to his service at [facility] for completion of stroke work-up per neurology's recommendation.  I appreciate his collaboration in the care of this patient. [RS]    Clinical Course User Index [RS] Macrina Lehnert, Gypsy Balsam, PA-C                           Medical Decision Making This patient presents to the ED for concern of right upper extremity weakness and  left-sided facial droop, this involves  an extensive number of treatment options, and is a complaint that carries with it a high risk of complications and morbidity.  The differential diagnosis includes CVA, complex migraine, residual effect from his prior stroke.  Comorbidities that complicate the patient evaluation: History of CVA Type 2 diabetes Hypertension   Additional history obtained:  Additional history obtained from chart review External records from outside source obtained and reviewed including none   Lab Tests:  I Ordered, reviewed, and interpreted labs.  The pertinent results include: CBC without leukocytosis or anemia.  BMP and hepatic function panel with normal liver and renal function.  UDS which was negative.     Imaging Studies ordered:  I ordered imaging studies including CT of the head and CTA of the head and neck. I independently visualized and interpreted imaging which showed no acute abnormalities. I agree with the radiologist interpretation   Cardiac Monitoring:  The patient was maintained on a cardiac monitor.  I personally viewed and interpreted the cardiac monitored which showed an underlying rhythm of: Normal sinus rhythm   Medicines ordered and prescription drug management:  I ordered medication including Plavix for likely CVA Reevaluation of the patient after these medicines showed that the patient stayed the same I have reviewed the patients home medicines and have made adjustments as needed  Tests Considered: MRI of the brain/MRA of the head and neck.  No MRI capabilities at this facility.   Critical Interventions:  Neurology consultation, dual antiplatelet therapy with aspirin (patient took his dose this morning) and Plavix administered in the ED   Consultations Obtained:  I requested consultation with the neurologist and hospitalist,  and discussed lab and imaging findings as well as pertinent plan - they recommend: Admission to [facility] for completion of stroke work-up and MRI   Problem List / ED Course: Right upper extremity weakness/left facial droop: Concern for CVA, no MRI capabilities here.  Will go to [facility] for completion of stroke work-up. Hypertension: Permissive hypertension at this time given concern for possible stroke.  Patient compliant with oral medications at home.  We will continue oral management in the inpatient setting per admitting team's orders.    Reevaluation:  After the interventions noted above, I reevaluated the patient and found that they have :stayed the same  Social Determinants of Health Patient lives alone, obesity  Dispostion:  After consideration of the diagnostic results and the patients response to treatment feel that the patent would benefit from admission to the hospital for completion of stroke work-up.  Antwyne voiced understanding with medical evaluation and treatment plan thus far.  Each of his questions was answered to his expressed satisfaction.  This chart was dictated using voice recognition software, Dragon. Despite the best efforts of this provider to proofread and correct errors, errors may still occur which can change documentation meaning.    Final Clinical Impression(s) / ED Diagnoses Final diagnoses:  None    Rx / DC Orders ED Discharge Orders     None         Emeline Darling, PA-C 04/29/21 0101    Fredia Sorrow, MD 05/06/21 2340

## 2021-04-28 NOTE — Progress Notes (Signed)
Brief Neuro Note:  Case discussed with ED team over phone. Briefly, [name] is a 46 y.o. male presenting with RUE weakness and numbness + Left facial droop x 2 days. He has a prior history of stroke. He is unable to sustain a smile and has impaired finger to nose testing on the right.  CTH w/o contrast is negative.  Recs: Likely small vessel stroke in the brainstem. Unlikely for spinal cord lesion to cause facial droop. He is outside tNKASE and thrombectomy window. Recommended transfer for admission to hospitalist team with stroke workup and Aspirin and plavix x 21 days followed by Aspirin alone.  Please page neurology on arrival to [facility].  [place] Pager Number [phone]

## 2021-04-29 ENCOUNTER — Observation Stay (HOSPITAL_COMMUNITY): Payer: Medicare Other

## 2021-04-29 DIAGNOSIS — G8191 Hemiplegia, unspecified affecting right dominant side: Secondary | ICD-10-CM | POA: Diagnosis present

## 2021-04-29 DIAGNOSIS — Z20822 Contact with and (suspected) exposure to covid-19: Secondary | ICD-10-CM | POA: Diagnosis present

## 2021-04-29 DIAGNOSIS — R531 Weakness: Secondary | ICD-10-CM | POA: Diagnosis present

## 2021-04-29 DIAGNOSIS — Z7984 Long term (current) use of oral hypoglycemic drugs: Secondary | ICD-10-CM | POA: Diagnosis not present

## 2021-04-29 DIAGNOSIS — Z6841 Body Mass Index (BMI) 40.0 and over, adult: Secondary | ICD-10-CM | POA: Diagnosis not present

## 2021-04-29 DIAGNOSIS — Z79899 Other long term (current) drug therapy: Secondary | ICD-10-CM | POA: Diagnosis not present

## 2021-04-29 DIAGNOSIS — M5011 Cervical disc disorder with radiculopathy,  high cervical region: Secondary | ICD-10-CM | POA: Diagnosis present

## 2021-04-29 DIAGNOSIS — R29810 Facial weakness: Secondary | ICD-10-CM | POA: Diagnosis present

## 2021-04-29 DIAGNOSIS — Z794 Long term (current) use of insulin: Secondary | ICD-10-CM | POA: Diagnosis not present

## 2021-04-29 DIAGNOSIS — Z8249 Family history of ischemic heart disease and other diseases of the circulatory system: Secondary | ICD-10-CM | POA: Diagnosis not present

## 2021-04-29 DIAGNOSIS — I6389 Other cerebral infarction: Secondary | ICD-10-CM | POA: Diagnosis not present

## 2021-04-29 DIAGNOSIS — E1165 Type 2 diabetes mellitus with hyperglycemia: Secondary | ICD-10-CM | POA: Diagnosis present

## 2021-04-29 DIAGNOSIS — I6381 Other cerebral infarction due to occlusion or stenosis of small artery: Secondary | ICD-10-CM | POA: Diagnosis present

## 2021-04-29 DIAGNOSIS — I639 Cerebral infarction, unspecified: Secondary | ICD-10-CM

## 2021-04-29 DIAGNOSIS — Z8673 Personal history of transient ischemic attack (TIA), and cerebral infarction without residual deficits: Secondary | ICD-10-CM | POA: Diagnosis not present

## 2021-04-29 DIAGNOSIS — F444 Conversion disorder with motor symptom or deficit: Secondary | ICD-10-CM | POA: Diagnosis not present

## 2021-04-29 DIAGNOSIS — Z885 Allergy status to narcotic agent status: Secondary | ICD-10-CM | POA: Diagnosis not present

## 2021-04-29 DIAGNOSIS — E119 Type 2 diabetes mellitus without complications: Secondary | ICD-10-CM

## 2021-04-29 DIAGNOSIS — E785 Hyperlipidemia, unspecified: Secondary | ICD-10-CM

## 2021-04-29 DIAGNOSIS — I10 Essential (primary) hypertension: Secondary | ICD-10-CM | POA: Diagnosis present

## 2021-04-29 DIAGNOSIS — G4733 Obstructive sleep apnea (adult) (pediatric): Secondary | ICD-10-CM | POA: Diagnosis present

## 2021-04-29 DIAGNOSIS — Z23 Encounter for immunization: Secondary | ICD-10-CM | POA: Diagnosis present

## 2021-04-29 DIAGNOSIS — E7800 Pure hypercholesterolemia, unspecified: Secondary | ICD-10-CM | POA: Diagnosis present

## 2021-04-29 DIAGNOSIS — R29702 NIHSS score 2: Secondary | ICD-10-CM | POA: Diagnosis present

## 2021-04-29 DIAGNOSIS — Z7982 Long term (current) use of aspirin: Secondary | ICD-10-CM | POA: Diagnosis not present

## 2021-04-29 DIAGNOSIS — E669 Obesity, unspecified: Secondary | ICD-10-CM | POA: Diagnosis present

## 2021-04-29 HISTORY — DX: Cerebral infarction, unspecified: I63.9

## 2021-04-29 LAB — RAPID URINE DRUG SCREEN, HOSP PERFORMED
Amphetamines: NOT DETECTED
Barbiturates: NOT DETECTED
Benzodiazepines: NOT DETECTED
Cocaine: NOT DETECTED
Opiates: NOT DETECTED
Tetrahydrocannabinol: NOT DETECTED

## 2021-04-29 LAB — GLUCOSE, CAPILLARY
Glucose-Capillary: 274 mg/dL — ABNORMAL HIGH (ref 70–99)
Glucose-Capillary: 297 mg/dL — ABNORMAL HIGH (ref 70–99)
Glucose-Capillary: 233 mg/dL — ABNORMAL HIGH (ref 70–99)
Glucose-Capillary: 130 mg/dL — ABNORMAL HIGH (ref 70–99)
Glucose-Capillary: 304 mg/dL — ABNORMAL HIGH (ref 70–99)

## 2021-04-29 LAB — URINALYSIS, ROUTINE W REFLEX MICROSCOPIC
Bilirubin Urine: NEGATIVE
Glucose, UA: >=500 mg/dL — AB
Hgb urine dipstick: NEGATIVE
Ketones, ur: NEGATIVE mg/dL
Leukocytes,Ua: NEGATIVE
Nitrite: NEGATIVE
Protein, ur: NEGATIVE mg/dL
Specific Gravity, Urine: 1.01 (ref 1.005–1.030)
pH: 7 (ref 5.0–8.0)

## 2021-04-29 LAB — URINALYSIS, MICROSCOPIC (REFLEX)

## 2021-04-29 LAB — ECHOCARDIOGRAM COMPLETE
AR max vel: 3.55 cm2
AV Area VTI: 3.53 cm2
AV Area mean vel: 3.24 cm2
AV Mean grad: 3 mmHg
AV Peak grad: 5.5 mmHg
Ao pk vel: 1.17 m/s
Area-P 1/2: 4.49 cm2
Height: 68 in
S' Lateral: 3.6 cm
Weight: 4624.37 oz

## 2021-04-29 LAB — HEMOGLOBIN A1C
Hgb A1c MFr Bld: 10.5 % — ABNORMAL HIGH (ref 4.8–5.6)
Mean Plasma Glucose: 254.65 mg/dL

## 2021-04-29 LAB — HIV ANTIBODY (ROUTINE TESTING W REFLEX): HIV Screen 4th Generation wRfx: NONREACTIVE

## 2021-04-29 MED ORDER — INSULIN ASPART 100 UNIT/ML IJ SOLN
10.0000 [IU] | Freq: Three times a day (TID) | INTRAMUSCULAR | Status: DC
  Administered 2021-04-29 – 2021-04-30 (×5): 10 [IU] via SUBCUTANEOUS

## 2021-04-29 MED ORDER — ASPIRIN 325 MG PO TABS: 325.0000 mg | ORAL_TABLET | Freq: Every day | ORAL | Status: DC

## 2021-04-29 MED ORDER — STROKE: EARLY STAGES OF RECOVERY BOOK
Freq: Once | Status: AC
  Filled 2021-04-29: qty 1

## 2021-04-29 MED ORDER — INSULIN ASPART 100 UNIT/ML IJ SOLN
0.0000 [IU] | Freq: Three times a day (TID) | INTRAMUSCULAR | Status: DC
  Administered 2021-04-29: 3 [IU] via SUBCUTANEOUS
  Administered 2021-04-29: 7 [IU] via SUBCUTANEOUS
  Administered 2021-04-29: 11 [IU] via SUBCUTANEOUS
  Administered 2021-04-30: 15 [IU] via SUBCUTANEOUS
  Administered 2021-04-30: 7 [IU] via SUBCUTANEOUS

## 2021-04-29 MED ORDER — PNEUMOCOCCAL VAC POLYVALENT 25 MCG/0.5ML IJ INJ
0.5000 mL | INJECTION | INTRAMUSCULAR | Status: AC
  Administered 2021-04-30: 0.5 mL via INTRAMUSCULAR
  Filled 2021-04-29: qty 0.5

## 2021-04-29 MED ORDER — CLOPIDOGREL BISULFATE 75 MG PO TABS
75.0000 mg | ORAL_TABLET | Freq: Every day | ORAL | Status: DC
  Administered 2021-04-29 – 2021-04-30 (×2): 75 mg via ORAL
  Filled 2021-04-29 (×2): qty 1

## 2021-04-29 MED ORDER — ATORVASTATIN CALCIUM 80 MG PO TABS
80.0000 mg | ORAL_TABLET | Freq: Every morning | ORAL | Status: DC
  Administered 2021-04-29 – 2021-04-30 (×2): 80 mg via ORAL
  Filled 2021-04-29 (×2): qty 1

## 2021-04-29 MED ORDER — SERTRALINE HCL 50 MG PO TABS
50.0000 mg | ORAL_TABLET | Freq: Every day | ORAL | Status: DC
  Administered 2021-04-29 – 2021-04-30 (×2): 50 mg via ORAL
  Filled 2021-04-29 (×3): qty 1

## 2021-04-29 MED ORDER — ACETAMINOPHEN 325 MG PO TABS: 650.0000 mg | ORAL_TABLET | ORAL | Status: DC | PRN

## 2021-04-29 MED ORDER — INFLUENZA VAC SPLIT QUAD 0.5 ML IM SUSY: 0.5000 mL | PREFILLED_SYRINGE | INTRAMUSCULAR | Status: AC

## 2021-04-29 MED ORDER — CYCLOBENZAPRINE HCL 10 MG PO TABS
5.0000 mg | ORAL_TABLET | Freq: Three times a day (TID) | ORAL | Status: DC | PRN
  Administered 2021-04-30: 5 mg via ORAL
  Filled 2021-04-29: qty 1

## 2021-04-29 MED ORDER — ENOXAPARIN SODIUM 40 MG/0.4ML IJ SOSY
40.0000 mg | PREFILLED_SYRINGE | INTRAMUSCULAR | Status: DC
  Administered 2021-04-29 – 2021-04-30 (×2): 40 mg via SUBCUTANEOUS
  Filled 2021-04-29 (×2): qty 0.4

## 2021-04-29 MED ORDER — ACETAMINOPHEN 650 MG RE SUPP: 650.0000 mg | RECTAL | Status: DC | PRN

## 2021-04-29 MED ORDER — ASPIRIN EC 81 MG PO TBEC
81.0000 mg | DELAYED_RELEASE_TABLET | Freq: Every day | ORAL | Status: DC
  Administered 2021-04-29 – 2021-04-30 (×2): 81 mg via ORAL
  Filled 2021-04-29 (×2): qty 1

## 2021-04-29 MED ORDER — LORAZEPAM 2 MG/ML IJ SOLN
1.0000 mg | Freq: Once | INTRAMUSCULAR | Status: AC | PRN
  Administered 2021-04-29: 1 mg via INTRAVENOUS
  Filled 2021-04-29: qty 1

## 2021-04-29 MED ORDER — KETOROLAC TROMETHAMINE 30 MG/ML IJ SOLN
30.0000 mg | Freq: Four times a day (QID) | INTRAMUSCULAR | Status: DC | PRN
  Administered 2021-04-29 – 2021-04-30 (×5): 30 mg via INTRAVENOUS
  Filled 2021-04-29 (×5): qty 1

## 2021-04-29 MED ORDER — INSULIN GLARGINE-YFGN 100 UNIT/ML ~~LOC~~ SOLN
40.0000 [IU] | Freq: Every day | SUBCUTANEOUS | Status: DC
  Administered 2021-04-29: 40 [IU] via SUBCUTANEOUS
  Filled 2021-04-29 (×2): qty 0.4

## 2021-04-29 MED ORDER — ASPIRIN 300 MG RE SUPP: 300.0000 mg | Freq: Every day | RECTAL | Status: DC

## 2021-04-29 MED ORDER — TRAZODONE HCL 100 MG PO TABS: 100.0000 mg | ORAL_TABLET | Freq: Every evening | ORAL | Status: DC | PRN

## 2021-04-29 MED ORDER — INSULIN GLARGINE-YFGN 100 UNIT/ML ~~LOC~~ SOLN
20.0000 [IU] | Freq: Every day | SUBCUTANEOUS | Status: DC
  Administered 2021-04-29 – 2021-04-30 (×2): 20 [IU] via SUBCUTANEOUS
  Filled 2021-04-29 (×2): qty 0.2

## 2021-04-29 MED ORDER — TERAZOSIN HCL 5 MG PO CAPS
5.0000 mg | ORAL_CAPSULE | Freq: Every day | ORAL | Status: DC
  Administered 2021-04-29: 5 mg via ORAL
  Filled 2021-04-29 (×2): qty 1

## 2021-04-29 MED ORDER — ACETAMINOPHEN 160 MG/5ML PO SOLN: 650.0000 mg | ORAL | Status: DC | PRN

## 2021-04-29 NOTE — H&P (Addendum)
History and Physical    Christopher Roberson NLZ:[id number] DOB: 20-Feb-1976 DOA: 04/28/2021  PCP: [name], FNP  Patient coming from: Home  I have personally briefly reviewed patient's old medical records in [place]  Chief Complaint: L facial droop, R arm weakness and pain  HPI: Christopher Roberson is a 46 y.o. male with medical history significant of DM2, HTN, OSA.  Pt with h/o R arm pain due to radiculopathy in past it appears based on notes.  Also carpal tunnel.    Pt seen in ED at OSH in Jan 2020 with R arm numbness and L facial droop.  MRI interestingly showed a small 75mm acute cerebellar stroke not believed to be the cause of either of these symptoms.  Ultimately diagnosed with Bell's Palsy AND stroke.  Subsequent MRIs would just show a chronic cerebellar stroke in that location.  Pt presents to ED today with c/o 2 day h/o L sided facial droop, R shoulder pain and R arm weakness.  Facial droop does include his L eyebrow and L eye.  Is similar to presentation in 2020 when he had Bells palsy with cerebellar stroke.  Symptoms constant, persistent, unchanging.  Nothing makes better or worse.  No fever, chills, cough.   ED Course: CT head neg.  CTA head and neck also neg.  Pt transferred to [facility] due to concern for stroke.   Review of Systems: As per HPI, otherwise all review of systems negative.  Past Medical History:  Diagnosis Date   Diabetes mellitus    Hypertension    Stroke [facility])     Past Surgical History:  Procedure Laterality Date   APPENDECTOMY     CARPAL TUNNEL RELEASE     HERNIA REPAIR       reports that he has never smoked. He has never used smokeless tobacco. He reports that he does not drink alcohol and does not use drugs.  Allergies  Allergen Reactions   Percocet [Oxycodone-Acetaminophen] Itching    Family History  Problem Relation Age of Onset   Heart disease Mother      Prior to Admission medications   Medication Sig Start Date End  Date Taking? Authorizing Provider  albuterol (VENTOLIN HFA) 108 (90 Base) MCG/ACT inhaler Inhale 2 puffs into the lungs every 4 (four) hours as needed for wheezing or shortness of breath. 03/12/16   [provider]  amLODipine (NORVASC) 10 MG tablet Take 1 tablet (10 mg total) by mouth every morning. Resume in 1 week 10/16/18   [name], [name], MD  ARIPiprazole (ABILIFY) 5 MG tablet Take 5 mg by mouth every morning. 08/02/18   [provider]  aspirin EC 81 MG tablet Take 81 mg by mouth every morning. 05/20/18   [provider]  atorvastatin (LIPITOR) 80 MG tablet Take 80 mg by mouth every morning. 05/20/18   [provider]  busPIRone (BUSPAR) 10 MG tablet Take 10 mg by mouth 3 (three) times daily. 07/26/18   [provider]  cyclobenzaprine (FLEXERIL) 5 MG tablet Take 5 mg by mouth 3 (three) times daily as needed for muscle spasms. 03/05/18   [provider]  DULoxetine (CYMBALTA) 30 MG capsule Take 30 mg by mouth every morning. 07/26/18   [provider]  insulin aspart (NOVOLOG FLEXPEN) 100 UNIT/ML FlexPen Inject 14 Units into the skin 3 (three) times daily between meals. 03/08/18   [provider]  insulin glargine (LANTUS) 100 UNIT/ML injection Inject 20-40 Units into the skin See  admin instructions. Take 20 units in the morning, and 40 units in the evening    [provider]  lisinopril (ZESTRIL) 40 MG tablet Take 1 tablet (40 mg total) by mouth every morning. Resume in  1 week 10/10/18   [name], [name], MD  metFORMIN (GLUCOPHAGE) 1000 MG tablet Take 1,000 mg by mouth 2 (two) times a day.    [provider]  metoprolol succinate (TOPROL-XL) 50 MG 24 hr tablet Take 50 mg by mouth daily.  05/08/18   [provider]  pantoprazole (PROTONIX) 40 MG tablet Take 1 tablet (40 mg total) by mouth daily for 30 days. 10/10/18 11/09/18  [name], [name], MD  predniSONE (STERAPRED UNI-PAK 48 TAB) 10 MG (48) TBPK  tablet Please use per package instruction 10/10/18   [name], [name], MD  pregabalin (LYRICA) 150 MG capsule Take 150 mg by mouth 2 (two) times a day. 09/18/18   [provider]  Semaglutide,0.25 or 0.5MG /DOS, (OZEMPIC, 0.25 OR 0.5 MG/DOSE,) 2 MG/1.5ML SOPN Inject 0.25 mg into the skin every Monday. 08/20/18   [provider]  sertraline (ZOLOFT) 100 MG tablet Take 200 mg by mouth daily. 07/26/18   [provider]  terazosin (HYTRIN) 5 MG capsule Take 5 mg by mouth at bedtime. 05/30/18   [provider]  traZODone (DESYREL) 100 MG tablet Take 100-300 mg by mouth at bedtime as needed for sleep. 07/26/18   [provider]  Vitamin D, Ergocalciferol, (DRISDOL) 1.25 MG (50000 UT) CAPS capsule Take 50,000 Units by mouth every Monday, Wednesday, and Friday.    [provider]    Physical Exam: Vitals:   04/29/21 0047 04/29/21 0208 04/29/21 0411 04/29/21 0529  BP: (!) 169/74 (!) 163/73 (!) 171/76 (!) 158/88  Pulse: (!) 56 (!) 52 61 (!) 59  Resp: (!) 26 (!) 24 (!) 22 (!) 22  Temp: 98.6 F (37 C) 98 F (36.7 C) 97.9 F (36.6 C) 98.1 F (36.7 C)  TempSrc: Oral Oral Oral Oral  SpO2: 100% 100% 99% 98%  Weight: 131.1 kg     Height: 5\' 8"  (1.727 m)       Constitutional: NAD, calm, comfortable Eyes: PERRL, lids and conjunctivae normal ENMT: Mucous membranes are moist. Posterior pharynx clear of any exudate or lesions.Normal dentition.  Neck: normal, supple, no masses, no thyromegaly Respiratory: clear to auscultation bilaterally, no wheezing, no crackles. Normal respiratory effort. No accessory muscle use.  Cardiovascular: Regular rate and rhythm, no murmurs / rubs / gallops. No extremity edema. 2+ pedal pulses. No carotid bruits.  Abdomen: no tenderness, no masses palpated. No hepatosplenomegaly. Bowel sounds positive.  Musculoskeletal: no clubbing / cyanosis. No joint deformity upper and lower extremities. Good ROM, no contractures. Normal muscle  tone.  Skin: no rashes, lesions, ulcers. No induration Neurologic: RUE 4/5 strength.  L facial droop in more of a Bell's palsy like pattern including weakness of eyebrow and L eye muscles. Psychiatric: Normal judgment and insight. Alert and oriented x 3. Normal mood.    Labs on Admission: I have personally reviewed following labs and imaging studies  CBC: Recent Labs  Lab 04/28/21 1529  WBC 7.4  HGB 14.5  HCT 41.1  MCV 86.7  PLT 741   Basic Metabolic Panel: Recent Labs  Lab 04/28/21 1529  NA 134*  K 3.7  CL 100  CO2 23  GLUCOSE 342*  BUN 8  CREATININE 0.84  CALCIUM 9.0   GFR: Estimated Creatinine Clearance: 145.3 mL/min (by C-G formula  based on SCr of 0.84 mg/dL). Liver Function Tests: Recent Labs  Lab 04/28/21 2033  AST 24  ALT 20  ALKPHOS 60  BILITOT 0.9  PROT 7.4  ALBUMIN 3.8   No results for input(s): LIPASE, AMYLASE in the last 168 hours. No results for input(s): AMMONIA in the last 168 hours. Coagulation Profile: Recent Labs  Lab 04/28/21 2033  INR 1.0   Cardiac Enzymes: No results for input(s): CKTOTAL, CKMB, CKMBINDEX, TROPONINI in the last 168 hours. BNP (last 3 results) No results for input(s): PROBNP in the last 8760 hours. HbA1C: Recent Labs    04/29/21 0537  HGBA1C 10.5*   CBG: Recent Labs  Lab 04/29/21 0138  GLUCAP 274*   Lipid Profile: No results for input(s): CHOL, HDL, LDLCALC, TRIG, CHOLHDL, LDLDIRECT in the last 72 hours. Thyroid Function Tests: No results for input(s): TSH, T4TOTAL, FREET4, T3FREE, THYROIDAB in the last 72 hours. Anemia Panel: No results for input(s): VITAMINB12, FOLATE, FERRITIN, TIBC, IRON, RETICCTPCT in the last 72 hours. Urine analysis:    Component Value Date/Time   COLORURINE YELLOW 04/29/2021 0005   APPEARANCEUR CLEAR 04/29/2021 0005   LABSPEC 1.010 04/29/2021 0005   PHURINE 7.0 04/29/2021 0005   GLUCOSEU >=500 (A) 04/29/2021 0005   HGBUR NEGATIVE 04/29/2021 0005   BILIRUBINUR NEGATIVE  04/29/2021 0005   KETONESUR NEGATIVE 04/29/2021 0005   PROTEINUR NEGATIVE 04/29/2021 0005   UROBILINOGEN 1.0 01/05/2012 1232   NITRITE NEGATIVE 04/29/2021 0005   LEUKOCYTESUR NEGATIVE 04/29/2021 0005    Radiological Exams on Admission: CT ANGIO HEAD NECK W WO CM  Result Date: 04/28/2021 CLINICAL DATA:  Follow-up examination for stroke. EXAM: CT ANGIOGRAPHY HEAD AND NECK TECHNIQUE: Multidetector CT imaging of the head and neck was performed using the standard protocol during bolus administration of intravenous contrast. Multiplanar CT image reconstructions and MIPs were obtained to evaluate the vascular anatomy. Carotid stenosis measurements (when applicable) are obtained utilizing NASCET criteria, using the distal internal carotid diameter as the denominator. CONTRAST:  131mL OMNIPAQUE IOHEXOL 350 MG/ML SOLN COMPARISON:  CT from earlier the same day. FINDINGS: CTA NECK FINDINGS Aortic arch: Visualized aortic arch normal caliber with normal 3 vessel morphology. No stenosis about the origin the great vessels. Right carotid system: Right common and internal carotid arteries widely patent without stenosis, dissection or occlusion. Mild atheromatous change about the right carotid bulb without stenosis. Left carotid system: Left common and internal carotid arteries widely patent without stenosis, dissection or occlusion. Mild atheromatous change about the left carotid bulb without significant stenosis. Vertebral arteries: Both vertebral arteries arise from the subclavian arteries. No proximal subclavian artery stenosis. Both vertebral arteries widely patent without stenosis, dissection or occlusion. Skeleton: No discrete or worrisome osseous lesions. Corticated osseous density at the tip of the dens chronic and stable from prior. Other neck: No other acute soft tissue abnormality within the neck. Upper chest: Visualized upper chest demonstrates no acute finding. Review of the MIP images confirms the above  findings CTA HEAD FINDINGS Anterior circulation: Both internal carotid arteries widely patent to the termini without stenosis. A1 segments widely patent. Normal anterior communicating artery complex. Both anterior cerebral arteries widely patent to their distal aspects without stenosis. No M1 stenosis or occlusion. Normal MCA bifurcations. Distal MCA branches well perfused and symmetric. Posterior circulation: Both V4 segments patent to the vertebrobasilar junction without stenosis. Neither PICA origin well visualized. Basilar widely patent to its distal aspect without stenosis. Superior cerebellar arteries patent bilaterally. Both PCAs primarily supplied via the basilar and  are well perfused to there distal aspects. Venous sinuses: Patent allowing for timing the contrast bolus. Anatomic variants: None significant.  No aneurysm. Review of the MIP images confirms the above findings IMPRESSION: Negative CTA of the head and neck. No large vessel occlusion or other acute vascular abnormality. Mild atheromatous change about the carotid bifurcations without hemodynamically significant stenosis. Electronically Signed   By: Jeannine Boga M.D.   On: 04/28/2021 22:21   DG Chest 2 View  Result Date: 04/28/2021 CLINICAL DATA:  Chest pain. EXAM: CHEST - 2 VIEW COMPARISON:  July 30, 2020. FINDINGS: The heart size and mediastinal contours are within normal limits. Both lungs are clear. The visualized skeletal structures are unremarkable. IMPRESSION: No active cardiopulmonary disease. Electronically Signed   By: Marijo Conception M.D.   On: 04/28/2021 15:51   CT HEAD WO CONTRAST  Result Date: 04/28/2021 CLINICAL DATA:  History of chest pain, right upper extremity weakness for 2 days EXAM: CT HEAD WITHOUT CONTRAST TECHNIQUE: Contiguous axial images were obtained from the base of the skull through the vertex without intravenous contrast. COMPARISON:  10/20/2016 FINDINGS: Brain: No acute infarct or hemorrhage. Lateral  ventricles and midline structures are unremarkable. No acute extra-axial fluid collections. No mass effect. Vascular: No hyperdense vessel or unexpected calcification. Skull: Normal. Negative for fracture or focal lesion. Sinuses/Orbits: No acute finding. Other: None. IMPRESSION: 1. No acute intracranial process. Electronically Signed   By: Randa Ngo M.D.   On: 04/28/2021 20:29    EKG: Independently reviewed.  Assessment/Plan Principal Problem:   Stroke [facility]) Active Problems:   Type 2 diabetes mellitus (HCC)   Essential hypertension   HLD (hyperlipidemia)    Question of stroke - DDx includes brainstem infarct causing L facial droop and R arm weakness vs just recurrent Bell's Palsy causing facial droop, known radiculopathy causing the R arm pain and weakness. MRI should be able to differentiate stroke vs no-stroke here. Stroke pathway and work up ordered in the mean time. Will allow permissive HTN until we know one way or the other. ASA + plavix for 21 days Neuro consulting Neuro thinks findings are functional DM2 - Cont home Lantus: 20u AM and 40u QHS Takes novolog 14u TID AC Will put on novolog 10u TID AC while here And add resistant scale SSI AC R shoulder pain - PRN toradol for now HTN - Holding home BP meds in setting of questionable acute stroke HLD - Cont statin  DVT prophylaxis: Lovenox Code Status: Full Family Communication: No family in room Disposition Plan: Home after stroke work up [employer] called: Dr. Lorrin Goodell Admission status: Place in [place], [place] Hospitalists  How to contact the [facility] Attending or Consulting provider [place] or covering provider during after hours [place], for this patient?  Check the care team in [facility] and look for a) attending/consulting [facility] provider listed and b) the [facility] team listed Log into [email or web address]  Amion Physician Scheduling and messaging for groups and whole hospitals  On call and physician scheduling  software for group practices, residents, hospitalists and other medical providers for call, clinic, rotation and shift schedules. OnCall [employer] is a hospital-wide system for scheduling doctors and paging doctors on call. EasyPlot is for scientific plotting and data analysis.  [email or web address]  and use [place]'s universal password to access. If you do not have the password, please contact the hospital operator.  Locate the [facility] provider you are looking for under [facility] and page  to a number that you can be directly reached. If you still have difficulty reaching the provider, please page the [facility] (Director on Call) for the Hospitalists listed on amion for assistance.  04/29/2021, 6:05 AM

## 2021-04-29 NOTE — Evaluation (Signed)
Physical Therapy Evaluation Patient Details Name: Christopher Roberson MRN: [id number] DOB: [birth year] Today's Date: 04/29/2021  History of Present Illness  46 y.o. male  presents 04/28/21 with  2 days history of RUE weakness and numbness and Left facial droop. 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 radiata. 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  Clinical Impression  PTA pt was living with wife and 6 boys in multistory home with bed and bath downstairs and level entry. Pt utilizes a RW for limited community ambulation and is independent in bADLs, wife and family provide for iADLs. Pt has history of R shoulder pain and numbness and tingling in R hand and fore arm, however pt reports that is worse since his episode earlier this week, pt has decreased strength on R side greatest in hip flexors and dorsiflexors which he states is new for him. Pt is mod I for bed mobility, and min A for transfers. He requires modA for steadying after R knee buckling and loss of balance. PT recommending Outpatient PT to address above deficits. PT will continue to follow acutely.         Recommendations for follow up therapy are one component of a multi-disciplinary discharge planning process, led by the attending physician.  Recommendations may be updated based on patient status, additional functional criteria and insurance authorization.  Follow Up Recommendations Outpatient PT    Assistance Recommended at Discharge Intermittent Supervision/Assistance  Patient can return home with the following  Assistance with cooking/housework;A little help with walking and/or transfers    Equipment Recommendations None recommended by PT  Recommendations for Other Services       Functional Status Assessment Patient has had a recent decline in their functional  status and demonstrates the ability to make significant improvements in function in a reasonable and predictable amount of time.     Precautions / Restrictions Precautions Precautions: Fall Precaution Comments: none within last 6 months Restrictions Weight Bearing Restrictions: No      Mobility  Bed Mobility Overal bed mobility: Modified Independent             General bed mobility comments: HoB slightly elevated and use of bedrail to come to EoB    Transfers Overall transfer level: Needs assistance Equipment used: None;Rolling walker (2 wheels) Transfers: Sit to/from Stand Sit to Stand: Min assist           General transfer comment: attempt without AD and pt with increased difficulty with self steadying, min guard for transfer with RW, able to maintain balance with UE support    Ambulation/Gait Ambulation/Gait assistance: Mod assist;Min assist Gait Distance (Feet): 25 Feet Assistive device: Rolling walker (2 wheels) Gait Pattern/deviations: Step-through pattern;Decreased step length - right;Decreased step length - left;Decreased stance time - right;Decreased dorsiflexion - right;Knees buckling;Trunk flexed;Antalgic Gait velocity: slowed Gait velocity interpretation: <1.31 ft/sec, indicative of household ambulator Pre-gait activities: weightshifting General Gait Details: initially min A for steadying, one bout of R knee buckling requiring modA for stabilization. slowed, ambulation with decreased foot clerance R>L during swing         Balance Overall balance assessment: Needs assistance Sitting-balance support: Feet supported Sitting balance-Leahy Scale: Fair     Standing balance support: Bilateral upper extremity supported;Single extremity supported Standing balance-Leahy Scale: Poor Standing balance comment: requires at least single UE support  Pertinent Vitals/Pain Pain Assessment: 0-10 Pain Score: 7  Pain  Location: R shoulder, R hip Adductor spasm Pain Descriptors / Indicators: Sharp;Shooting;Pins and needles;Tingling;Sore;Spasm Pain Intervention(s): Limited activity within patient's tolerance;Monitored during session;Repositioned    Home Living Family/patient expects to be discharged to:: Private residence Living Arrangements: Spouse/significant other Available Help at Discharge: Family;Available PRN/intermittently Type of Home: House Home Access: Level entry     Alternate Level Stairs-Number of Steps: 15 Home Layout: Multi-level;Able to live on main level with bedroom/bathroom Home Equipment: Rolling Walker (2 wheels);Cane - single point      Prior Function Prior Level of Function : Needs assist  Cognitive Assist : ADLs (cognitive)   ADLs (Cognitive): Set up cues Physical Assist : Mobility (physical)     Mobility Comments: ambulates limited community distances with RW. ADLs Comments: independent     Extremity/Trunk Assessment   Upper Extremity Assessment Upper Extremity Assessment: Defer to OT evaluation    Lower Extremity Assessment Lower Extremity Assessment: RLE deficits/detail RLE Deficits / Details: ROM WFL, strength hip 3/5, knee flex/ext 4/5, plantarflex 4/5, dorsiflex 3+/5, pt utilizes back muscles to compensate for hip flexor strength, MMT of only hip flexors elicits cramping, pt reports cramping in LE muscles frequently RLE Sensation: decreased light touch RLE Coordination: decreased fine motor (requires assist to get R foot slid in [place])    Cervical / Trunk Assessment Cervical / Trunk Assessment: Other exceptions Cervical / Trunk Exceptions: R shoulder hike  Communication      Cognition Arousal/Alertness: Lethargic;Suspect due to medications (history of narcolepsy and medicated for MRI) Behavior During Therapy: Flat affect Overall Cognitive Status: Impaired/Different from baseline Area of Impairment: Following commands;Problem solving                        Following Commands: Follows one step commands with increased time;Follows multi-step commands with increased time     Problem Solving: Slow processing;Decreased initiation;Requires verbal cues;Requires tactile cues          General Comments General comments (skin integrity, edema, etc.): VSS on RA,        Assessment/Plan    PT Assessment Patient needs continued PT services  PT Problem List Decreased strength;Decreased activity tolerance;Decreased balance;Decreased mobility;Impaired sensation;Pain       PT Treatment Interventions DME instruction;Gait training;Functional mobility training;Therapeutic activities;Therapeutic exercise;Balance training;Cognitive remediation;Patient/family education    PT Goals (Current goals can be found in the Care Plan section)  Acute Rehab PT Goals Patient Stated Goal: go home to his 6 boys PT Goal Formulation: With patient Time For Goal Achievement: 05/13/21 Potential to Achieve Goals: Fair    Frequency Min 3X/week        AM-PAC PT "6 Clicks" Mobility  Outcome Measure Help needed turning from your back to your side while in a flat bed without using bedrails?: None Help needed moving from lying on your back to sitting on the side of a flat bed without using bedrails?: None Help needed moving to and from a bed to a chair (including a wheelchair)?: None Help needed standing up from a chair using your arms (e.g., wheelchair or bedside chair)?: None Help needed to walk in hospital room?: A Little Help needed climbing 3-5 steps with a railing? : A Lot 6 Click Score: 21    End of Session Equipment Utilized During Treatment: Gait belt Activity Tolerance: Patient limited by lethargy Patient left: in chair;Other (comment) (OT present) Nurse Communication: Mobility status PT Visit Diagnosis: Difficulty in walking, not elsewhere  classified (R26.2);Muscle weakness (generalized) (M62.81)    Time: 4932-4199 PT Time Calculation (min)  (ACUTE ONLY): 23 min   Charges:   PT Evaluation $PT Eval Moderate Complexity: 1 Mod PT Treatments $Therapeutic Activity: 8-22 mins        Renne Platts B. Migdalia Dk PT, DPT Acute Rehabilitation Services Pager [phone] Office [phone]   [place] 04/29/2021, 12:16 PM

## 2021-04-29 NOTE — Progress Notes (Signed)
°   04/29/21 1450  Clinical Encounter Type  Visited With Patient  Visit Type Initial;Psychological support;Spiritual support  Referral From Nurse  Consult/Referral To Nurse   Chaplain Jorene Guest responded to the consult request for an Advance Directive. Chaplain provided A.D. education. The patient said he wants his wife to be his HCPOA. The chaplain advised the patient since he is legally married, his wife, will be designated by Sports coach to speak on his  behalf if he becomes incapacitated. Chaplain Owens Shark advised HCPOA document can be done if he chooses someone other than his wife by completing an A.D. Patient explored his feelings about stress which led to him being admitted. Ike Bene asked guided questions to explore his faith and goals. The patient stated he understands he needs to make life changing decision. The patient expressed appreciation of the chaplain's visit. Chaplain ended visit with prayer. This note was prepared by Jeanine Luz, M.Div..  For questions please contact by phone [phone].

## 2021-04-29 NOTE — Progress Notes (Addendum)
Inpatient Diabetes Program Recommendations  AACE/ADA: New Consensus Statement on Inpatient Glycemic Control   Target Ranges:  Prepandial:   less than 140 mg/dL      Peak postprandial:   less than 180 mg/dL (1-2 hours)      Critically ill patients:  140 - 180 mg/dL    Latest Reference Range & Units 04/29/21 01:38 04/29/21 06:21  Glucose-Capillary 70 - 99 mg/dL 274 (H) 297 (H)    Latest Reference Range & Units 04/29/21 05:37  Hemoglobin A1C 4.8 - 5.6 % 10.5 (H)   Review of Glycemic Control  Diabetes history: DM2 Outpatient Diabetes medications: Lantus 20 units QAM, Lantus 40 units QPM, Metformin 1000 mg BID, Farxiga 5 mg daily, Ozempic 0.50 mg Qweek Current orders for Inpatient glycemic control: Semglee 20 units QAM, Semglee 40 units QHS, Novolog 0-20 units TID with meals, Novolog 10 units TID with meals  Inpatient Diabetes Program Recommendations:    HbgA1C:  A1C to 10.5% on 04/29/21 indicating an average glucose of 255 mg/dl over the past 2-3 months.  Outpatient DM needs: Patient t states that he is almost out of Metformin, Lantus pens, and insulin pen needles. He also reports that he moved from [facility] to [place] and has lost his glucometer and testing supplies. At time of discharge please provide Rx for: glucose monitoring kit (#[id number]), Metformin, Lantus SoloStar pens (#[id number]), insulin pen needles [phone]). Patient feels he needs to start taking Novolog meal coverage again since glucose is consistently in the 200-300's mg/dl after eating. May want to consider providing Rx for Novolog Flexpens [phone]) for meal coverage if appropriate.  NOTE: Per chart, patient sees Dr. Tamala Julian [facility]) and was last seen on 10/15/20. Per office note on 10/15/20, patient is prescribed Lantus 20 units QAM, Lantus 40 units QPM, Novolog 14 units with meals, Metformin 1000 mg BID, Farxiga 5 mg daily, Ozempic 0.5 mg Qweek for DM control and last A1C was 8.1% on 10/15/20.  Addendum  04/29/21_0 :30-Spoke with patient about diabetes and home regimen for diabetes control. Patient reports being followed by Dr. Tamala Julian (Endocrinologist) for diabetes management and he has an appointment at the end of this month. Patient reports that he has been busy and distracted with other things/people over the past couple of months and he has not been good about taking care of himself. Patient states that he is prescribed Lantus 20 units QAM, Lantus 40 units QPM, Metformin 1000 mg BID, Farxiga 5 mg daily, and Ozempic 0.50 mg Qweek (Monday). Patient also notes that he use to be on Novolog 14 units with meals which was stopped when he was started on Ozempic (over 2 years ago). Patient states that he feels that he needs to get back on Novolog meal coverage due to higher glucose levels after eating.  Patient reports that his fasting is in the 100's and that his glucose goes up in 200-300's mg/dl during the day. Discussed A1C results (10.5% on 04/29/21) and explained that current A1C indicates an average glucose of  255 mg/dl over the past 2-3 months. Discussed glucose and A1C goals. Discussed importance of checking CBGs and maintaining good CBG control to prevent long-term and short-term complications. Explained how hyperglycemia leads to damage within blood vessels which lead to the common complications seen with uncontrolled diabetes. Stressed to the patient the importance of improving glycemic control to prevent further complications from uncontrolled diabetes. Patient reports that he use to have FreeStyle Libre2 CGM sensors and his control was much better but his  old insurance would not cover it so he could not continue using it. Patient states that he would like to get back on FreeStyle Libre2 CGM sensors. Will ask attending provider to see if our team can provide samples of FreeStyle Libre2 CGM sensors. Patient states that he is almost out of Metformin, Lantus pens, and insulin pen needles. He also reports that he  moved from [facility] to [place] and has lost his glucometer and testing supplies. Informed patient I would like provider know he needed those DM medications and supplies.  Patient verbalized understanding of information discussed and reports no further questions at this time related to diabetes.   Thanks, Barnie Alderman, RN, MSN, CDE Diabetes Coordinator Inpatient Diabetes Program [phone] (Team Pager from 8am to 5pm)

## 2021-04-29 NOTE — Evaluation (Signed)
Occupational Therapy Evaluation Patient Details Name: Christopher Roberson MRN: [id number] DOB: 12/26/1975 Today's Date: 04/29/2021   History of Present Illness 46 y.o. male  presents 04/28/21 with  2 days history of RUE weakness and numbness and Left facial droop. 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 radiata. 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   Clinical Impression   PTA pt lives with his family who assist as needed with IADL tasks. Pt with mild increase in RUE coordination deficits and  would benefit from follow up with OT at a neuro outpt center. Pt states he has a history of "neck issues" and complains of increased RUE pain/tingling/numbness during a Spurling's test. Painful anterior aspect of R shoulder which may be supraspinatus tendonitis. Recommend further assessment of "neck issues" by neurosurgery/orthopedics as an outpt. All further OT to be addressed as an outpt.      Recommendations for follow up therapy are one component of a multi-disciplinary discharge planning process, led by the attending physician.  Recommendations may be updated based on patient status, additional functional criteria and insurance authorization.   Follow Up Recommendations  Outpatient OT (neuro outpt)    Assistance Recommended at Discharge Set up Supervision/Assistance  Patient can return home with the following A lot of help with walking and/or transfers;A little help with bathing/dressing/bathroom;Direct supervision/assist for medications management;Direct supervision/assist for financial management;Assist for transportation    Functional Status Assessment  Patient has not had a recent decline in their functional status  Equipment Recommendations  None recommended by OT    Recommendations for Other Services Other (comment)  (neuosurgery/orthopedic follow up as an outpt to assess cervical issues)     Precautions / Restrictions Precautions Precautions: Fall Precaution Comments: none within last 6 months Restrictions Weight Bearing Restrictions: No      Mobility Bed Mobility   OOB walking             Transfers Overall transfer level: Needs assistance Equipment used: None;Rolling walker (2 wheels) Transfers: Sit to/from Stand Sit to Stand: Supervision  Most likely close to baseline               Balance Overall balance assessment: Needs assistance Sitting-balance support: Feet supported Sitting balance-Leahy Scale: Good     Standing balance support: Bilateral upper extremity supported;Single extremity supported Standing balance-Leahy Scale: Poor Standing balance comment: requires at least single UE support                           ADL either performed or assessed with clinical judgement   ADL Overall ADL's : At baseline                                       General ADL Comments: overall set up/S; most lisely close to baseline     Vision Baseline Vision/History: 0 No visual deficits       Perception     Praxis      Pertinent Vitals/Pain Pain Assessment: 0-10 Pain Score: 7  Pain Location: R shoulder, R hip Adductor spasm Pain Descriptors / Indicators: Sharp;Shooting;Pins and needles;Tingling;Sore;Spasm Pain Intervention(s): Limited activity within patient's tolerance     Hand Dominance Right   Extremity/Trunk Assessment Upper Extremity Assessment Upper Extremity Assessment: RUE  deficits/detail RUE Deficits / Details: ataxic and uncoordinated movements however pt attempting to use RUE functionally; hx of "dropping things"; pt staets movemetn is "a little" worse than normal; R shoulder pain, worse anteriorly over supraspinatus tnedon; + empty can test; abnormal shoulder girldle strength; number tingling, especially forarm down RUE  Sensation: decreased light touch RUE Coordination: decreased fine motor;decreased gross motor   Lower Extremity Assessment Lower Extremity Assessment: RLE deficits/detail RLE Deficits / Details: ROM WFL, strength hip 3/5, knee flex/ext 4/5, plantarflex 4/5, dorsiflex 3+/5, pt utilizes back muscles to compensate for hip flexor strength, MMT of only hip flexors elicits cramping, pt reports cramping in LE muscles frequently RLE Sensation: decreased light touch RLE Coordination: decreased fine motor (requires assist to get R foot slid in [place])   Cervical / Trunk Assessment Cervical / Trunk Assessment: Other exceptions Cervical / Trunk Exceptions: + "Spurling's" test, increasing pain/tingling  RUE   Communication     Cognition Arousal/Alertness: Lethargic;Suspect due to medications (history of narcolepsy and medicated for MRI) Behavior During Therapy: Flat affect Overall Cognitive Status: No family/caregiver present to determine baseline cognitive functioning Area of Impairment: Following commands;Problem solving                       Following Commands: Follows one step commands with increased time;Follows multi-step commands with increased time     Problem Solving: Slow processing;Decreased initiation;Requires verbal cues;Requires tactile cues General Comments: most likely close to baseline; hx of head injury from fall per pt     General Comments  VSS on RA    Exercises     Shoulder Instructions      Home Living Family/patient expects to be discharged to:: Private residence Living Arrangements: Spouse/significant other Available Help at Discharge: Family;Available PRN/intermittently Type of Home: House Home Access: Level entry     Home Layout: Multi-level;Able to live on main level with bedroom/bathroom Alternate Level Stairs-Number of Steps: 15   Bathroom Shower/Tub: Teacher, early years/pre: Standard Bathroom Accessibility: Yes   Home Equipment:  Conservation officer, nature (2 wheels);Cane - single point          Prior Functioning/Environment Prior Level of Function : Needs assist  Cognitive Assist : ADLs (cognitive)   ADLs (Cognitive): Set up cues Physical Assist : Mobility (physical)     Mobility Comments: ambulates limited community distances with RW. ADLs Comments: independent        OT Problem List:        OT Treatment/Interventions:      OT Goals(Current goals can be found in the care plan section) Acute Rehab OT Goals Patient Stated Goal: to not have so much R shoulder and neck pain OT Goal Formulation: All assessment and education complete, DC therapy  OT Frequency:      Co-evaluation              AM-PAC OT "6 Clicks" Daily Activity     Outcome Measure Help from another person eating meals?: None Help from another person taking care of personal grooming?: A Little Help from another person toileting, which includes using toliet, bedpan, or urinal?: A Little Help from another person bathing (including washing, rinsing, drying)?: A Little Help from another person to put on and taking off regular upper body clothing?: A Little Help from another person to put on and taking off regular lower body clothing?: A Little 6 Click Score: 19   End of Session Equipment Utilized During Treatment: Rolling walker (2 wheels) Nurse  Communication: Mobility status;Other (comment) (DC needs)  Activity Tolerance: Patient tolerated treatment well Patient left: in chair;with call bell/phone within reach;with chair alarm set  OT Visit Diagnosis: Pain;Muscle weakness (generalized) (M62.81)                Time: 7619-5093 OT Time Calculation (min): 13 min Charges:  OT General Charges $OT Visit: 1 Visit OT Evaluation $OT Eval Low Complexity: [place]um, OT/L   Acute OT Clinical Specialist [place] Pager [phone] Office [phone]   [facility] 04/29/2021, 1:20 PM

## 2021-04-29 NOTE — Plan of Care (Signed)
Patient seen in consultation by Dr. Lorrin Goodell for neurology this AM. MRI brain showed small subacute infarct L corona radiata. TTE, A1c, LDL pending. Stroke team will f/u in AM.   Su Monks, MD [facility] [phone]  If 7pm- 7am, please page neurology on call as listed in [place].

## 2021-04-29 NOTE — Progress Notes (Signed)
Echocardiogram 2D Echocardiogram has been performed.  Christopher Roberson 04/29/2021, 2:09 PM

## 2021-04-29 NOTE — Plan of Care (Signed)
  Problem: Education: Goal: Knowledge of disease or condition will improve Outcome: Progressing Goal: Knowledge of secondary prevention will improve (SELECT ALL) Outcome: Progressing Goal: Knowledge of patient specific risk factors will improve (INDIVIDUALIZE FOR PATIENT) Outcome: Progressing Goal: Individualized Educational Video(s) Outcome: Progressing   

## 2021-04-29 NOTE — Consult Note (Addendum)
NEUROLOGY CONSULTATION NOTE   Date of service: April 29, 2021 Patient Name: Christopher Roberson MRN:  [id number] DOB:  Mar 14, 1976 Reason for consult: "L facial droop and RUE weakness" Requesting Provider: Kristopher Oppenheim, DO _ _ _   _ __   _ __ _ _  __ __   _ __   __ _  History of Present Illness  Christopher Roberson is a 46 y.o. 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  He was sitting on the table when his symptoms started. 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 Experiences some fluctuation from day to day but this facial droop appears to be constant. He also endorses numbness in the entire left half of his face. In addition, he reports R shoulder and elbow pain and tenderness to light palpation of the shoulder and elbow joint along with RUE weakness and numbness with the numbness extending to the Right half of his chest. 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  He does not smoke, no EtOH use, no recreational substances. Has diabetes with HbA1c of around 8 percent, HTN and takes meds but unclear how well it is controlled.  LKW: 04/26/20, in AM mRS: 0 tNKASE: not offered, outside window Thrombectomy: not offered, outside window NIHSS components Score: Comment  1a Level of Conscious 0[x]  1[]  2[]  3[]      1b LOC Questions 0[x]  1[]  2[]       1c LOC Commands 0[x]  1[]  2[]       2 Best Gaze 0[x]  1[]  2[]       3 Visual 0[x]  1[]  2[]  3[]      4 Facial Palsy 0[]  1[x]  2[]  3[]      5a Motor Arm - left 0[x]  1[]  2[]  3[]  4[]  UN[]    5b Motor Arm - Right 0[]  1[x]  2[]  3[]  4[]  UN[]    6a Motor Leg - Left 0[x]  1[]  2[]  3[]  4[]  UN[]    6b Motor Leg - Right 0[x]  1[]  2[]  3[]  4[]  UN[]    7 Limb Ataxia 0[x]  1[]  2[]  3[]  UN[]     8 Sensory 0[]  1[x]  2[]  UN[]      9  Best Language 0[x]  1[]  2[]  3[]      10 Dysarthria 0[x]  1[]  2[]  UN[]      11 Extinct. and Inattention 0[x]  1[]  2[]       TOTAL: 2        ROS   Constitutional Denies weight loss, fever and chills.   HEENT Denies changes in vision and hearing.   Respiratory Denies SOB and cough.   CV Denies palpitations and CP   GI Denies abdominal pain, nausea, vomiting and diarrhea.   GU Denies dysuria and urinary frequency.   MSK Denies myalgia, endorses pain in R shoulder and R elbow  Skin Denies rash and pruritus.   Neurological Denies headache and syncope.   Psychiatric Denies recent changes in mood. Denies anxiety and depression.   Past History   Past Medical History:  Diagnosis Date   Diabetes mellitus    Hypertension    Stroke [facility])    Past Surgical History:  Procedure Laterality Date   APPENDECTOMY     CARPAL TUNNEL RELEASE     HERNIA REPAIR     Family History  Problem Relation Age of Onset  Heart disease Mother    Social History   Socioeconomic History   Marital status: Married    Spouse name: Not on file   Number of children: Not on file   Years of education: Not on file   Highest education level: Not on file  Occupational History   Not on file  Tobacco Use   Smoking status: Never   Smokeless tobacco: Never  Vaping Use   Vaping Use: Never used  Substance and Sexual Activity   Alcohol use: No   Drug use: No   Sexual activity: Not on file  Other Topics Concern   Not on file  Social History Narrative   Not on file   Social Determinants of Health   Financial Resource Strain: Not on file  Food Insecurity: Not on file  Transportation Needs: Not on file  Physical Activity: Not on file  Stress: Not on file  Social Connections: Not on file   Allergies  Allergen Reactions   Percocet [Oxycodone-Acetaminophen] Itching    Medications   Medications Prior to Admission  Medication Sig Dispense Refill Last Dose   albuterol (VENTOLIN HFA) 108 (90 Base) MCG/ACT  inhaler Inhale 2 puffs into the lungs every 4 (four) hours as needed for wheezing or shortness of breath.      amLODipine (NORVASC) 10 MG tablet Take 1 tablet (10 mg total) by mouth every morning. Resume in 1 week      ARIPiprazole (ABILIFY) 5 MG tablet Take 5 mg by mouth every morning.      aspirin EC 81 MG tablet Take 81 mg by mouth every morning.      atorvastatin (LIPITOR) 80 MG tablet Take 80 mg by mouth every morning.      busPIRone (BUSPAR) 10 MG tablet Take 10 mg by mouth 3 (three) times daily.      cyclobenzaprine (FLEXERIL) 5 MG tablet Take 5 mg by mouth 3 (three) times daily as needed for muscle spasms.      DULoxetine (CYMBALTA) 30 MG capsule Take 30 mg by mouth every morning.      insulin aspart (NOVOLOG FLEXPEN) 100 UNIT/ML FlexPen Inject 14 Units into the skin 3 (three) times daily between meals.      insulin glargine (LANTUS) 100 UNIT/ML injection Inject 20-40 Units into the skin See admin instructions. Take 20 units in the morning, and 40 units in the evening      lisinopril (ZESTRIL) 40 MG tablet Take 1 tablet (40 mg total) by mouth every morning. Resume in  1 week      metFORMIN (GLUCOPHAGE) 1000 MG tablet Take 1,000 mg by mouth 2 (two) times a day.      metoprolol succinate (TOPROL-XL) 50 MG 24 hr tablet Take 50 mg by mouth daily.       pantoprazole (PROTONIX) 40 MG tablet Take 1 tablet (40 mg total) by mouth daily for 30 days. 30 tablet 0    predniSONE (STERAPRED UNI-PAK 48 TAB) 10 MG (48) TBPK tablet Please use per package instruction 48 tablet 0    pregabalin (LYRICA) 150 MG capsule Take 150 mg by mouth 2 (two) times a day.      Semaglutide,0.25 or 0.5MG /DOS, (OZEMPIC, 0.25 OR 0.5 MG/DOSE,) 2 MG/1.5ML SOPN Inject 0.25 mg into the skin every Monday.      sertraline (ZOLOFT) 100 MG tablet Take 200 mg by mouth daily.      terazosin (HYTRIN) 5 MG capsule Take 5 mg by mouth at bedtime.  traZODone (DESYREL) 100 MG tablet Take 100-300 mg by mouth at bedtime as needed for sleep.       Vitamin D, Ergocalciferol, (DRISDOL) 1.25 MG (50000 UT) CAPS capsule Take 50,000 Units by mouth every Monday, Wednesday, and Friday.        Vitals   Vitals:   04/28/21 2015 04/28/21 2300 04/28/21 2358 04/29/21 0047  BP: (!) 163/98 (!) 177/86  (!) 169/74  Pulse: 78 (!) 56 72 (!) 56  Resp: 16 18 (!) 39 (!) 26  Temp:   97.7 F (36.5 C) 98.6 F (37 C)  TempSrc:   Oral Oral  SpO2: 97% 100% 100% 100%  Weight:    131.1 kg  Height:    5\' 8"  (1.727 m)     Body mass index is 43.95 kg/m.  Physical Exam   General: Laying comfortably in bed; in no acute distress. HENT: Normal oropharynx and mucosa. Normal external appearance of ears and nose.  Neck: Supple, no pain or tenderness  CV: No JVD. No peripheral edema.  Pulmonary: Symmetric Chest rise. Normal respiratory effort.  Abdomen: Soft to touch, non-tender.  Ext: No cyanosis, edema, or deformity  Skin: No rash. Normal palpation of skin.  Musculoskeletal: Normal digits and nails by inspection. No clubbing.  Neurologic Examination  Mental status/Cognition: Alert, oriented to self, place, month and year, good attention. Speech/language: Fluent, comprehension intact, object naming intact, repetition intact. Cranial nerves:   CN II Pupils equal and reactive to light, no VF deficits   CN III,IV,VI EOM intact, no gaze preference or deviation, no nystagmus   CN V normal sensation in V1, V2, and V3 segments bilaterally   CN VII Mild L facial droop involving upper and lower face, which resolves when he is distracted.   CN VIII normal hearing to speech   CN IX & X normal palatal elevation, no uvular deviation   CN XI 5/5 head turn and 5/5 shoulder shrug bilaterally    CN XII midline tongue protrusion    Motor:  Muscle bulk: normal, tone normal, pronator drift RUE drift. tremor None Mvmt Root Nerve  Muscle Right Left Comments  SA C5/6 Ax Deltoid 4 5   EF C5/6 Mc Biceps 4 5   EE C6/7/8 Rad Triceps 4 5   WF C6/7 Med FCR     WE C7/8  PIN ECU     F Ab C8/T1 U ADM/FDI 4+ 5   HF L1/2/3 Fem Illopsoas 4 4   KE L2/3/4 Fem Quad 4 4   DF L4/5 D Peron Tib Ant 3 3   PF S1/2 Tibial Grc/Sol 3 3    Reflexes:  Right Left Comments  Pectoralis      Biceps (C5/6) 2 2   Brachioradialis (C5/6) 2 2    Triceps (C6/7) 2 2    Patellar (L3/4) 2 2    Achilles (S1)      Hoffman      Plantar     Jaw jerk    Sensation:  Light touch Decreased in entire R face, R shoulder and R Leg with midline splitting in face and in the chest.   Pin prick    Temperature    Vibration   Proprioception    Coordination/Complex Motor:  - Finger to Nose slowed in RUE but intact BL - Heel to shin intact BL - Rapid alternating movement are slowed in RUE - Gait: Deferred for patient safety.  Labs   CBC:  Recent Labs  Lab 04/28/21 1529  WBC 7.4  HGB 14.5  HCT 41.1  MCV 86.7  PLT 210    Basic Metabolic Panel:  Lab Results  Component Value Date   NA 134 (L) 04/28/2021   K 3.7 04/28/2021   CO2 23 04/28/2021   GLUCOSE 342 (H) 04/28/2021   BUN 8 04/28/2021   CREATININE 0.84 04/28/2021   CALCIUM 9.0 04/28/2021   GFRNONAA >60 04/28/2021   GFRAA >60 10/10/2018   Lipid Panel: No results found for: LDLCALC HgbA1c:  Lab Results  Component Value Date   HGBA1C 8.1 (H) 10/09/2018   Urine Drug Screen:     Component Value Date/Time   LABOPIA NONE DETECTED 04/29/2021 0005   COCAINSCRNUR NONE DETECTED 04/29/2021 0005   LABBENZ NONE DETECTED 04/29/2021 0005   AMPHETMU NONE DETECTED 04/29/2021 0005   THCU NONE DETECTED 04/29/2021 0005   LABBARB NONE DETECTED 04/29/2021 0005    Alcohol Level     Component Value Date/Time   ETH <10 04/28/2021 2033    CT Head without contrast(Personally reviewed): CTH was negative for a large hypodensity concerning for a large territory infarct or hyperdensity concerning for an ICH  CT angio Head and Neck with contrast(Personally reviewed): No LVO  MRI Brain and C spine: pending Impression   Christopher Roberson is a 46 y.o. 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  His weakness waxes and wanes, Facial droop dissappears when he is distracted. He has midline splitting sensory deficit and labelle indifference.  His presentation is most likely functional neurological deficit but this is a diagnosis of exclusion and given his stroke risk factors and prior hx of cervical degenerative disc disease, will recommend obtaining MRI Brain and C spine without contrast.  Impression: Functional Neurological disorder with weakness.  Recommendations  - MRI Brain and C spine without contrast. If negative for stroke or demyelination, no further workup. ______________________________________________________________________  Plan discussed with Dr. Alcario Drought with the overnight hospitalist team.  Thank you for the opportunity to take part in the care of this patient. If you have any further questions, please contact the neurology consultation attending.  Signed,  [place] Pager Number [phone] _ _ _   _ __   _ __ _ _  __ __   _ __   __ _

## 2021-04-29 NOTE — Progress Notes (Signed)
[facility] PROGRESS NOTE   Christopher Roberson GEZ:[id number] DOB: 11-17-1975 DOA: 04/28/2021  PCP: [name], FNP  Brief History/Interval Summary: 46 y.o. male with medical history significant of DM2, HTN, OSA presented with weakness in the right arm and leg.  Apparently seen at an outside hospital in January 2020 with residual numbness and left facial droop.  Apparently MRI at that time showed a small 5 mm acute cerebellar stroke which was not believed to be the cause of those symptoms.  Apparently ultimately diagnosed with Bell's palsy and stroke.  Subsequent MRIs showed chronic cerebellar stroke.  Patient presented with a left-sided facial droop and right arm and leg weakness.  Hospitalize for further management.   Reason for Visit: Right-sided weakness  Consultants: Neurology  Procedures: Transthoracic echocardiogram is pending    Subjective/Interval History: Patient mentioned that he continues to have weakness in his right arm and leg.  Denies any chest pain shortness of breath nausea or vomiting.  Also mentions of left-sided facial droop.    Assessment/Plan:  Right-sided weakness/acute stroke MRI does show left-sided stroke.  Echocardiogram is pending.  HbA1c is 10.5.  Lipid panel is pending.  Urine drug screen unremarkable. Continues to have right-sided weakness.  PT and OT evaluation is pending.  Currently on aspirin and Plavix. Wait for further input from neurology.  CT angiogram head and neck did not show any significant stenosis. Patient currently on atorvastatin.  Diabetes mellitus type 2, uncontrolled with hyperglycemia Compliance is questionable.  His HbA1c is 10.5.  Supposed to be on Lantus twice a day and NovoLog 3 times a day at home.  This regimen is being continued.  Monitor CBGs.  Essential hypertension Permissive hypertension for now.  Monitor blood pressures closely.  Hyperlipidemia Continue statin.  Check lipid panel.  Obesity Estimated  body mass index is 43.95 kg/m as calculated from the following:   Height as of this encounter: 5\' 8"  (1.727 m).   Weight as of this encounter: 131.1 kg.    DVT Prophylaxis: Lovenox Code Status: Full code Family Communication: Discussed with patient Disposition Plan: To be determined  Status is: Observation  The patient will require care spanning > 2 midnights and should be moved to inpatient because: Acute stroke and work-up required for same     Medications: Scheduled:  aspirin EC  81 mg Oral Daily   atorvastatin  80 mg Oral q morning   clopidogrel  75 mg Oral Daily   enoxaparin (LOVENOX) injection  40 mg Subcutaneous Q24H   [START ON 04/30/2021] influenza vac split quadrivalent PF  0.5 mL Intramuscular Tomorrow-1000   insulin aspart  0-20 Units Subcutaneous TID WC   insulin aspart  10 Units Subcutaneous TID WC   insulin glargine-yfgn  20 Units Subcutaneous Daily   insulin glargine-yfgn  40 Units Subcutaneous QHS   [START ON 04/30/2021] pneumococcal 23 valent vaccine  0.5 mL Intramuscular Tomorrow-1000   Continuous: YTK:[id number] **OR** acetaminophen (TYLENOL) oral liquid 160 mg/5 mL **OR** acetaminophen, ketorolac  Antibiotics: Anti-infectives (From admission, onward)    None       Objective:  Vital Signs  Vitals:   04/29/21 0208 04/29/21 0411 04/29/21 0529 04/29/21 0826  BP: (!) 163/73 (!) 171/76 (!) 158/88 (!) 168/93  Pulse: (!) 52 61 (!) 59 (!) 58  Resp: (!) 24 (!) 22 (!) 22 16  Temp: 98 F (36.7 C) 97.9 F (36.6 C) 98.1 F (36.7 C) 97.8 F (36.6 C)  TempSrc: Oral Oral Oral Oral  SpO2:  100% 99% 98% 97%  Weight:      Height:        Intake/Output Summary (Last 24 hours) at 04/29/2021 1106 Last data filed at 04/29/2021 0204 Gross per 24 hour  Intake --  Output 400 ml  Net -400 ml   Filed Weights   04/28/21 1523 04/29/21 0047  Weight: 129.7 kg 131.1 kg    General appearance: Awake alert.  In no distress Resp: Clear to auscultation bilaterally.   Normal effort Cardio: S1-S2 is normal regular.  No S3-S4.  No rubs murmurs or bruit GI: Abdomen is soft.  Nontender nondistended.  Bowel sounds are present normal.  No masses organomegaly Extremities: No edema.  Full range of motion of lower extremities. Neurologic: Alert and oriented x3.  Subtle left facial asymmetry is noted.  Noticed to have right upper extremity and right lower extremity weakness.  Dysdiadochokinesis is noted.   Lab Results:  Data Reviewed: I have personally reviewed following labs and imaging studies  CBC: Recent Labs  Lab 04/28/21 1529  WBC 7.4  HGB 14.5  HCT 41.1  MCV 86.7  PLT 983    Basic Metabolic Panel: Recent Labs  Lab 04/28/21 1529  NA 134*  K 3.7  CL 100  CO2 23  GLUCOSE 342*  BUN 8  CREATININE 0.84  CALCIUM 9.0    GFR: Estimated Creatinine Clearance: 145.3 mL/min (by C-G formula based on SCr of 0.84 mg/dL).  Liver Function Tests: Recent Labs  Lab 04/28/21 2033  AST 24  ALT 20  ALKPHOS 60  BILITOT 0.9  PROT 7.4  ALBUMIN 3.8      Coagulation Profile: Recent Labs  Lab 04/28/21 2033  INR 1.0     HbA1C: Recent Labs    04/29/21 0537  HGBA1C 10.5*    CBG: Recent Labs  Lab 04/29/21 0138 04/29/21 0621  GLUCAP 274* 297*     Recent Results (from the past 240 hour(s))  Resp Panel by RT-PCR (Flu A&B, Covid) Nasopharyngeal Swab     Status: None   Collection Time: 04/28/21  8:33 PM   Specimen: Nasopharyngeal Swab; Nasopharyngeal(NP) swabs in vial transport medium  Result Value Ref Range Status   SARS Coronavirus 2 by RT PCR NEGATIVE NEGATIVE Final    Comment: (NOTE) SARS-CoV-2 target nucleic acids are NOT DETECTED.  The SARS-CoV-2 RNA is generally detectable in upper respiratory specimens during the acute phase of infection. The lowest concentration of SARS-CoV-2 viral copies this assay can detect is 138 copies/mL. A negative result does not preclude SARS-Cov-2 infection and should not be used as the sole basis  for treatment or other patient management decisions. A negative result may occur with  improper specimen collection/handling, submission of specimen other than nasopharyngeal swab, presence of viral mutation(s) within the areas targeted by this assay, and inadequate number of viral copies(<138 copies/mL). A negative result must be combined with clinical observations, patient history, and epidemiological information. The expected result is Negative.  Fact Sheet for Patients:  EntrepreneurPulse.com.au  Fact Sheet for Healthcare Providers:  IncredibleEmployment.be  This test is no t yet approved or cleared by the [place] FDA and  has been authorized for detection and/or diagnosis of SARS-CoV-2 by FDA under an Emergency Use Authorization (EUA). This EUA will remain  in effect (meaning this test can be used) for the duration of the COVID-19 declaration under Section 564(b)(1) of the Act, 21 U.S.C.section 360bbb-3(b)(1), unless the authorization is terminated  or revoked sooner.  Influenza A by PCR NEGATIVE NEGATIVE Final   Influenza B by PCR NEGATIVE NEGATIVE Final    Comment: (NOTE) The Xpert Xpress SARS-CoV-2/FLU/RSV plus assay is intended as an aid in the diagnosis of influenza from Nasopharyngeal swab specimens and should not be used as a sole basis for treatment. Nasal washings and aspirates are unacceptable for Xpert Xpress SARS-CoV-2/FLU/RSV testing.  Fact Sheet for Patients: EntrepreneurPulse.com.au  Fact Sheet for Healthcare Providers: IncredibleEmployment.be  This test is not yet approved or cleared by the [place] FDA and has been authorized for detection and/or diagnosis of SARS-CoV-2 by FDA under an Emergency Use Authorization (EUA). This EUA will remain in effect (meaning this test can be used) for the duration of the COVID-19 declaration under Section 564(b)(1) of the Act, 21  U.S.C. section 360bbb-3(b)(1), unless the authorization is terminated or revoked.  Performed at [facility], [address] [place], [place] [address]       Radiology Studies: CT ANGIO HEAD NECK W WO CM  Result Date: 04/28/2021 CLINICAL DATA:  Follow-up examination for stroke. EXAM: CT ANGIOGRAPHY HEAD AND NECK TECHNIQUE: Multidetector CT imaging of the head and neck was performed using the standard protocol during bolus administration of intravenous contrast. Multiplanar CT image reconstructions and MIPs were obtained to evaluate the vascular anatomy. Carotid stenosis measurements (when applicable) are obtained utilizing NASCET criteria, using the distal internal carotid diameter as the denominator. CONTRAST:  171mL OMNIPAQUE IOHEXOL 350 MG/ML SOLN COMPARISON:  CT from earlier the same day. FINDINGS: CTA NECK FINDINGS Aortic arch: Visualized aortic arch normal caliber with normal 3 vessel morphology. No stenosis about the origin the great vessels. Right carotid system: Right common and internal carotid arteries widely patent without stenosis, dissection or occlusion. Mild atheromatous change about the right carotid bulb without stenosis. Left carotid system: Left common and internal carotid arteries widely patent without stenosis, dissection or occlusion. Mild atheromatous change about the left carotid bulb without significant stenosis. Vertebral arteries: Both vertebral arteries arise from the subclavian arteries. No proximal subclavian artery stenosis. Both vertebral arteries widely patent without stenosis, dissection or occlusion. Skeleton: No discrete or worrisome osseous lesions. Corticated osseous density at the tip of the dens chronic and stable from prior. Other neck: No other acute soft tissue abnormality within the neck. Upper chest: Visualized upper chest demonstrates no acute finding. Review of the MIP images confirms the above findings CTA HEAD FINDINGS Anterior circulation:  Both internal carotid arteries widely patent to the termini without stenosis. A1 segments widely patent. Normal anterior communicating artery complex. Both anterior cerebral arteries widely patent to their distal aspects without stenosis. No M1 stenosis or occlusion. Normal MCA bifurcations. Distal MCA branches well perfused and symmetric. Posterior circulation: Both V4 segments patent to the vertebrobasilar junction without stenosis. Neither PICA origin well visualized. Basilar widely patent to its distal aspect without stenosis. Superior cerebellar arteries patent bilaterally. Both PCAs primarily supplied via the basilar and are well perfused to there distal aspects. Venous sinuses: Patent allowing for timing the contrast bolus. Anatomic variants: None significant.  No aneurysm. Review of the MIP images confirms the above findings IMPRESSION: Negative CTA of the head and neck. No large vessel occlusion or other acute vascular abnormality. Mild atheromatous change about the carotid bifurcations without hemodynamically significant stenosis. Electronically Signed   By: Jeannine Boga M.D.   On: 04/28/2021 22:21   DG Chest 2 View  Result Date: 04/28/2021 CLINICAL DATA:  Chest pain. EXAM: CHEST - 2 VIEW COMPARISON:  July 30, 2020. FINDINGS: The heart size and mediastinal contours are within normal limits. Both lungs are clear. The visualized skeletal structures are unremarkable. IMPRESSION: No active cardiopulmonary disease. Electronically Signed   By: Marijo Conception M.D.   On: 04/28/2021 15:51   CT HEAD WO CONTRAST  Result Date: 04/28/2021 CLINICAL DATA:  History of chest pain, right upper extremity weakness for 2 days EXAM: CT HEAD WITHOUT CONTRAST TECHNIQUE: Contiguous axial images were obtained from the base of the skull through the vertex without intravenous contrast. COMPARISON:  10/20/2016 FINDINGS: Brain: No acute infarct or hemorrhage. Lateral ventricles and midline structures are unremarkable. No  acute extra-axial fluid collections. No mass effect. Vascular: No hyperdense vessel or unexpected calcification. Skull: Normal. Negative for fracture or focal lesion. Sinuses/Orbits: No acute finding. Other: None. IMPRESSION: 1. No acute intracranial process. Electronically Signed   By: Randa Ngo M.D.   On: 04/28/2021 20:29   MR BRAIN WO CONTRAST  Result Date: 04/29/2021 CLINICAL DATA:  46 year old male with recent right upper extremity weakness, chest pain. EXAM: MRI HEAD WITHOUT CONTRAST TECHNIQUE: Multiplanar, multiecho pulse sequences of the brain and surrounding structures were obtained without intravenous contrast. COMPARISON:  CT head and CTA head and neck yesterday. Cervical spine MRI today reported separately. Brain and cervical spine MRI 07/30/2020. FINDINGS: Brain: Subtle linear abnormal diffusion in the left corona radiata on series 5, image 90 is associated with T2 and FLAIR hyperintensity, but isointense on ADC. This is new from last year. No other abnormal diffusion. And elsewhere only minimal nonspecific cerebral white matter T2 and FLAIR hyperintensity. No midline shift, mass effect, evidence of mass lesion, ventriculomegaly, extra-axial collection or acute intracranial hemorrhage. Cervicomedullary junction and pituitary are within normal limits. No cortical encephalomalacia or chronic cerebral blood products. Cerebral volume is stable since last year and within normal limits. Vascular: Major intracranial vascular flow voids are stable. Skull and upper cervical spine: Cervical spine is detailed separately. Negative skull and bone marrow signal. Sinuses/Orbits: Disconjugate gaze. Paranasal sinuses and mastoids are stable and well aerated. Other: Grossly normal visible internal auditory structures. Negative visible scalp and face. IMPRESSION: 1. Small subacute white matter lacunar infarct in the left corona radiata. No associated hemorrhage or mass effect. 2. No other acute intracranial  abnormality. And elsewhere minimal nonspecific white matter signal changes. Electronically Signed   By: Genevie Ann M.D.   On: 04/29/2021 10:57   MR CERVICAL SPINE WO CONTRAST  Result Date: 04/29/2021 CLINICAL DATA:  46 year old male with recent right upper extremity weakness, chest pain. EXAM: MRI CERVICAL SPINE WITHOUT CONTRAST TECHNIQUE: Multiplanar, multisequence MR imaging of the cervical spine was performed. No intravenous contrast was administered. COMPARISON:  Brain MRI today.  Cervical spine MRI 07/30/2020. FINDINGS: Alignment: Increase straightening of cervical lordosis. No spondylolisthesis. Vertebrae: No marrow edema or evidence of acute osseous abnormality. Visualized bone marrow signal is within normal limits. Cord: No spinal cord signal abnormality associated with mild degenerative cord mass effect detailed below. Posterior Fossa, vertebral arteries, paraspinal tissues: Cervicomedullary junction is within normal limits. Brain is detailed separately today. Preserved major vascular flow voids in the neck. Negative visible neck soft tissues. Disc levels: C2-C3:  Stable, negative. C3-C4: Small central disc protrusion best seen on series 9, image 15 appears mildly increased from last year and results in mild spinal stenosis with mild ventral cord mass effect. Mild underlying disc bulging. Mild right C4 foraminal stenosis. This level has progressed. C4-C5: Small central disc protrusion (series 9, image 20) appears stable.  No stenosis. C5-C6:  Negative. C6-C7: Mild rightward disc bulge and endplate spurring. Stable mild right C7 foraminal stenosis. C7-T1: Stable mild rightward disc bulging and endplate spurring. Stable mild right C8 foraminal stenosis. Negative visible upper thoracic levels. IMPRESSION: 1. A small central disc herniation at C3-C4 appears progressed since last year along with mild spinal stenosis and mild degenerative cord mass effect. No spinal cord signal abnormality. Mild right C4 foraminal  stenosis. 2. Stable mild cervical spine degeneration elsewhere. Electronically Signed   By: Genevie Ann M.D.   On: 04/29/2021 11:03       LOS: 0 days   [facility] Pager on [email or web address]  04/29/2021, 11:06 AM

## 2021-04-29 NOTE — Plan of Care (Signed)
°  Problem: Education: Goal: Knowledge of disease or condition will improve Outcome: Progressing Goal: Knowledge of secondary prevention will improve (SELECT ALL) Outcome: Progressing Goal: Knowledge of patient specific risk factors will improve (INDIVIDUALIZE FOR PATIENT) Outcome: Progressing Goal: Individualized Educational Video(s) Outcome: Progressing   Problem: Ischemic Stroke/TIA Tissue Perfusion: Goal: Complications of ischemic stroke/TIA will be minimized Outcome: Progressing

## 2021-04-29 NOTE — TOC Initial Note (Signed)
Transition of Care [facility]) - Initial/Assessment Note    Patient Details  Name: Christopher Roberson MRN: [id number] Date of Birth: 1975-05-31  [facility] CM/SW Contact:    Pollie Friar, RN Phone Number: 04/29/2021, 3:46 PM  Clinical Narrative:                 Patient is from home with spouse and adult son. Pt states he manages his own medications at home and denies any issues.  Pt states he has transport to needed appointments.  Recommendations for outpatient therapy. Pt prefers to attend at [facility]. Orders in Epic and information on the AVS. Pt has transport home when medically ready.  TOC following.  Expected Discharge Plan: OP Rehab Barriers to Discharge: Continued Medical Work up   Patient Goals and CMS Choice     Choice offered to / list presented to : Patient  Expected Discharge Plan and Services Expected Discharge Plan: OP Rehab   Discharge Planning Services: CM Consult   Living arrangements for the past 2 months: Single Family Home                                      Prior Living Arrangements/Services Living arrangements for the past 2 months: Single Family Home Lives with:: Adult Children, Spouse Patient language and need for interpreter reviewed:: Yes Do you feel safe going back to the place where you live?: Yes      Need for Family Participation in Patient Care: Yes (Comment) Care giver support system in place?: Yes (comment) Current home services: DME (walker/ rollator) Criminal Activity/Legal Involvement Pertinent to Current Situation/Hospitalization: No - Comment as needed  Activities of Daily Living Home Assistive Devices/Equipment: Walker (specify type) ADL Screening (condition at time of admission) Patient's cognitive ability adequate to safely complete daily activities?: Yes Is the patient deaf or have difficulty hearing?: No Does the patient have difficulty seeing, even when wearing glasses/contacts?: Yes Does the  patient have difficulty concentrating, remembering, or making decisions?: No Patient able to express need for assistance with ADLs?: Yes Does the patient have difficulty dressing or bathing?: Yes Independently performs ADLs?: Yes (appropriate for developmental age) Does the patient have difficulty walking or climbing stairs?: Yes Weakness of Legs: Both Weakness of Arms/Hands: Right  Permission Sought/Granted                  Emotional Assessment Appearance:: Appears stated age Attitude/Demeanor/Rapport: Engaged Affect (typically observed): Accepting Orientation: : Oriented to Self, Oriented to Place, Oriented to  Time, Oriented to Situation   Psych Involvement: No (comment)  Admission diagnosis:  Stroke [facility]) [I63.9] Acute CVA (cerebrovascular accident) [facility]) [I63.9] Patient Active Problem List   Diagnosis Date Noted   HLD (hyperlipidemia) 04/29/2021   Acute CVA (cerebrovascular accident) ([place]) 04/29/2021   Stroke ([place]) 04/28/2021   COVID-19 virus infection    Suspected COVID-19 virus infection 10/09/2018   Viral gastroenteritis 10/09/2018   Type 2 diabetes mellitus ([place]) 10/09/2018   Essential hypertension 10/09/2018   Acute respiratory failure with hypoxia ([place]) 10/08/2018   PCP:  [name], FNP Pharmacy:   [facility], [place] [address] [place] [address] Phone: [phone] Fax: [phone]     Social Determinants of Health (SDOH) Interventions    Readmission Risk Interventions No flowsheet data found.

## 2021-04-30 LAB — BASIC METABOLIC PANEL
Anion gap: 6 (ref 5–15)
BUN: 11 mg/dL (ref 6–20)
CO2: 25 mmol/L (ref 22–32)
Calcium: 8.6 mg/dL — ABNORMAL LOW (ref 8.9–10.3)
Chloride: 103 mmol/L (ref 98–111)
Creatinine, Ser: 0.94 mg/dL (ref 0.61–1.24)
GFR, Estimated: >60 mL/min (ref >60)
Glucose, Bld: 284 mg/dL — ABNORMAL HIGH (ref 70–99)
Potassium: 3.6 mmol/L (ref 3.5–5.1)
Sodium: 134 mmol/L — ABNORMAL LOW (ref 135–145)

## 2021-04-30 LAB — HEMOGLOBIN A1C
Hgb A1c MFr Bld: 10.8 % — ABNORMAL HIGH (ref 4.8–5.6)
Mean Plasma Glucose: 263.26 mg/dL

## 2021-04-30 LAB — CBC
HCT: 38.3 % — ABNORMAL LOW (ref 39.0–52.0)
Hemoglobin: 13.3 g/dL (ref 13.0–17.0)
MCH: 30.6 pg (ref 26.0–34.0)
MCHC: 34.7 g/dL (ref 30.0–36.0)
MCV: 88.2 fL (ref 80.0–100.0)
Platelets: 235 10*3/uL (ref 150–400)
RBC: 4.34 MIL/uL (ref 4.22–5.81)
RDW: 11.7 % (ref 11.5–15.5)
WBC: 7.2 10*3/uL (ref 4.0–10.5)
nRBC: 0 % (ref 0.0–0.2)

## 2021-04-30 LAB — GLUCOSE, CAPILLARY
Glucose-Capillary: 304 mg/dL — ABNORMAL HIGH (ref 70–99)
Glucose-Capillary: 235 mg/dL — ABNORMAL HIGH (ref 70–99)

## 2021-04-30 LAB — LIPID PANEL
Cholesterol: 191 mg/dL (ref 0–200)
HDL: 55 mg/dL (ref >40)
LDL Cholesterol: 115 mg/dL — ABNORMAL HIGH (ref 0–99)
Total CHOL/HDL Ratio: 3.5 RATIO
Triglycerides: 106 mg/dL (ref <150)
VLDL: 21 mg/dL (ref 0–40)

## 2021-04-30 MED ORDER — BLOOD GLUCOSE MONITOR KIT: PACK | 0 refills | Status: AC

## 2021-04-30 MED ORDER — METOPROLOL SUCCINATE ER 50 MG PO TB24: 50.0000 mg | ORAL_TABLET | Freq: Every day | ORAL | 2 refills | Status: DC

## 2021-04-30 MED ORDER — LANTUS SOLOSTAR 100 UNIT/ML ~~LOC~~ SOPN
PEN_INJECTOR | SUBCUTANEOUS | 11 refills | Status: DC
Start: 2021-04-30 — End: 2022-07-06

## 2021-04-30 MED ORDER — METFORMIN HCL 1000 MG PO TABS: 1000.0000 mg | ORAL_TABLET | Freq: Two times a day (BID) | ORAL | 2 refills | Status: AC

## 2021-04-30 MED ORDER — CLOPIDOGREL BISULFATE 75 MG PO TABS: 75.0000 mg | ORAL_TABLET | Freq: Every day | ORAL | 3 refills | Status: AC

## 2021-04-30 MED ORDER — ATORVASTATIN CALCIUM 80 MG PO TABS: 80.0000 mg | ORAL_TABLET | Freq: Every morning | ORAL | 1 refills | Status: DC

## 2021-04-30 MED ORDER — ASPIRIN EC 81 MG PO TBEC: 81.0000 mg | DELAYED_RELEASE_TABLET | Freq: Every morning | ORAL | 11 refills | Status: AC

## 2021-04-30 MED ORDER — INSULIN PEN NEEDLE 32G X 4 MM MISC: 2 refills | Status: DC

## 2021-04-30 NOTE — Discharge Summary (Signed)
[facility]  Physician Discharge Summary   Patient ID: Christopher Roberson MRN: [id number] DOB/AGE: December 31, 1975 46 y.o.  Admit date: 04/28/2021 Discharge date: 04/30/2021    PCP: [name], FNP  DISCHARGE DIAGNOSES:  Acute stroke Diabetes mellitus type 2, uncontrolled with hyperglycemia Essential hypertension Hyperlipidemia   RECOMMENDATIONS FOR OUTPATIENT FOLLOW UP: Ambulatory referral sent to neurology Follow-up with primary care provider/endocrinology for management of diabetes   Home Health: Outpatient PT and OT Equipment/Devices: None  CODE STATUS: Full code  DISCHARGE CONDITION: fair  Diet recommendation: Modified carbohydrate  INITIAL HISTORY: 46 y.o. male with medical history significant of DM2, HTN, OSA presented with weakness in the right arm and leg.  Apparently seen at an outside hospital in January 2020 with residual numbness and left facial droop.  Apparently MRI at that time showed a small 5 mm acute cerebellar stroke which was not believed to be the cause of those symptoms.  Apparently ultimately diagnosed with Bell's palsy and stroke.  Subsequent MRIs showed chronic cerebellar stroke.  Patient presented with a left-sided facial droop and right arm and leg weakness.  Hospitalize for further management.    Reason for Visit: Right-sided weakness   Consultants: Neurology   Procedures: Transthoracic echocardiogram    HOSPITAL COURSE:   Right-sided weakness/acute stroke MRI does show left-sided stroke.  CT angiogram head and neck did not show any significant stenosis. Patient was seen by neurology.  Recommend aspirin and Plavix for 3 weeks followed by Plavix alone HbA1c is 10.5.  LDL 115.  Echocardiogram shows normal systolic function with moderate LVH. Statin has been prescribed. Seen by PT and OT.  Outpatient PT OT recommended which has been ordered   Diabetes mellitus type 2, uncontrolled with hyperglycemia Compliance is questionable.   His HbA1c is 10.5.  Seen by diabetes coordinator.  Prescription sent for Lantus pens.  Follow-up with his PCP/endocrinologist.   Essential hypertension Allowed permissive hypertension.  May resume his home medication regimen of amlodipine, chlorthalidone, lisinopril, metoprolol.  Also noted to be on ARB.   Hyperlipidemia Statin prescribed.   Obesity Estimated body mass index is 43.95 kg/m as calculated from the following:   Height as of this encounter: 5' 8"  (1.727 m).   Weight as of this encounter: 131.1 kg.  Patient is stable.  Okay for discharge home today.   PERTINENT LABS:  The results of significant diagnostics from this hospitalization (including imaging, microbiology, ancillary and laboratory) are listed below for reference.    Microbiology: Recent Results (from the past 240 hour(s))  Resp Panel by RT-PCR (Flu A&B, Covid) Nasopharyngeal Swab     Status: None   Collection Time: 04/28/21  8:33 PM   Specimen: Nasopharyngeal Swab; Nasopharyngeal(NP) swabs in vial transport medium  Result Value Ref Range Status   SARS Coronavirus 2 by RT PCR NEGATIVE NEGATIVE Final    Comment: (NOTE) SARS-CoV-2 target nucleic acids are NOT DETECTED.  The SARS-CoV-2 RNA is generally detectable in upper respiratory specimens during the acute phase of infection. The lowest concentration of SARS-CoV-2 viral copies this assay can detect is 138 copies/mL. A negative result does not preclude SARS-Cov-2 infection and should not be used as the sole basis for treatment or other patient management decisions. A negative result may occur with  improper specimen collection/handling, submission of specimen other than nasopharyngeal swab, presence of viral mutation(s) within the areas targeted by this assay, and inadequate number of viral copies(<138 copies/mL). A negative result must be combined with clinical observations, patient history, and  epidemiological information. The expected result is  Negative.  Fact Sheet for Patients:  EntrepreneurPulse.com.au  Fact Sheet for Healthcare Providers:  IncredibleEmployment.be  This test is no t yet approved or cleared by the [place] FDA and  has been authorized for detection and/or diagnosis of SARS-CoV-2 by FDA under an Emergency Use Authorization (EUA). This EUA will remain  in effect (meaning this test can be used) for the duration of the COVID-19 declaration under Section 564(b)(1) of the Act, 21 U.S.C.section 360bbb-3(b)(1), unless the authorization is terminated  or revoked sooner.       Influenza A by PCR NEGATIVE NEGATIVE Final   Influenza B by PCR NEGATIVE NEGATIVE Final    Comment: (NOTE) The Xpert Xpress SARS-CoV-2/FLU/RSV plus assay is intended as an aid in the diagnosis of influenza from Nasopharyngeal swab specimens and should not be used as a sole basis for treatment. Nasal washings and aspirates are unacceptable for Xpert Xpress SARS-CoV-2/FLU/RSV testing.  Fact Sheet for Patients: EntrepreneurPulse.com.au  Fact Sheet for Healthcare Providers: IncredibleEmployment.be  This test is not yet approved or cleared by the [place] FDA and has been authorized for detection and/or diagnosis of SARS-CoV-2 by FDA under an Emergency Use Authorization (EUA). This EUA will remain in effect (meaning this test can be used) for the duration of the COVID-19 declaration under Section 564(b)(1) of the Act, 21 U.S.C. section 360bbb-3(b)(1), unless the authorization is terminated or revoked.  Performed at [facility] [address] [place], [place] [address]      Labs:  COVID-19 Labs   Lab Results  Component Value Date   SARSCOV2NAA NEGATIVE 04/28/2021   SARSCOV2NAA DETECTED (A) 10/09/2018      Basic Metabolic Panel: Recent Labs  Lab 04/28/21 1529 04/30/21 0023  NA 134* 134*  K 3.7 3.6  CL 100 103  CO2 23 25   GLUCOSE 342* 284*  BUN 8 11  CREATININE 0.84 0.94  CALCIUM 9.0 8.6*   Liver Function Tests: Recent Labs  Lab 04/28/21 2033  AST 24  ALT 20  ALKPHOS 60  BILITOT 0.9  PROT 7.4  ALBUMIN 3.8    CBC: Recent Labs  Lab 04/28/21 1529 04/30/21 0023  WBC 7.4 7.2  HGB 14.5 13.3  HCT 41.1 38.3*  MCV 86.7 88.2  PLT 259 235     CBG: Recent Labs  Lab 04/29/21 1218 04/29/21 1644 04/29/21 2123 04/30/21 0602 04/30/21 1144  GLUCAP 233* 130* 304* 304* 235*     IMAGING STUDIES CT ANGIO HEAD NECK W WO CM  Result Date: 04/28/2021 CLINICAL DATA:  Follow-up examination for stroke. EXAM: CT ANGIOGRAPHY HEAD AND NECK TECHNIQUE: Multidetector CT imaging of the head and neck was performed using the standard protocol during bolus administration of intravenous contrast. Multiplanar CT image reconstructions and MIPs were obtained to evaluate the vascular anatomy. Carotid stenosis measurements (when applicable) are obtained utilizing NASCET criteria, using the distal internal carotid diameter as the denominator. CONTRAST:  169m OMNIPAQUE IOHEXOL 350 MG/ML SOLN COMPARISON:  CT from earlier the same day. FINDINGS: CTA NECK FINDINGS Aortic arch: Visualized aortic arch normal caliber with normal 3 vessel morphology. No stenosis about the origin the great vessels. Right carotid system: Right common and internal carotid arteries widely patent without stenosis, dissection or occlusion. Mild atheromatous change about the right carotid bulb without stenosis. Left carotid system: Left common and internal carotid arteries widely patent without stenosis, dissection or occlusion. Mild atheromatous change about the left carotid bulb without significant stenosis.  Vertebral arteries: Both vertebral arteries arise from the subclavian arteries. No proximal subclavian artery stenosis. Both vertebral arteries widely patent without stenosis, dissection or occlusion. Skeleton: No discrete or worrisome osseous lesions.  Corticated osseous density at the tip of the dens chronic and stable from prior. Other neck: No other acute soft tissue abnormality within the neck. Upper chest: Visualized upper chest demonstrates no acute finding. Review of the MIP images confirms the above findings CTA HEAD FINDINGS Anterior circulation: Both internal carotid arteries widely patent to the termini without stenosis. A1 segments widely patent. Normal anterior communicating artery complex. Both anterior cerebral arteries widely patent to their distal aspects without stenosis. No M1 stenosis or occlusion. Normal MCA bifurcations. Distal MCA branches well perfused and symmetric. Posterior circulation: Both V4 segments patent to the vertebrobasilar junction without stenosis. Neither PICA origin well visualized. Basilar widely patent to its distal aspect without stenosis. Superior cerebellar arteries patent bilaterally. Both PCAs primarily supplied via the basilar and are well perfused to there distal aspects. Venous sinuses: Patent allowing for timing the contrast bolus. Anatomic variants: None significant.  No aneurysm. Review of the MIP images confirms the above findings IMPRESSION: Negative CTA of the head and neck. No large vessel occlusion or other acute vascular abnormality. Mild atheromatous change about the carotid bifurcations without hemodynamically significant stenosis. Electronically Signed   By: Jeannine Boga M.D.   On: 04/28/2021 22:21   DG Chest 2 View  Result Date: 04/28/2021 CLINICAL DATA:  Chest pain. EXAM: CHEST - 2 VIEW COMPARISON:  July 30, 2020. FINDINGS: The heart size and mediastinal contours are within normal limits. Both lungs are clear. The visualized skeletal structures are unremarkable. IMPRESSION: No active cardiopulmonary disease. Electronically Signed   By: Marijo Conception M.D.   On: 04/28/2021 15:51   CT HEAD WO CONTRAST  Result Date: 04/28/2021 CLINICAL DATA:  History of chest pain, right upper extremity  weakness for 2 days EXAM: CT HEAD WITHOUT CONTRAST TECHNIQUE: Contiguous axial images were obtained from the base of the skull through the vertex without intravenous contrast. COMPARISON:  10/20/2016 FINDINGS: Brain: No acute infarct or hemorrhage. Lateral ventricles and midline structures are unremarkable. No acute extra-axial fluid collections. No mass effect. Vascular: No hyperdense vessel or unexpected calcification. Skull: Normal. Negative for fracture or focal lesion. Sinuses/Orbits: No acute finding. Other: None. IMPRESSION: 1. No acute intracranial process. Electronically Signed   By: Randa Ngo M.D.   On: 04/28/2021 20:29   MR BRAIN WO CONTRAST  Result Date: 04/29/2021 CLINICAL DATA:  46 year old male with recent right upper extremity weakness, chest pain. EXAM: MRI HEAD WITHOUT CONTRAST TECHNIQUE: Multiplanar, multiecho pulse sequences of the brain and surrounding structures were obtained without intravenous contrast. COMPARISON:  CT head and CTA head and neck yesterday. Cervical spine MRI today reported separately. Brain and cervical spine MRI 07/30/2020. FINDINGS: Brain: Subtle linear abnormal diffusion in the left corona radiata on series 5, image 90 is associated with T2 and FLAIR hyperintensity, but isointense on ADC. This is new from last year. No other abnormal diffusion. And elsewhere only minimal nonspecific cerebral white matter T2 and FLAIR hyperintensity. No midline shift, mass effect, evidence of mass lesion, ventriculomegaly, extra-axial collection or acute intracranial hemorrhage. Cervicomedullary junction and pituitary are within normal limits. No cortical encephalomalacia or chronic cerebral blood products. Cerebral volume is stable since last year and within normal limits. Vascular: Major intracranial vascular flow voids are stable. Skull and upper cervical spine: Cervical spine is detailed separately. Negative skull and  bone marrow signal. Sinuses/Orbits: Disconjugate gaze.  Paranasal sinuses and mastoids are stable and well aerated. Other: Grossly normal visible internal auditory structures. Negative visible scalp and face. IMPRESSION: 1. Small subacute white matter lacunar infarct in the left corona radiata. No associated hemorrhage or mass effect. 2. No other acute intracranial abnormality. And elsewhere minimal nonspecific white matter signal changes. Electronically Signed   By: Genevie Ann M.D.   On: 04/29/2021 10:57   MR CERVICAL SPINE WO CONTRAST  Result Date: 04/29/2021 CLINICAL DATA:  46 year old male with recent right upper extremity weakness, chest pain. EXAM: MRI CERVICAL SPINE WITHOUT CONTRAST TECHNIQUE: Multiplanar, multisequence MR imaging of the cervical spine was performed. No intravenous contrast was administered. COMPARISON:  Brain MRI today.  Cervical spine MRI 07/30/2020. FINDINGS: Alignment: Increase straightening of cervical lordosis. No spondylolisthesis. Vertebrae: No marrow edema or evidence of acute osseous abnormality. Visualized bone marrow signal is within normal limits. Cord: No spinal cord signal abnormality associated with mild degenerative cord mass effect detailed below. Posterior Fossa, vertebral arteries, paraspinal tissues: Cervicomedullary junction is within normal limits. Brain is detailed separately today. Preserved major vascular flow voids in the neck. Negative visible neck soft tissues. Disc levels: C2-C3:  Stable, negative. C3-C4: Small central disc protrusion best seen on series 9, image 15 appears mildly increased from last year and results in mild spinal stenosis with mild ventral cord mass effect. Mild underlying disc bulging. Mild right C4 foraminal stenosis. This level has progressed. C4-C5: Small central disc protrusion (series 9, image 20) appears stable. No stenosis. C5-C6:  Negative. C6-C7: Mild rightward disc bulge and endplate spurring. Stable mild right C7 foraminal stenosis. C7-T1: Stable mild rightward disc bulging and endplate  spurring. Stable mild right C8 foraminal stenosis. Negative visible upper thoracic levels. IMPRESSION: 1. A small central disc herniation at C3-C4 appears progressed since last year along with mild spinal stenosis and mild degenerative cord mass effect. No spinal cord signal abnormality. Mild right C4 foraminal stenosis. 2. Stable mild cervical spine degeneration elsewhere. Electronically Signed   By: Genevie Ann M.D.   On: 04/29/2021 11:03   ECHOCARDIOGRAM COMPLETE  Result Date: 04/29/2021    ECHOCARDIOGRAM REPORT   Patient Name:   Christopher Roberson Date of Exam: 04/29/2021 Medical Rec #:  [id number]      Height:       68.0 in Accession #:    [id number]     Weight:       289.0 lb Date of Birth:  March 05, 1976      BSA:          2.390 m Patient Age:    78 years       BP:           168/93 mmHg Patient Gender: M              HR:           58 bpm. Exam Location:  Inpatient Procedure: 2D Echo Indications:    Stroke  History:        Patient has no prior history of Echocardiogram examinations.  Sonographer:    SP Referring Phys: [place]  1. Left ventricular ejection fraction, by estimation, is 50 to 55%. The left ventricle has low normal function. The left ventricle has no regional wall motion abnormalities. There is moderate left ventricular hypertrophy. Left ventricular diastolic parameters were normal.  2. Right ventricular systolic function is normal. The right ventricular size is normal.  3. The mitral valve  is normal in structure. Trivial mitral valve regurgitation.  4. The aortic valve is calcified. Aortic valve regurgitation is trivial. Aortic valve sclerosis is present, with no evidence of aortic valve stenosis. FINDINGS  Left Ventricle: Left ventricular ejection fraction, by estimation, is 50 to 55%. The left ventricle has low normal function. The left ventricle has no regional wall motion abnormalities. The left ventricular internal cavity size was normal in size. There is moderate left  ventricular hypertrophy. Left ventricular diastolic parameters were normal. Right Ventricle: The right ventricular size is normal. Right vetricular wall thickness was not assessed. Right ventricular systolic function is normal. Left Atrium: Left atrial size was normal in size. Right Atrium: Right atrial size was normal in size. Pericardium: Trivial pericardial effusion is present. Mitral Valve: The mitral valve is normal in structure. Trivial mitral valve regurgitation. Tricuspid Valve: The tricuspid valve is normal in structure. Tricuspid valve regurgitation is trivial. Aortic Valve: The aortic valve is calcified. Aortic valve regurgitation is trivial. Aortic valve sclerosis is present, with no evidence of aortic valve stenosis. Aortic valve mean gradient measures 3.0 mmHg. Aortic valve peak gradient measures 5.5 mmHg. Aortic valve area, by VTI measures 3.53 cm. Pulmonic Valve: The pulmonic valve was normal in structure. Pulmonic valve regurgitation is not visualized. Aorta: The aortic root and ascending aorta are structurally normal, with no evidence of dilitation. IAS/Shunts: No atrial level shunt detected by color flow Doppler.  LEFT VENTRICLE PLAX 2D LVIDd:         4.70 cm   Diastology LVIDs:         3.60 cm   LV e' medial:    5.33 cm/s LV PW:         1.50 cm   LV E/e' medial:  13.9 LV IVS:        1.50 cm   LV e' lateral:   5.87 cm/s LVOT diam:     2.40 cm   LV E/e' lateral: 12.6 LV SV:         82 LV SV Index:   34 LVOT Area:     4.52 cm  RIGHT VENTRICLE             IVC RV Basal diam:  3.40 cm     IVC diam: 2.00 cm RV S prime:     17.60 cm/s TAPSE (M-mode): 2.6 cm LEFT ATRIUM             Index        RIGHT ATRIUM           Index LA diam:        4.10 cm 1.72 cm/m   RA Area:     15.70 cm LA Vol (A2C):   54.8 ml 22.93 ml/m  RA Volume:   32.60 ml  13.64 ml/m LA Vol (A4C):   70.5 ml 29.50 ml/m LA Biplane Vol: 68.3 ml 28.58 ml/m  AORTIC VALVE AV Area (Vmax):    3.55 cm AV Area (Vmean):   3.24 cm AV Area  (VTI):     3.53 cm AV Vmax:           117.00 cm/s AV Vmean:          77.600 cm/s AV VTI:            0.233 m AV Peak Grad:      5.5 mmHg AV Mean Grad:      3.0 mmHg LVOT Vmax:         91.70 cm/s LVOT  Vmean:        55.600 cm/s LVOT VTI:          0.182 m LVOT/AV VTI ratio: 0.78  AORTA Ao Root diam: 3.60 cm Ao Asc diam:  3.40 cm MITRAL VALVE MV Area (PHT): 4.49 cm    SHUNTS MV Decel Time: 169 msec    Systemic VTI:  0.18 m MV E velocity: 74.10 cm/s  Systemic Diam: 2.40 cm MV A velocity: 58.10 cm/s MV E/A ratio:  1.28 [name] MD Electronically signed by [name] MD Signature Date/Time: 04/29/2021/5:35:53 PM    Final     DISCHARGE EXAMINATION: Vitals:   04/30/21 0018 04/30/21 0353 04/30/21 0842 04/30/21 1158  BP: (!) 160/98 (!) 142/78 137/79 (!) 174/101  Pulse: 98 87 85 67  Resp: (!) 22 20 18 18   Temp: 98.8 F (37.1 C)  98.5 F (36.9 C) 98 F (36.7 C)  TempSrc:   Oral Oral  SpO2: 96% 100% 98% 94%  Weight:      Height:        General appearance: Awake alert.  In no distress Resp: Clear to auscultation bilaterally.  Normal effort Cardio: S1-S2 is normal regular.  No S3-S4.  No rubs murmurs or bruit GI: Abdomen is soft.  Nontender nondistended.  Bowel sounds are present normal.  No masses organomegaly    DISPOSITION: Home  Discharge Instructions     Ambulatory referral to Neurology   Complete by: As directed    An appointment is requested in approximately: 8 weeks   Ambulatory referral to Occupational Therapy   Complete by: As directed    Ambulatory referral to Physical Therapy   Complete by: As directed    Call MD for:  difficulty breathing, headache or visual disturbances   Complete by: As directed    Call MD for:  extreme fatigue   Complete by: As directed    Call MD for:  persistant dizziness or light-headedness   Complete by: As directed    Call MD for:  persistant nausea and vomiting   Complete by: As directed    Call MD for:  severe uncontrolled pain   Complete by: As  directed    Call MD for:  temperature >100.4   Complete by: As directed    Diet Carb Modified   Complete by: As directed    Discharge instructions   Complete by: As directed    Please be sure to follow-up with your primary care provider in 1 week.  A referral has been sent to the neurologist for follow-up in about 2 months.  Take your medications as prescribed.  Monitor your glucose levels closely and follow-up with your endocrinologist/pcp.  You were cared for by a hospitalist during your hospital stay. If you have any questions about your discharge medications or the care you received while you were in the hospital after you are discharged, you can call the unit and asked to speak with the hospitalist on call if the hospitalist that took care of you is not available. Once you are discharged, your primary care physician will handle any further medical issues. Please note that NO REFILLS for any discharge medications will be authorized once you are discharged, as it is imperative that you return to your primary care physician (or establish a relationship with a primary care physician if you do not have one) for your aftercare needs so that they can reassess your need for medications and monitor your lab values. If you do not  have a primary care physician, you can call [phone] for a physician referral.   Increase activity slowly   Complete by: As directed          Allergies as of 04/30/2021       Reactions   Percocet [oxycodone-acetaminophen] Itching   Can take if takes with Benadryl        Medication List     STOP taking these medications    carvedilol 25 MG tablet Commonly known as: COREG   insulin glargine 100 UNIT/ML injection Commonly known as: LANTUS Replaced by: Lantus SoloStar 100 UNIT/ML Solostar Pen       TAKE these medications    albuterol 108 (90 Base) MCG/ACT inhaler Commonly known as: VENTOLIN HFA Inhale 2 puffs into the lungs every 4 (four) hours as needed  for wheezing or shortness of breath.   amLODipine 5 MG tablet Commonly known as: NORVASC Take 5 mg by mouth daily.   aspirin EC 81 MG tablet Take 1 tablet (81 mg total) by mouth every morning for 21 days.   atorvastatin 80 MG tablet Commonly known as: LIPITOR Take 1 tablet (80 mg total) by mouth every morning.   blood glucose meter kit and supplies Kit Dispense based on patient and insurance preference. Use up to four times daily as directed.   chlorthalidone 25 MG tablet Commonly known as: HYGROTON Take 25 mg by mouth daily.   clopidogrel 75 MG tablet Commonly known as: PLAVIX Take 1 tablet (75 mg total) by mouth daily.   cyclobenzaprine 5 MG tablet Commonly known as: FLEXERIL Take 5 mg by mouth 3 (three) times daily as needed for muscle spasms.   dapagliflozin propanediol 5 MG Tabs tablet Commonly known as: FARXIGA Take 5 mg by mouth daily.   Insulin Pen Needle 32G X 4 MM Misc Use as directed   Lantus SoloStar 100 UNIT/ML Solostar Pen Generic drug: insulin glargine Take 30units every morning and 45units every night Replaces: insulin glargine 100 UNIT/ML injection   lisinopril 40 MG tablet Commonly known as: ZESTRIL Take 1 tablet (40 mg total) by mouth every morning. Resume in  1 week What changed:  when to take this additional instructions   metFORMIN 1000 MG tablet Commonly known as: GLUCOPHAGE Take 1 tablet (1,000 mg total) by mouth 2 (two) times daily with a meal. What changed: when to take this   metoprolol succinate 50 MG 24 hr tablet Commonly known as: TOPROL-XL Take 1 tablet (50 mg total) by mouth daily.   olmesartan 20 MG tablet Commonly known as: BENICAR Take 20 mg by mouth daily.   Ozempic (0.25 or 0.5 MG/DOSE) 2 MG/1.5ML Sopn Generic drug: Semaglutide(0.25 or 0.5MG/DOS) Inject 0.25 mg into the skin every Monday.   pregabalin 150 MG capsule Commonly known as: LYRICA Take 150 mg by mouth 2 (two) times daily as needed (pain).   sertraline  50 MG tablet Commonly known as: ZOLOFT Take 50 mg by mouth daily.   spironolactone 25 MG tablet Commonly known as: ALDACTONE Take 25 mg by mouth daily.   terazosin 5 MG capsule Commonly known as: HYTRIN Take 5 mg by mouth at bedtime.   traZODone 100 MG tablet Commonly known as: DESYREL Take 100-300 mg by mouth at bedtime as needed for sleep.          Follow-up Information     [place]. Schedule an appointment as soon as possible for a visit in 1 week(s).   Specialty: Rehabilitation Contact information: [phone]  [facility] [place]        [name], FNP. Schedule an appointment as soon as possible for a visit in 1 week(s).   Specialty: Internal Medicine Contact information: [address] [place] [address] [phone]                 TOTAL DISCHARGE TIME: 53 minutes  [place]  [facility] Pager on [email or web address]  05/01/2021, 11:22 AM

## 2021-04-30 NOTE — Progress Notes (Signed)
Inpatient Diabetes Program Recommendations  AACE/ADA: New Consensus Statement on Inpatient Glycemic Control   Target Ranges:  Prepandial:   less than 140 mg/dL      Peak postprandial:   less than 180 mg/dL (1-2 hours)      Critically ill patients:  140 - 180 mg/dL    Latest Reference Range & Units 04/29/21 06:21 04/29/21 12:18 04/29/21 16:44 04/29/21 21:23 04/30/21 06:02  Glucose-Capillary 70 - 99 mg/dL 297 (H) 233 (H) 130 (H) 304 (H) 304 (H)   Review of Glycemic Control  Diabetes history: DM2 Outpatient Diabetes medications: Lantus 20 units QAM, Lantus 40 units QPM, Metformin 1000 mg BID, Farxiga 5 mg daily, Ozempic 0.50 mg Qweek Current orders for Inpatient glycemic control: Semglee 20 units QAM, Semglee 40 units QHS, Novolog 0-20 units TID with meals, Novolog 10 units TID with meals   Inpatient Diabetes Program Recommendations:    Insulin: Please consider increasing Semglee to 30 units QAM and Semglee 45 units QHS.  Outpatient DM needs: Patient t states that he is almost out of Metformin, Lantus pens, and insulin pen needles. He also reports that he moved from [facility] to [place] and has lost his glucometer and testing supplies. At time of discharge please provide Rx for: glucose monitoring kit (#[id number]), Metformin, Lantus SoloStar pens (#[id number]), insulin pen needles [phone]). Patient feels he needs to start taking Novolog meal coverage again since glucose is consistently in the 200-300's mg/dl after eating. May want to consider providing Rx for Novolog Flexpens [id number]) for meal coverage if appropriate.  Thanks, Barnie Alderman, RN, MSN, CDE Diabetes Coordinator Inpatient Diabetes Program [phone] (Team Pager from 8am to 5pm)

## 2021-04-30 NOTE — Progress Notes (Signed)
Christopher Roberson  D/C'd Home per MD order.  Discussed with the patient and all questions fully answered.  VSS, Skin clean, dry and intact without evidence of skin break down, no evidence of skin tears noted. IV catheter discontinued intact. Site without signs and symptoms of complications. Dressing and pressure applied.  An After Visit Summary was printed and given to the patient. Patient received prescription.  D/c education completed with patient/family including follow up instructions, medication list, d/c activities limitations if indicated, with other d/c instructions as indicated by MD - patient able to verbalize understanding, all questions fully answered.   Patient instructed to return to ED, call 911, or call MD for any changes in condition.   Patient escorted via [place], and D/C home via private auto.  [name] 04/30/2021 4:58 PM

## 2021-04-30 NOTE — Progress Notes (Addendum)
STROKE TEAM PROGRESS NOTE   INTERVAL HISTORY Patient is seen and assessed at the bedside.  Patient reports feeling continued RUE weakness and discomfort. Patient reports no improvement in symptoms since yesterday. Patient having been taking baby aspirin at home as antiplatelet therapy and has been compliant with prescribed medications. Patient wears CPAP at home and is compliant with wearing it. Patient reports having hx of stroke in 202 where he made a full recovery. MRI scan of the brain shows tiny left corona radiata subacute lacunar infarct.  CT angiogram of the brain and neck did not show any large vessel stenosis or occlusion.  LDL cholesterol is elevated at 1 1 5  mg percent and hemoglobin A1c at 10.8.  Echocardiogram shows normal ejection fraction without cardiac source of embolism.  Urine drug screen is negative. Discussed plan with patient to be on both Plavix and ASA for 3 weeks then Plavix indefinitely. Patient reports previously being on Plavix and denies having any problems while on Plavix. Strongly encouraged patient to work with outpatient PCP to get better control of diabetes and elevated cholesterol as well as appropriate lifestyle modifications. Patient verbalized agreement with plan and had no other questions.  Vitals:   04/30/21 0018 04/30/21 0353 04/30/21 0842 04/30/21 1158  BP: (!) 160/98 (!) 142/78 137/79 (!) 174/101  Pulse: 98 87 85 67  Resp: (!) 22 20 18 18   Temp: 98.8 F (37.1 C)  98.5 F (36.9 C) 98 F (36.7 C)  TempSrc:   Oral Oral  SpO2: 96% 100% 98% 94%  Weight:      Height:       CBC:  Recent Labs  Lab 04/28/21 1529 04/30/21 0023  WBC 7.4 7.2  HGB 14.5 13.3  HCT 41.1 38.3*  MCV 86.7 88.2  PLT 259 176   Basic Metabolic Panel:  Recent Labs  Lab 04/28/21 1529 04/30/21 0023  NA 134* 134*  K 3.7 3.6  CL 100 103  CO2 23 25  GLUCOSE 342* 284*  BUN 8 11  CREATININE 0.84 0.94  CALCIUM 9.0 8.6*   Lipid Panel:  Recent Labs  Lab 04/30/21 0023  CHOL  191  TRIG 106  HDL 55  CHOLHDL 3.5  VLDL 21  LDLCALC 115*   HgbA1c:  Recent Labs  Lab 04/30/21 0023  HGBA1C 10.8*   Urine Drug Screen:  Recent Labs  Lab 04/29/21 0005  LABOPIA NONE DETECTED  COCAINSCRNUR NONE DETECTED  LABBENZ NONE DETECTED  AMPHETMU NONE DETECTED  THCU NONE DETECTED  LABBARB NONE DETECTED    Alcohol Level  Recent Labs  Lab 04/28/21 2033  ETH <10    IMAGING past 24 hours No results found.  PHYSICAL EXAM  Temp:  [98 F (36.7 C)-99 F (37.2 C)] 98 F (36.7 C) (01/06 1158) Pulse Rate:  [57-109] 67 (01/06 1158) Resp:  [18-22] 18 (01/06 1158) BP: (137-175)/(77-109) 174/101 (01/06 1158) SpO2:  [94 %-100 %] 94 % (01/06 1158)  General - Well nourished, obese, middle-aged African-American male in no apparent distress.  Cardiovascular - Regular rhythm and rate.  Mental Status -  Level of arousal and orientation to time, place, and person were intact. Language including expression, naming, repetition, comprehension was assessed and found intact. Attention span and concentration were normal. Recent and remote memory were intact. Fund of Knowledge was assessed and was intact.  Cranial Nerves II - XII - II - Visual field intact. III, IV, VI - Extraocular movements intact. V - Facial sensation intact bilaterally but reports lighter  sensation on right side. VII - Facial movement intact bilaterally. Mild left facial droop. VIII - Hearing & vestibular intact bilaterally. X - Palate elevates symmetrically. XI - Chin turning & shoulder shrug intact bilaterally. XII - Tongue protrusion intact.  Motor Strength - The patients strength was normal in all extremities. Mild weakness noted on RUE and RLE.  Bulk was normal and fasciculations were absent.   Motor Tone - Muscle tone was assessed at the neck and appendages and was normal.  Reflexes - The patients reflexes were symmetrical in all extremities and he had no pathological reflexes.  Sensory -  Light touch, temperature/pinprick were assessed and were symmetrical.    Coordination - The patient had normal movements in the hands and feet with no ataxia or dysmetria.  Tremor was absent.  Gait and Station - deferred.  ASSESSMENT/PLAN Christopher Roberson is a 46 y.o. male with history of T2DM, HTN, OSA presenting with R arm numbness and L facial droop.   Stroke:   Code Stroke CT head No acute abnormality.  CTA head & neck: No LVO or other vascular abnormalities MRI  small subacute white matter lacunar infarct in left corona radiata. No associated hemorrhage or mass effect 2D Echo EF 50-55% LDL 115 HgbA1c 10.8 VTE prophylaxis - Lovenox injection    Diet   Diet Carb Modified Fluid consistency: Thin; Room service appropriate? Yes   aspirin 81 mg daily prior to admission, now on aspirin 81 mg daily and clopidogrel 75 mg daily.  Therapy recommendations:  Continue Plavix and ASA x3 weeks and continue Plavix indefinitely. Follow up outpatient neurology in 2 months Disposition:  pending  Hypertension Home meds:  Coreg, norvasc, lisinopril, spironolactone, metoprolol, benicar Unstable Permissive hypertension (OK if < 220/120) but gradually normalize in 5-7 days Long-term BP goal normotensive  Hyperlipidemia Home meds:  ozempic, lipitor, resumed in hospital LDL 115, goal < 70  Continue statin at discharge  Diabetes type II Uncontrolled Home meds:  metformin, farxiga, lantus, novolog HgbA1c 10. 5 nitrates infarct from small vessel disease., goal < 7.0 CBGs Recent Labs    04/29/21 2123 04/30/21 0602 04/30/21 1144  GLUCAP 304* 304* 235*    SSI  Other Stroke Risk Factors Obesity, Body mass index is 43.95 kg/m., BMI >/= 30 associated with increased stroke risk, recommend weight loss, diet and exercise as appropriate  Hx stroke/TIA Obstructive sleep apnea, on CPAP at home  Other Active Problems R shoulder pain: PRN toradol for now  Hospital day # 1  France Ravens, MD PGY1  Resident STROKE MD NOTE :  I have personally obtained history,examined this patient, reviewed notes, independently viewed imaging studies, participated in medical decision making and plan of care.ROS completed by me personally and pertinent positives fully documented  I have made any additions or clarifications directly to the above note. Agree with note above.  Patient presented with right-sided numbness and tingling secondary to small left subcortical lacunar infarct from small vessel disease.  Recommend aspirin Plavix for 3 weeks followed by Plavix alone and aggressive risk factor modification.  Patient counseled to be compliant with using the CPAP every night.  Continue ongoing stroke work-up.  Patient was counseled to eat a healthy diet with lots of fruits, Lick[place], cereals and whole grains and to be active and exercise regularly and lose weight.  He was counseled not to smoke.  Discussed with Dr. [place] Pink.  Greater than 50% time during this 50-minute visit was spent in counseling and coordination of care with development  of stroke and discussion about stroke evaluation, treatment and prevention and answering questions.  Follow-up as an outpatient stroke clinic in 2 months.  Stroke team will sign off.  Kindly call for questions.  Antony Contras, MD Medical Director [facility] Pager: [phone] 04/30/2021 4:10 PM  To contact Stroke Continuity provider, please refer to [email or web address] After hours, contact General Neurology

## 2021-04-30 NOTE — Progress Notes (Signed)
SLP Cancellation Note  Patient Details Name: Christopher Roberson MRN: [id number] DOB: 1975/04/26   Cancelled treatment:       Reason Eval/Treat Not Completed:  (Pt already discharged and left hospital.)  [name] I. Hardin Negus, [place], [place] Office number [phone] Pager [place] 04/30/2021, 4:05 PM

## 2021-04-30 NOTE — Plan of Care (Signed)
°  Problem: Education: Goal: Knowledge of disease or condition will improve Outcome: Progressing Goal: Knowledge of secondary prevention will improve (SELECT ALL) Outcome: Progressing   Problem: Ischemic Stroke/TIA Tissue Perfusion: Goal: Complications of ischemic stroke/TIA will be minimized Outcome: Progressing

## 2021-04-30 NOTE — TOC Transition Note (Signed)
Transition of Care [facility]) - CM/SW Discharge Note   Patient Details  Name: Christopher Roberson MRN: [id number] Date of Birth: 09-Feb-1976  Transition of [facility]) CM/SW Contact:  Pollie Friar, RN Phone Number: 04/30/2021, 1:41 PM   Clinical Narrative:    Patient is discharging home with outpatient therapy. Information on the AVS.  Pt has needed DME at home.  Pt has transportation home.    Final next level of care: OP Rehab Barriers to Discharge: No Barriers Identified   Patient Goals and CMS Choice     Choice offered to / list presented to : Patient  Discharge Placement                       Discharge Plan and Services   Discharge Planning Services: CM Consult                                 Social Determinants of Health (SDOH) Interventions     Readmission Risk Interventions No flowsheet data found.

## 2021-04-30 NOTE — Plan of Care (Signed)
°  Problem: Education: Goal: Knowledge of disease or condition will improve Outcome: Adequate for Discharge Goal: Knowledge of secondary prevention will improve (SELECT ALL) Outcome: Adequate for Discharge Goal: Knowledge of patient specific risk factors will improve (INDIVIDUALIZE FOR PATIENT) Outcome: Adequate for Discharge Goal: Individualized Educational Video(s) Outcome: Adequate for Discharge   Problem: Intracerebral Hemorrhage Tissue Perfusion: Goal: Complications of Intracerebral Hemorrhage will be minimized Outcome: Adequate for Discharge   Problem: Ischemic Stroke/TIA Tissue Perfusion: Goal: Complications of ischemic stroke/TIA will be minimized Outcome: Adequate for Discharge

## 2021-05-04 ENCOUNTER — Ambulatory Visit: Payer: Medicare Other | Attending: Internal Medicine

## 2021-05-04 ENCOUNTER — Ambulatory Visit: Payer: Medicare Other | Admitting: Occupational Therapy

## 2021-05-05 NOTE — Patient Outreach (Signed)
Received a red flag Emmi stroke notification for Christopher Roberson.  I have assigned Joellyn Quails, RN to call for follow up and determine if there are any Case Management needs.    [place] Care, [place], [place] Management [phone]

## 2021-05-06 ENCOUNTER — Other Ambulatory Visit: Payer: Self-pay | Admitting: *Deleted

## 2021-05-06 NOTE — Patient Outreach (Addendum)
[facility]) Care Management  05/06/2021  Christopher Roberson May 05, 1975 [id number]   [facility] Unsuccessful outreach  referred to [facility] on 05/05/21 for EMMI stroke for feeling worse overall and new or worsening pain/fever/shortness of breath? yes response from [facility] outreach on Tuesday 05/04/21 1001 Hospitalized at [place] [facility] 1/5-6/23 for acute cva/chronic cerebella stroke  Outreach attempt to the listed at the preferred outreach number in [place] No answer. THN RN CM left HIPAA [facility] Portability and Accountability Act) compliant voicemail message along with CMs contact info.   Plan: [facility] RN CM scheduled this patient for another call attempt within 4-7 business days Unsuccessful outreach letter sent on 05/06/21 Unsuccessful outreach on 05/06/21   Joelene Millin L. Lavina Hamman, RN, BSN, [place] Coordinator Office number [phone] Mobile number [phone]  Main THN number [phone] Fax number [phone]

## 2021-05-11 NOTE — Therapy (Deleted)
[facility] [address] [place], [place], [address] Phone: [phone]   Fax:  [phone]  Patient Details  Name: Christopher Roberson MRN: [id number] Date of Birth: 01-04-1976 Referring Provider:  [name], *  Encounter Date: 05/12/2021   OUTPATIENT PHYSICAL THERAPY NEURO EVALUATION   Patient Name: Christopher Roberson MRN: [id number] DOB:1975-07-22, 46 y.o., male Today's Date: 05/11/2021  PCP: [name], FNP REFERRING PROVIDER: [name], *    Past Medical History:  Diagnosis Date   Diabetes mellitus    Hypertension    Stroke [facility])    Past Surgical History:  Procedure Laterality Date   APPENDECTOMY     CARPAL TUNNEL RELEASE     HERNIA REPAIR     Patient Active Problem List   Diagnosis Date Noted   HLD (hyperlipidemia) 04/29/2021   Acute CVA (cerebrovascular accident) ([place]) 04/29/2021   Compression of right ulnar nerve at multiple levels 07/30/2020   Paresthesias 05/22/2020   Carpal tunnel syndrome of right wrist 02/26/2020   Hypomagnesemia 03/04/2019   Ulnar neuropathy of right upper extremity 03/04/2019   Ulnar nerve compression, left 02/11/2019   Status post placement of implantable loop recorder 12/07/2018   Degenerative cervical disc 11/04/2018   COVID-19 virus infection    Suspected COVID-19 virus infection 10/09/2018   Viral gastroenteritis 10/09/2018   Acute respiratory failure with hypoxia ([place]) 10/08/2018   Medication management 08/27/2018   Cerebellar cerebrovascular accident (CVA) without late effect 05/09/2018   Bell's palsy 05/09/2018   Current use of insulin ([place]) 03/19/2018   Dry eye syndrome of both lacrimal glands 03/19/2018   Vitreous floaters of both eyes 03/19/2018   Premature ejaculation, acquired, generalized, moderate 02/26/2018   Gross hematuria 02/19/2018   Moderate episode of recurrent major depressive disorder ([place]) 02/06/2018   Grief  02/05/2018   SI (sacroiliac) pain 11/30/2017   GAD (generalized anxiety disorder) 07/20/2017   Severe nonproliferative diabetic retinopathy of both eyes without macular edema associated with type 2 diabetes mellitus ([place]) 07/20/2017   Thoracic radiculopathy 02/21/2017   Chronic right-sided low back pain with right-sided sciatica 02/16/2017   Alcohol abuse 11/01/2016   Intentional drug overdose ([place]) 10/31/2016   Severe episode of recurrent major depressive disorder, without psychotic features ([place]) 10/31/2016   Chronic pain of right knee 03/24/2016   Palpitations 08/17/2015   Vitamin D deficiency 06/24/2015   Adult situational stress disorder 06/10/2015   Recurrent depression ([place]) 73/71/0626   Umbilical hernia 94/85/4627   Acute pancreatitis 06/02/2015   Personality disorder ([place]) 04/03/2015   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  SUBJECTIVE:   SUBJECTIVE STATEMENT: ***  PERTINENT HISTORY: ***  PAIN:  Are you having pain? {yes/no:20286} NPRS scale: ***/10 Pain location: *** Pain orientation: {Pain Orientation:25161}  PAIN TYPE: {type:313116} Pain description: {PAIN DESCRIPTION:21022940}  Aggravating factors: *** Relieving factors: ***  PRECAUTIONS: {Therapy precautions:24002}  WEIGHT BEARING RESTRICTIONS {Yes ***/No:24003}  FALLS: Has patient fallen in last 6  months? {yes/no:20286}, Number of falls: ***  LIVING ENVIRONMENT: Lives with: {[facility] lives with:25569::"lives with their family"} Lives in: {Lives in:25570} Stairs: {yes/no:20286}; {Stairs:24000} Has following equipment at home: {Assistive devices:23999}  PLOF: {PLOF:24004}  PATIENT GOALS ***  OBJECTIVE:   DIAGNOSTIC FINDINGS: ***  COGNITION: Overall cognitive status: {cognition:24006} Areas of impairment: {cognitive impairment:24009} Commands: {commands:24018} Attention: {intact/deficits:24005} Memory: {intact/deficits:24005} Awareness: {intact/deficits:24005} Problem solving: {intact/deficits:24005} Executive function:{Executive functioning:24008} Behavior: {behavior:24019}   SENSATION: Light touch: {intact/deficits:24005} Stereognosis: {intact/deficits:24005} Hot/Cold: {intact/deficits:24005} Proprioception: {intact/deficits:24005}  EDEMA:  {edema:24020}  MUSCLE TONE: {LE tone:25568}   MUSCLE LENGTH: Hamstrings: Right *** deg; Left *** deg Thomas test: Right *** deg; Left *** deg  DTRs:  {DTR SITE:24025}  POSTURE: {posture:25561}  AROM/PROM:  A/PROM Right 05/11/2021 Left 05/11/2021  Hip flexion    Hip abduction    Hip adduction    Hip internal rotation    Hip external rotation    Knee flexion    Knee extension    Ankle dorsiflexion    Ankle plantarflexion    Ankle inversion    Ankle eversion     (Blank rows = not tested) MMT:  MMT Right 05/11/2021 Left 05/11/2021  Hip flexion    Hip abduction    Hip adduction    Hip internal rotation    Hip external rotation    Knee flexion    Knee extension    Ankle dorsiflexion    Ankle plantarflexion    Ankle inversion    Ankle eversion    (Blank rows = not tested)  BED MOBILITY:  {Bed mobility:24027}  TRANSFERS: Assistive device utilized: {Assistive devices:23999}  Sit to stand: {Levels of assistance:24026} Stand to sit: {Levels of assistance:24026} Chair to chair: {Levels of  assistance:24026} Floor: {Levels of assistance:24026}  RAMP {Levels of assistance:24026}  CURB: {Levels of assistance:24026}  GAIT: Gait pattern: {gait characteristics:25376} Distance walked: *** Assistive device utilized: {Assistive devices:23999} Level of assistance: {Levels of assistance:24026} Comments: ***  FUNCTIONAL TESTs:  {Functional tests:24029}  PATIENT SURVEYS:  {rehab surveys:24030}  TODAY'S TREATMENT:  ***   PATIENT EDUCATION: Education details: *** Person educated: {Person educated:25204} Education method: {Education Method:25205} Education comprehension: {Education Comprehension:25206}   HOME EXERCISE PROGRAM: ***  ASSESSMENT:  CLINICAL IMPRESSION: Patient is a *** y.o. *** who was seen today for physical therapy evaluation and treatment for ***. Objective impairments include {opptimpairments:25111}. These impairments are limiting patient from {activity limitations:25113}. Personal factors including {Personal factors:25162} are also affecting patient's functional outcome. Patient will benefit from skilled PT to address above impairments and improve overall function.  REHAB POTENTIAL: {rehabpotential:25112}  CLINICAL DECISION MAKING: {clinical decision making:25114}  EVALUATION COMPLEXITY: {Evaluation complexity:25115}   GOALS: Goals reviewed with patient? {yes/no:20286}  SHORT TERM GOALS:  STG Name Target Date Goal status  1 *** Baseline:  06/15/2021 {GOALSTATUS:25110}  2 *** Baseline:  {follow up:25551} {GOALSTATUS:25110}  3 *** Baseline: {follow up:25551} {GOALSTATUS:25110}  4 *** Baseline: {follow up:25551} {GOALSTATUS:25110}  5 *** Baseline: {follow up:25551} {GOALSTATUS:25110}  6 *** Baseline: {follow up:25551} {GOALSTATUS:25110}  7 *** Baseline: {follow up:25551} {GOALSTATUS:25110}   LONG TERM GOALS:   LTG Name Target Date Goal status  1 *** Baseline: {follow up:25551} {GOALSTATUS:25110}  2 *** Baseline: {follow up:25551}  {  GOALSTATUS:25110}  3 *** Baseline: {follow up:25551} {GOALSTATUS:25110}  4 *** Baseline: {follow up:25551} {GOALSTATUS:25110}  5 *** Baseline: {follow up:25551} {GOALSTATUS:25110}  6 *** Baseline: {follow up:25551} {GOALSTATUS:25110}  7 *** Baseline: {follow up:25551} {GOALSTATUS:25110}   PLAN: PT FREQUENCY: {rehab frequency:25116}  PT DURATION: {rehab duration:25117}  PLANNED INTERVENTIONS: {rehab planned interventions:25118::"Therapeutic exercises","Therapeutic activity","Neuro Muscular re-education","Balance training","Gait training","Patient/Family education","Joint mobilization"}  PLAN FOR NEXT SESSION: Kerrie Pleasure, PT 05/11/2021, 3:06 PM  [facility] [address] [place], [place], [address] Phone: [phone]   Fax:  [phone]

## 2021-05-12 ENCOUNTER — Encounter: Payer: Medicare Other | Admitting: Occupational Therapy

## 2021-05-12 ENCOUNTER — Ambulatory Visit: Payer: Medicare Other

## 2021-05-12 ENCOUNTER — Other Ambulatory Visit: Payer: Self-pay | Admitting: *Deleted

## 2021-05-12 NOTE — Patient Outreach (Signed)
[facility]) Care Management  05/12/2021  Jaren Vanetten 14-Mar-1976 [id number]   [facility] Unsuccessful outreach #2   referred to [facility] on 05/05/21 for EMMI stroke for feeling worse overall and new or worsening pain/fever/shortness of breath? yes response from [facility] outreach on Tuesday 05/04/21 1001 Hospitalized at [place] [facility] 1/5-6/23 for acute cva/chronic cerebella stroke   Outreach attempt to the listed at the preferred outreach number in [place] silent pause then male answered and RN CM greeted, then a long silent pause Questionable poor phone connection No answer on second attempt to [phone] and unable to leave a voice message Outreach to wife Deirdre listed number [phone] No answer THN RN CM left HIPAA ([place]) compliant voicemail message along with CMs contact info.    Plan: [facility] RN CM scheduled this patient for another call attempt within 4-7+ business days Unsuccessful outreach letter sent on 05/06/21 Unsuccessful outreach on 05/06/21, 05/12/21     Joelene Millin L. Lavina Hamman, RN, BSN, [place] Coordinator Office number [phone] Mobile number [phone]  Main THN number [phone] Fax number [phone]

## 2021-05-19 ENCOUNTER — Other Ambulatory Visit: Payer: Self-pay

## 2021-05-19 ENCOUNTER — Other Ambulatory Visit: Payer: Self-pay | Admitting: *Deleted

## 2021-05-19 NOTE — Patient Outreach (Signed)
[facility]) Care Management Telephonic RN Care Manager Note   05/20/2021 Name:  Christopher Roberson MRN:  [id number] DOB:  [birth year]  Summary: Third follow up outreach for EMMI stroke red alert for feeling worse overall  and new or worsening pain/fever/shortness of breath? Yes response   Christopher Roberson reports worsening symptoms of right side weakness, having a hard time with mobility, right arm numbness, tingling and "feels like dead weight" , headache 132/90 this morning dizziness with standing/walking not with sitting, pain in neck radiates down right arm, for the last 3 days episodes in which saturates clothing with sweats when in pain, right hand contractures, pain level 8-9 level "has me in tears" No n/v  Has not been able to attend his outpatient therapies related to these issues He reports he gets relief if he lifts his arm straight in the air PMH or recent 04/29/21 acute CVA/chronic cerebella stroke, 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 levels, and right wrist carpal tunnel syndrome and ulnar nerve compression/neuropathy of left wrist    He is wearing his loop recorder He reports it has been checked and he does not recall being informed of any abnormalities  He reports he has seen his pcp, Dr Lacinda Axon (EPIC indicates 03/31/21 as last office visit date)  He is now living in [place] and would like to get local providers He checked with Dr Ermalene Postin neurologist in [place] and he is not able to be seen until may 2023  He checked for the next available visit to Dr Lacinda Axon and found a date for 06/04/21   Recommendations/Changes made from today's visit: See care plan below   Subjective: Christopher Roberson is an 46 y.o. year old male who is a primary patient of Christopher Roberson., FNP. The care management team was consulted for assistance with care management and/or care coordination needs.     referred to [facility] on 05/05/21 for EMMI stroke for feeling worse overall and  new or worsening pain/fever/shortness of breath? yes response from [facility] outreach on Tuesday 05/04/21 1001 Hospitalized at [place] [facility] 1/5[facility]6/23 for acute CVA/chronic cerebella stroke   Telephonic RN Care Manager completed Telephone Visit today.   Objective:  Medications Reviewed Today     Reviewed by Tollie Pizza, CPhT (Pharmacy Technician) on 04/29/21 at [id number]  Med List Status: Complete   Medication Order Taking? Sig Documenting Provider Last Dose Status Informant  albuterol (VENTOLIN HFA) 108 (90 Base) MCG/ACT inhaler [id number] Yes Inhale 2 puffs into the lungs every 4 (four) hours as needed for wheezing or shortness of breath. [provider] Past Month Active Self  amLODipine (NORVASC) 5 MG tablet [id number] Yes Take 5 mg by mouth daily. [provider] 04/28/2021 Active Self  aspirin EC 81 MG tablet [id number] Yes Take 81 mg by mouth every morning. [provider] 04/28/2021 Active Self  carvedilol (COREG) 25 MG tablet [id number] Yes Take 25 mg by mouth in the morning and at bedtime. [provider] 04/28/2021 0500 Active Self  chlorthalidone (HYGROTON) 25 MG tablet [id number] Yes Take 25 mg by mouth daily. [provider] 04/28/2021 Active Self  cyclobenzaprine (FLEXERIL) 5 MG tablet [id number] Yes Take 5 mg by mouth 3 (three) times daily as needed for muscle spasms. [provider] Past Week Active Self  dapagliflozin propanediol (FARXIGA) 5 MG TABS tablet [id number] Yes Take 5 mg by mouth daily. [provider] 04/28/2021 Active Self  insulin glargine (LANTUS) 100 UNIT/ML injection [id number] Yes Inject  20-40 Units into the skin See admin instructions. Take 20 units in the morning, and 40 units in the evening [provider] 04/28/2021 Active Self  lisinopril (ZESTRIL) 40 MG tablet [id number] Yes Take 1 tablet (40 mg total) by mouth every morning. Resume in  1 week  Patient taking differently: Take 40 mg by mouth daily.   [name],  [name], MD 04/28/2021 Active Self  metFORMIN (GLUCOPHAGE) 1000 MG tablet [id number] Yes Take 1,000 mg by mouth 2 (two) times a day. [provider] 04/28/2021 Active Self  metoprolol succinate (TOPROL-XL) 50 MG 24 hr tablet [id number] Yes Take 50 mg by mouth daily.  [provider] Past Week 0500 Active Self  olmesartan (BENICAR) 20 MG tablet [id number] Yes Take 20 mg by mouth daily. [provider] 04/28/2021 Active Self  pregabalin (LYRICA) 150 MG capsule [id number] Yes Take 150 mg by mouth 2 (two) times daily as needed (pain). [provider] Past Week Active Self  Semaglutide,0.25 or 0.5MG /DOS, (OZEMPIC, 0.25 OR 0.5 MG/DOSE,) 2 MG/1.5ML SOPN [id number] Yes Inject 0.25 mg into the skin every Monday. [provider] Past Week Active Self  sertraline (ZOLOFT) 50 MG tablet [id number] Yes Take 50 mg by mouth daily. [provider] 04/28/2021 Active Self  spironolactone (ALDACTONE) 25 MG tablet [id number] Yes Take 25 mg by mouth daily. [provider] 04/28/2021 Active Self  terazosin (HYTRIN) 5 MG capsule [id number] Yes Take 5 mg by mouth at bedtime. [provider] Past Week Active Self  traZODone (DESYREL) 100 MG tablet [id number] Yes Take 100-300 mg by mouth at bedtime as needed for sleep. [provider] Past Week Active Self           Past Medical History:  Diagnosis Date   Diabetes mellitus    Hypertension    Stroke [facility])    Patient Active Problem List   Diagnosis Date Noted   HLD (hyperlipidemia) 04/29/2021   Acute CVA (cerebrovascular accident) ([place]) 04/29/2021   Compression of right ulnar nerve at multiple levels 07/30/2020   Paresthesias 05/22/2020   Carpal tunnel syndrome of right wrist 02/26/2020   Hypomagnesemia 03/04/2019   Ulnar neuropathy of right upper extremity 03/04/2019   Ulnar nerve compression, left 02/11/2019   Status post placement of implantable loop recorder 12/07/2018   Degenerative cervical  disc 11/04/2018   COVID-19 virus infection    Suspected COVID-19 virus infection 10/09/2018   Viral gastroenteritis 10/09/2018   Acute respiratory failure with hypoxia ([place]) 10/08/2018   Medication management 08/27/2018   Cerebellar cerebrovascular accident (CVA) without late effect 05/09/2018   Bell's palsy 05/09/2018   Current use of insulin ([place]) 03/19/2018   Dry eye syndrome of both lacrimal glands 03/19/2018   Vitreous floaters of both eyes 03/19/2018   Premature ejaculation, acquired, generalized, moderate 02/26/2018   Gross hematuria 02/19/2018   Moderate episode of recurrent major depressive disorder ([place]) 02/06/2018   Grief 02/05/2018   SI (sacroiliac) pain 11/30/2017   GAD (generalized anxiety disorder) 07/20/2017   Severe nonproliferative diabetic retinopathy of both eyes without macular edema associated with type 2 diabetes mellitus ([place]) 07/20/2017   Thoracic radiculopathy 02/21/2017   Chronic right-sided low back pain with right-sided sciatica 02/16/2017   Alcohol abuse 11/01/2016   Intentional drug overdose ([place]) 10/31/2016   Severe episode of recurrent major depressive disorder, without psychotic features ([place]) 10/31/2016   Chronic pain of right knee 03/24/2016   Palpitations 08/17/2015   Vitamin D deficiency 06/24/2015   Adult situational stress disorder 06/10/2015  Recurrent depression ([place]) 15/17/6160   Umbilical hernia 73/71/0626   Acute pancreatitis 06/02/2015   Personality disorder ([place]) 04/03/2015   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 04/01/2015   Dyspnea on exertion 02/26/2015   Sleep apnea 02/26/2015   Type 2 diabetes mellitus with hyperglycemia, with long-term current use of insulin ([place]) 02/09/2015   HTN (hypertension) 02/09/2015   Adult BMI 45.0-49.9 kg/sq m ([place]) 02/09/2015   Morbid obesity ([place]) 02/09/2015   Chest pain 02/06/2015     SDOH:  (Social Determinants of Health) assessments and interventions performed:   SDOH Interventions    Flowsheet Row Most Recent Value  SDOH Interventions   Food Insecurity Interventions Intervention Not Indicated  Financial Strain Interventions Intervention Not Indicated  Housing Interventions Intervention Not Indicated  Intimate Partner Violence Interventions Intervention Not Indicated  Stress Interventions Intervention Not Indicated  Transportation Interventions Intervention Not Indicated       Care Plan  Review of patient past medical history, allergies, medications, health status, including review of consultants reports, laboratory and other test data, was performed as part of comprehensive evaluation for care management services.   Care Plan : RN Care Manager Plan of Care  Updates made by Barbaraann Faster, RN since 05/20/2021 12:00 AM     Problem: Complex Care Coordination Needs and disease management in patient with stroke   Priority: High  Onset Date: 05/19/2021     Goal: Establish Plan of Care for Management Complex SDOH Barriers, disease management and Care Coordination Needs in patient with   Start Date: 05/19/2021  This Visit's Progress: Not on track  Priority: High  Note:   Current Barriers:  Care Coordination needs related to Worsening symptoms for stroke  RN CM Clinical Goal(s):  Patient will  received care coordination for worsening stroke symptoms to prevent re admission  through collaboration with RN Care manager, provider, and care team.   Interventions: Outreach to patient, care coordination outreaches, education/disease management and resources to be provided prn  Inter-disciplinary care team collaboration (see longitudinal plan of care) Evaluation of current treatment plan related to  self management and patient's adherence to plan as established by provider   Stroke:  (Status:New goal.) Short Term Goal Discussed with him that with his medicare/medicaid coverage he would have a selection of available accepting local providers,  referred him to medicare.gov Frequently referred him to his After summary sheet/hospital discharge sheet Noted pt without a neurology follow up Outreach with patient to Neurology Pt prefers to switch from Dr Ermalene Postin Neurology to local neurology services Spoke with Jinny Blossom who states call attempts had been made unsuccessfully to patient. Next available Monday July 26 2021  9 am Dr Leonie Man and high priority on cancellation list. Attempt to transferred to Dr Leonie Man Nurse but patient referred to his pcp RN CM  Encouraged patient to outreach to pcp to follow up on pt previous outreach, to report symptoms and to inquire about possible orders for home health referral prn  Encouraged seeking emergency or urgent medical services if symptoms worsen Encouraged to have orthopedic MD, Dr Gerarda Fraction evaluate the symptoms on 05/21/21 Discussed similar symptoms of strokes, neuropathy, ulnar nerve compression and carpal tunnel Reviewed the signs of strokes Sent EMMI education to e-mail listed in EPIC - diabetes type 2, stroke and TIA overview, stroke" preventing recurrent stroke, carpal tunnel syndrome  Patient Goals/Self-Care Activities: Take all medications as prescribed Attend all scheduled provider appointments Call provider office for new concerns or questions  outreach to pcp,  neurology, orthopedic prn Outreach to 24 hour nurse call center number prn as provided by RN CM   Follow Up Plan:  The patient has been provided with contact information for the care management team and has been advised to call with any health related questions or concerns.  The care management team will reach out to the patient again over the next 30+ business  days.       Plan: The patient has been provided with contact information for the care management team and has been advised to call with any health related questions or concerns.  The care management team will reach out to the patient again over the next 30+ days.  Angelina Neece L. Lavina Hamman,  RN, BSN, [place] Coordinator Office number [phone] [facility] number [phone] Fax number [phone]

## 2021-05-20 ENCOUNTER — Encounter: Payer: Self-pay | Admitting: *Deleted

## 2021-05-22 DIAGNOSIS — R29898 Other symptoms and signs involving the musculoskeletal system: Secondary | ICD-10-CM | POA: Insufficient documentation

## 2021-05-24 ENCOUNTER — Other Ambulatory Visit: Payer: Self-pay

## 2021-05-24 ENCOUNTER — Other Ambulatory Visit: Payer: Self-pay | Admitting: *Deleted

## 2021-05-24 NOTE — Patient Outreach (Signed)
Christopher Roberson) Care Management Telephonic RN Care Manager Note   05/24/2021 Name:  Christopher Roberson MRN:  [id number] DOB:  1975-08-03  Summary: Follow outreach after orthopedic appointment for evaluation of right upper extremity pain Mr Citro confirms attending his appointment with Dr Gerarda Fraction, being examined after discussing his worsening symptoms and having an xray completed The xray reviewed in EPIC =negative Mr Canupp has reached out to Neurology as he continues to report pain of the right upper extremity  Recommendations/Changes made from today's visit: Assessed for follow up on recent orthopedic appointment/xray/treatment plan Commended him for his advocacy and outreach to neurology- He voiced appreciation EMMI sent via e-mail on neuropathic pain Sent patient lists of in network local [place] medicare and medicaid doctors for choice   Subjective: Christopher Roberson is an 46 y.o. year old male who is a primary patient of Christopher Roberson., FNP. The care management team was consulted for assistance with care management and/or care coordination needs.    Telephonic RN Care Manager completed Telephone Visit today.   Objective:  Medications Reviewed Today     Reviewed by Barbaraann Faster, RN (Registered Nurse) on 05/24/21 at [place] List Status: <None>   Medication Order Taking? Sig Documenting Provider Last Dose Status Informant  albuterol (VENTOLIN HFA) 108 (90 Base) MCG/ACT inhaler [id number] No Inhale 2 puffs into the lungs every 4 (four) hours as needed for wheezing or shortness of breath. [provider] Past Month Active Self  amLODipine (NORVASC) 5 MG tablet [id number] No Take 5 mg by mouth daily. [provider] 04/28/2021 Active Self  atorvastatin (LIPITOR) 80 MG tablet [id number]  Take 1 tablet (80 mg total) by mouth every morning. Christopher Haff, MD  Active   blood glucose meter kit and supplies KIT [id number]  Dispense based on patient  and insurance preference. Use up to four times daily as directed. Christopher Haff, MD  Active   carvedilol (COREG) 25 MG tablet [id number]  Take 25 mg by mouth 2 (two) times daily. [provider]  Active   chlorthalidone (HYGROTON) 25 MG tablet [id number] No Take 25 mg by mouth daily. [provider] 04/28/2021 Active Self  clopidogrel (PLAVIX) 75 MG tablet [id number]  Take 1 tablet (75 mg total) by mouth daily. Christopher Haff, MD  Active   cyclobenzaprine (FLEXERIL) 5 MG tablet [id number] No Take 5 mg by mouth 3 (three) times daily as needed for muscle spasms. [provider] Past Week Active Self  dapagliflozin propanediol (FARXIGA) 5 MG TABS tablet [id number] No Take 5 mg by mouth daily. [provider] 04/28/2021 Active Self  insulin glargine (LANTUS SOLOSTAR) 100 UNIT/ML Solostar Pen [id number]  Take 30units every morning and 45units every night Christopher Haff, MD  Active   Insulin Pen Needle 32G X 4 MM MISC [id number]  Use as directed Christopher Haff, MD  Active   lisinopril (ZESTRIL) 40 MG tablet [id number] No Take 1 tablet (40 mg total) by mouth every morning. Resume in  1 week  Patient taking differently: Take 40 mg by mouth daily.   [name], [name], MD 04/28/2021 Active Self  metFORMIN (GLUCOPHAGE) 1000 MG tablet [id number]  Take 1 tablet (1,000 mg total) by mouth 2 (two) times daily with a meal. Christopher Haff, MD  Active   metoprolol succinate (TOPROL-XL) 50 MG 24 hr tablet [id number]  Take 1 tablet (50 mg total) by mouth daily. Christopher Haff, MD  Active   olmesartan [facility]) 20 MG tablet [id number] No  Take 20 mg by mouth daily. [provider] 04/28/2021 Active Self  pregabalin (LYRICA) 150 MG capsule [id number] No Take 150 mg by mouth 2 (two) times daily as needed (pain). [provider] Past Week Active Self  Semaglutide,0.25 or 0.5MG /DOS, (OZEMPIC, 0.25 OR 0.5 MG/DOSE,) 2 MG/1.5ML SOPN [id number] No Inject 0.25 mg into the skin every Monday.  [provider] Past Week Active Self  sertraline (ZOLOFT) 50 MG tablet [id number] No Take 50 mg by mouth daily. [provider] 04/28/2021 Active Self  spironolactone (ALDACTONE) 25 MG tablet [id number] No Take 25 mg by mouth daily. [provider] 04/28/2021 Active Self  SURE COMFORT PEN NEEDLES 31G X 8 MM MISC [id number]  See admin instructions. [provider]  Active   terazosin (HYTRIN) 5 MG capsule [id number] No Take 5 mg by mouth at bedtime. [provider] Past Week Active Self  traZODone (DESYREL) 100 MG tablet [id number] No Take 100-300 mg by mouth at bedtime as needed for sleep. [provider] Past Week Active Self           Patient Active Problem List   Diagnosis Date Noted   Weakness of right upper extremity 05/22/2021   HLD (hyperlipidemia) 04/29/2021   Acute CVA (cerebrovascular accident) ([place]) 04/29/2021   Compression of right ulnar nerve at multiple levels 07/30/2020   Paresthesias 05/22/2020   Carpal tunnel syndrome of right wrist 02/26/2020   Hypomagnesemia 03/04/2019   Ulnar neuropathy of right upper extremity 03/04/2019   Ulnar nerve compression, left 02/11/2019   Status post placement of implantable loop recorder 12/07/2018   Degenerative cervical disc 11/04/2018   COVID-19 virus infection    Suspected COVID-19 virus infection 10/09/2018   Viral gastroenteritis 10/09/2018   Acute respiratory failure with hypoxia ([place]) 10/08/2018   Medication management 08/27/2018   Cerebellar cerebrovascular accident (CVA) without late effect 05/09/2018   Bell's palsy 05/09/2018   Current use of insulin ([place]) 03/19/2018   Dry eye syndrome of both lacrimal glands 03/19/2018   Vitreous floaters of both eyes 03/19/2018   Premature ejaculation, acquired, generalized, moderate 02/26/2018   Gross hematuria 02/19/2018   Moderate episode of recurrent major depressive disorder ([place]) 02/06/2018   Grief 02/05/2018   SI (sacroiliac) pain  11/30/2017   GAD (generalized anxiety disorder) 07/20/2017   Severe nonproliferative diabetic retinopathy of both eyes without macular edema associated with type 2 diabetes mellitus ([place]) 07/20/2017   Thoracic radiculopathy 02/21/2017   Chronic right-sided low back pain with right-sided sciatica 02/16/2017   Alcohol abuse 11/01/2016   Intentional drug overdose ([place]) 10/31/2016   Severe episode of recurrent major depressive disorder, without psychotic features ([place]) 10/31/2016   Chronic pain of right knee 03/24/2016   Palpitations 08/17/2015   Vitamin D deficiency 06/24/2015   Adult situational stress disorder 06/10/2015   Recurrent depression ([place]) 19/62/2297   Umbilical hernia 98/92/1194   Acute pancreatitis 06/02/2015   Personality disorder ([place]) 04/03/2015   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 04/01/2015   Dyspnea on exertion 02/26/2015   Sleep apnea 02/26/2015   Type 2 diabetes mellitus with hyperglycemia, with long-term current use of insulin ([place]) 02/09/2015   HTN (hypertension) 02/09/2015   Adult BMI 45.0-49.9 kg/sq m ([place]) 02/09/2015   Morbid obesity ([place]) 02/09/2015   Chest pain 02/06/2015     SDOH:  (Social Determinants of Health) assessments and interventions performed:    Care Plan  Review of patient past medical history, allergies, medications, health status, including review of consultants  reports, laboratory and other test data, was performed as part of comprehensive evaluation for care management services.   Care Plan : RN Care Manager Plan of Care  Updates made by Barbaraann Faster, RN since 05/24/2021 12:00 AM     Problem: Complex Care Coordination Needs and disease management in patient with stroke   Priority: High  Onset Date: 05/19/2021     Goal: Establish Plan of Care for Management Complex SDOH Barriers, disease management and Care Coordination Needs in patient with   Start Date: 05/19/2021  This Visit's Progress: On track   Recent Progress: Not on track  Priority: High  Note:   Current Barriers:  Care Coordination needs related to Worsening symptoms for stroke  RN CM Clinical Goal(s):  Patient will  received care coordination for worsening stroke symptoms to prevent re admission  through collaboration with RN Care manager, provider, and care team.   Interventions: Outreach to patient, care coordination outreaches, education/disease management and resources to be provided prn  Inter-disciplinary care team collaboration (see longitudinal plan of care) Evaluation of current treatment plan related to  self management and patient's adherence to plan as established by provider 05/24/21 Assessed for follow up on recent orthopedic appointment/xray/treatment plan Commended him for his advocacy and outreach to neurology- He voiced appreciation EMMI sent via e-mail on neuropathic pain Sent patient lists of in network local [place] medicare and medicaid doctors for choice  Stroke:  (Status:New goal.) Short Term Goal Discussed with him that with his medicare/medicaid coverage he would have a selection of available accepting local providers, referred him to medicare.gov Frequently referred him to his After summary sheet/hospital discharge sheet Noted pt without a neurology follow up Outreach with patient to Neurology Pt prefers to switch from Dr Ermalene Postin Neurology to local neurology services Spoke with Jinny Blossom who states call attempts had been made unsuccessfully to patient. Next available Monday July 26 2021  9 am Dr Leonie Man and high priority on cancellation list. Attempt to transferred to Dr Leonie Man Nurse but patient referred to his pcp RN CM  Encouraged patient to outreach to pcp to follow up on pt previous outreach, to report symptoms and to inquire about possible orders for home health referral prn  Encouraged seeking emergency or urgent medical services if symptoms worsen Encouraged to have orthopedic MD, Dr Gerarda Fraction  evaluate the symptoms on 05/21/21 Discussed similar symptoms of strokes, neuropathy, ulnar nerve compression and carpal tunnel Reviewed the signs of strokes Sent EMMI education to e-mail listed in EPIC - diabetes type 2, stroke and TIA overview, stroke" preventing recurrent stroke, carpal tunnel syndrome  Patient Goals/Self-Care Activities: Take all medications as prescribed Attend all scheduled provider appointments Call provider office for new concerns or questions  outreach to pcp, neurology, orthopedic prn Outreach to 24 hour nurse call [facility] number prn as provided by RN CM   Follow Up Plan:  The patient has been provided with contact information for the care management team and has been advised to call with any health related questions or concerns.  The care management team will reach out to the patient again over the next 30+ business  days.       Plan: The patient has been provided with contact information for the care management team and has been advised to call with any health related questions or concerns.  The care management team will reach out to the patient again over the next 30+ business days.  Aujanae Mccullum L. Lavina Hamman, RN, BSN, CCM [facility] Telephonic  Care Management Care Coordinator Office number [phone] [facility] number [phone] Fax number [phone]

## 2021-05-31 ENCOUNTER — Other Ambulatory Visit: Payer: Self-pay

## 2021-05-31 ENCOUNTER — Encounter: Payer: Self-pay | Admitting: *Deleted

## 2021-05-31 ENCOUNTER — Other Ambulatory Visit: Payer: Self-pay | Admitting: *Deleted

## 2021-05-31 NOTE — Patient Outreach (Signed)
[facility]) Care Management Telephonic RN Care Manager Note   05/31/2021 Name:  Christopher Roberson MRN:  [id number] DOB:  Jul 22, 1975  Summary: Follow up EMMI stroke outreach with patient to check on RUE pain and neurology appointment Christopher Roberson has been offered an earlier neurology appointment and this will allow him to be evaluated for his reported RUE symptoms he and RN CM outreached to neurology about on 05/20/20- having a hard time with mobility, right arm numbness, tingling and "feels like dead weight" , headache 132/90 this morning dizziness with standing/walking not with sitting, pain in neck radiates down right arm, for the last 3 days episodes in which saturates clothing with sweats when in pain, right hand contractures, pain level 8-9 level "has me in tears"   Recommendations/Changes made from today's visit: Assessed for neurology appointment, worsening symptoms EMMI case closure with encouragement of patient to outreach to RN CM if further care coordination is needed   Subjective: Christopher Roberson is an 46 y.o. year old male who is a primary patient of [name], FNP. The care management team was consulted for assistance with care management and/or care coordination needs.    Telephonic RN Care Manager completed Telephone Visit today.   Objective:  Medications Reviewed Today     Reviewed by Barbaraann Faster, RN (Registered Nurse) on 05/24/21 at [place] List Status: <None>   Medication Order Taking? Sig Documenting Provider Last Dose Status Informant  albuterol (VENTOLIN HFA) 108 (90 Base) MCG/ACT inhaler [id number] No Inhale 2 puffs into the lungs every 4 (four) hours as needed for wheezing or shortness of breath. [provider] Past Month Active Self  amLODipine (NORVASC) 5 MG tablet [id number] No Take 5 mg by mouth daily. [provider] 04/28/2021 Active Self  atorvastatin (LIPITOR) 80 MG tablet [id number]  Take 1 tablet (80 mg total) by  mouth every morning. [name], MD  Active   blood glucose meter kit and supplies KIT [id number]  Dispense based on patient and insurance preference. Use up to four times daily as directed. [name], MD  Active   carvedilol (COREG) 25 MG tablet [id number]  Take 25 mg by mouth 2 (two) times daily. [provider]  Active   chlorthalidone (HYGROTON) 25 MG tablet [id number] No Take 25 mg by mouth daily. [provider] 04/28/2021 Active Self  clopidogrel (PLAVIX) 75 MG tablet [id number]  Take 1 tablet (75 mg total) by mouth daily. [name], MD  Active   cyclobenzaprine (FLEXERIL) 5 MG tablet [id number] No Take 5 mg by mouth 3 (three) times daily as needed for muscle spasms. [provider] Past Week Active Self  dapagliflozin propanediol (FARXIGA) 5 MG TABS tablet [id number] No Take 5 mg by mouth daily. [provider] 04/28/2021 Active Self  insulin glargine (LANTUS SOLOSTAR) 100 UNIT/ML Solostar Pen [id number]  Take 30units every morning and 45units every night [name], MD  Active   Insulin Pen Needle 32G X 4 MM MISC [id number]  Use as directed [name], MD  Active   lisinopril (ZESTRIL) 40 MG tablet [id number] No Take 1 tablet (40 mg total) by mouth every morning. Resume in  1 week  Patient taking differently: Take 40 mg by mouth daily.   [name], [name], MD 04/28/2021 Active Self  metFORMIN (GLUCOPHAGE) 1000 MG tablet [id number]  Take 1 tablet (1,000 mg total) by mouth 2 (two) times daily with a meal. [name], MD  Active   metoprolol succinate (  TOPROL-XL) 50 MG 24 hr tablet [id number]  Take 1 tablet (50 mg total) by mouth daily. [name], MD  Active   olmesartan (BENICAR) 20 MG tablet [id number] No Take 20 mg by mouth daily. [provider] 04/28/2021 Active Self  pregabalin (LYRICA) 150 MG capsule [id number] No Take 150 mg by mouth 2 (two) times daily as needed (pain). [provider] Past Week Active Self   Semaglutide,0.25 or 0.5MG/DOS, (OZEMPIC, 0.25 OR 0.5 MG/DOSE,) 2 MG/1.5ML SOPN [id number] No Inject 0.25 mg into the skin every Monday. [provider] Past Week Active Self  sertraline (ZOLOFT) 50 MG tablet [id number] No Take 50 mg by mouth daily. [provider] 04/28/2021 Active Self  spironolactone (ALDACTONE) 25 MG tablet [id number] No Take 25 mg by mouth daily. [provider] 04/28/2021 Active Self  SURE COMFORT PEN NEEDLES 31G X 8 MM MISC [id number]  See admin instructions. [provider]  Active   terazosin (HYTRIN) 5 MG capsule [id number] No Take 5 mg by mouth at bedtime. [provider] Past Week Active Self  traZODone (DESYREL) 100 MG tablet [id number] No Take 100-300 mg by mouth at bedtime as needed for sleep. [provider] Past Week Active Self           Patient Active Problem List   Diagnosis Date Noted   Weakness of right upper extremity 05/22/2021   HLD (hyperlipidemia) 04/29/2021   Acute CVA (cerebrovascular accident) ([place]) 04/29/2021   Compression of right ulnar nerve at multiple levels 07/30/2020   Paresthesias 05/22/2020   Carpal tunnel syndrome of right wrist 02/26/2020   Hypomagnesemia 03/04/2019   Ulnar neuropathy of right upper extremity 03/04/2019   Ulnar nerve compression, left 02/11/2019   Status post placement of implantable loop recorder 12/07/2018   Degenerative cervical disc 11/04/2018   COVID-19 virus infection    Suspected COVID-19 virus infection 10/09/2018   Viral gastroenteritis 10/09/2018   Acute respiratory failure with hypoxia ([place]) 10/08/2018   Medication management 08/27/2018   Cerebellar cerebrovascular accident (CVA) without late effect 05/09/2018   Bell's palsy 05/09/2018   Current use of insulin ([place]) 03/19/2018   Dry eye syndrome of both lacrimal glands 03/19/2018   Vitreous floaters of both eyes 03/19/2018   Premature ejaculation, acquired, generalized, moderate 02/26/2018   Gross  hematuria 02/19/2018   Moderate episode of recurrent major depressive disorder ([place]) 02/06/2018   Grief 02/05/2018   SI (sacroiliac) pain 11/30/2017   GAD (generalized anxiety disorder) 07/20/2017   Severe nonproliferative diabetic retinopathy of both eyes without macular edema associated with type 2 diabetes mellitus ([place]) 07/20/2017   Thoracic radiculopathy 02/21/2017   Chronic right-sided low back pain with right-sided sciatica 02/16/2017   Alcohol abuse 11/01/2016   Intentional drug overdose ([place]) 10/31/2016   Severe episode of recurrent major depressive disorder, without psychotic features ([place]) 10/31/2016   Chronic pain of right knee 03/24/2016   Palpitations 08/17/2015   Vitamin D deficiency 06/24/2015   Adult situational stress disorder 06/10/2015   Recurrent depression ([place]) 97/41/6384   Umbilical hernia 53/64/6803   Acute pancreatitis 06/02/2015   Personality disorder ([place]) 04/03/2015   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 04/01/2015   Dyspnea on exertion 02/26/2015   Sleep apnea 02/26/2015   Type 2 diabetes mellitus with hyperglycemia, with long-term current use of insulin ([place]) 02/09/2015   HTN (hypertension) 02/09/2015   Adult BMI 45.0-49.9 kg/sq m ([place]) 02/09/2015   Morbid obesity ([place]) 02/09/2015   Chest pain 02/06/2015  SDOH:  (Social Determinants of Health) assessments and interventions performed:  SDOH Interventions    Flowsheet Row Most Recent Value  SDOH Interventions   Food Insecurity Interventions Intervention Not Indicated  Housing Interventions Intervention Not Indicated  Stress Interventions Other (Comment), Intervention Not Indicated  Transportation Interventions Intervention Not Indicated       Care Plan  Review of patient past medical history, allergies, medications, health status, including review of consultants reports, laboratory and other test data, was performed as part of comprehensive evaluation for care  management services.   Care Plan : RN Care Manager Plan of Care  Updates made by Barbaraann Faster, RN since 05/31/2021 12:00 AM     Problem: Complex Care Coordination Needs and disease management in patient with stroke   Priority: High  Onset Date: 05/19/2021     Goal: Establish Plan of Care for Management Complex SDOH Barriers, disease management and Care Coordination Needs in patient with   Start Date: 05/19/2021  This Visit's Progress: On track  Recent Progress: On track  Priority: High  Note:   Current Barriers:  Care Coordination needs related to Worsening symptoms for stroke  RN CM Clinical Goal(s):  Patient will  received care coordination for worsening stroke symptoms to prevent re admission  through collaboration with RN Care manager, provider, and care team.  05/31/21 Goal Met-- pt received earlier neurology appointment (06/14/21) to get evaluated for pain of RUE, Headache, sweats, dizziness  Interventions: Outreach to patient, care coordination outreaches, education/disease management and resources to be provided prn  Inter-disciplinary care team collaboration (see longitudinal plan of care) Evaluation of current treatment plan related to  self management and patient's adherence to plan as established by provider 05/24/21 Assessed for follow up on recent orthopedic appointment/xray/treatment plan Commended him for his advocacy and outreach to neurology- He voiced appreciation EMMI sent via e-mail on neuropathic pain Sent patient lists of in network local [place] medicare and medicaid doctors for choice 05/31/21 follow up with EMMI case closure and option for pt to outreach to RN CM as needed   Stroke:  (Status:Goal Met.) Short Term Goal- 05/31/21 follow up with EMMI case closure and option for pt to outreach to RN CM as needed  Discussed with him that with his medicare/medicaid coverage he would have a selection of available accepting local providers, referred him to  medicare.gov Frequently referred him to his After summary sheet/hospital discharge sheet Noted pt without a neurology follow up Outreach with patient to Neurology Pt prefers to switch from Dr Ermalene Postin Neurology to local neurology services Spoke with Jinny Blossom who states call attempts had been made unsuccessfully to patient. Next available Monday July 26 2021  9 am Dr Leonie Man and high priority on cancellation list. Attempt to transferred to Dr Leonie Man Nurse but patient referred to his pcp RN CM  Encouraged patient to outreach to pcp to follow up on pt previous outreach, to report symptoms and to inquire about possible orders for home health referral prn  Encouraged seeking emergency or urgent medical services if symptoms worsen Encouraged to have orthopedic MD, Dr Gerarda Fraction evaluate the symptoms on 05/21/21 Discussed similar symptoms of strokes, neuropathy, ulnar nerve compression and carpal tunnel Reviewed the signs of strokes Sent EMMI education to e-mail listed in EPIC - diabetes type 2, stroke and TIA overview, stroke" preventing recurrent stroke, carpal tunnel syndrome  Patient Goals/Self-Care Activities: Take all medications as prescribed Attend all scheduled provider appointments Call provider office for new concerns or questions  outreach to pcp, neurology, orthopedic prn Outreach to 24 hour nurse call center number prn as provided by RN CM   Follow Up Plan:  The patient has been provided with contact information for the care management team and has been advised to call with any health related questions or concerns.  No further follow up required: EMMI stroke pt with access to earlier neurology appointment to evaluate reported worsening symptoms not resolved during recent orthopedic 05/21/21 appointment PCP following also       Plan: The patient has been provided with contact information for the care management team and has been advised to call with any health related questions or concerns.  No further  follow up required: EMMI stroke pt with access to earlier neurology appointment to evaluate reported worsening symptoms not resolved during recent orthopedic 05/21/21 appointment PCP following also  [place]. Lavina Hamman, RN, BSN, [place] Coordinator Office number [phone] [address] [facility] number [phone] Fax number [phone]

## 2021-06-13 ENCOUNTER — Telehealth: Payer: Self-pay | Admitting: Neurology

## 2021-06-13 NOTE — Telephone Encounter (Signed)
Dr. Leonie Man will be out of the office tomorrow. I sent the patient a mychart message letting them know their appointment is being cancelled and they need to call the office tomorrow to get rescheduled.

## 2021-06-14 ENCOUNTER — Ambulatory Visit: Payer: Medicare Other | Admitting: Neurology

## 2021-06-17 ENCOUNTER — Encounter: Payer: Self-pay | Admitting: Neurology

## 2021-06-17 ENCOUNTER — Ambulatory Visit (INDEPENDENT_AMBULATORY_CARE_PROVIDER_SITE_OTHER): Payer: Medicare Other | Admitting: Neurology

## 2021-06-17 VITALS — BP 166/95 | HR 86 | Ht 68.0 in | Wt 290.0 lb

## 2021-06-17 DIAGNOSIS — M542 Cervicalgia: Secondary | ICD-10-CM | POA: Diagnosis not present

## 2021-06-17 DIAGNOSIS — M25519 Pain in unspecified shoulder: Secondary | ICD-10-CM | POA: Diagnosis not present

## 2021-06-17 DIAGNOSIS — I6381 Other cerebral infarction due to occlusion or stenosis of small artery: Secondary | ICD-10-CM

## 2021-06-17 NOTE — Patient Instructions (Signed)
I had a long d/w patient about his recent stroke, risk for recurrent stroke/TIAs, personally independently reviewed imaging studies and stroke evaluation results and answered questions.Continue Plavix (clopidogrel ) 75 mg daily  for secondary stroke prevention and maintain strict control of hypertension with blood pressure goal below 130/90, diabetes with hemoglobin A1c goal below 6.5% and lipids with LDL cholesterol goal below 70 mg/dL. I also advised the patient to eat a healthy diet with plenty of whole grains, cereals, fruits and vegetables, exercise regularly and maintain ideal body weight I strongly encouraged the patient to lose weight as he appears to be morbidly obese and at significant risk for problems down the road.  I also check lipid panel today.  He was advised to see his primary care physician for help with his neck and shoulder pain which seems to be quite bothersome to him at the present time.  Followup in the future with my nurse practitioner in 3 months. Stroke Prevention Some medical conditions and behaviors can lead to a higher chance of having a stroke. You can help prevent a stroke by eating healthy, exercising, not smoking, and managing any medical conditions you have. Stroke is a leading cause of functional impairment. Primary prevention is particularly important because a majority of strokes are first-time events. Stroke changes the lives of not only those who experience a stroke but also their family and other caregivers. How can this condition affect me? A stroke is a medical emergency and should be treated right away. A stroke can lead to brain damage and can sometimes be life-threatening. If a person gets medical treatment right away, there is a better chance of surviving and recovering from a stroke. What can increase my risk? The following medical conditions may increase your risk of a stroke: Cardiovascular disease. High blood pressure (hypertension). Diabetes. High  cholesterol. Sickle cell disease. Blood clotting disorders (hypercoagulable state). Obesity. Sleep disorders (obstructive sleep apnea). Other risk factors include: Being older than age 61. Having a history of blood clots, stroke, or mini-stroke (transient ischemic attack, TIA). Genetic factors, such as race, ethnicity, or a family history of stroke. Smoking cigarettes or using other tobacco products. Taking birth control pills, especially if you also use tobacco. Heavy use of alcohol or drugs, especially cocaine and methamphetamine. Physical inactivity. What actions can I take to prevent this? Manage your health conditions High cholesterol levels. Eating a healthy diet is important for preventing high cholesterol. If cholesterol cannot be managed through diet alone, you may need to take medicines. Take any prescribed medicines to control your cholesterol as told by your health care provider. Hypertension. To reduce your risk of stroke, try to keep your blood pressure below 130/80. Eating a healthy diet and exercising regularly are important for controlling blood pressure. If these steps are not enough to manage your blood pressure, you may need to take medicines. Take any prescribed medicines to control hypertension as told by your health care provider. Ask your health care provider if you should monitor your blood pressure at home. Have your blood pressure checked every year, even if your blood pressure is normal. Blood pressure increases with age and some medical conditions. Diabetes. Eating a healthy diet and exercising regularly are important parts of managing your blood sugar (glucose). If your blood sugar cannot be managed through diet and exercise, you may need to take medicines. Take any prescribed medicines to control your diabetes as told by your health care provider. Get evaluated for obstructive sleep apnea. Talk to  your health care provider about getting a sleep evaluation if  you snore a lot or have excessive sleepiness. Make sure that any other medical conditions you have, such as atrial fibrillation or atherosclerosis, are managed. Nutrition Follow instructions from your health care provider about what to eat or drink to help manage your health condition. These instructions may include: Reducing your daily calorie intake. Limiting how much salt (sodium) you use to 1,500 milligrams (mg) each day. Using only healthy fats for cooking, such as olive oil, canola oil, or sunflower oil. Eating healthy foods. You can do this by: Choosing foods that are high in fiber, such as whole grains, and fresh fruits and vegetables. Eating at least 5 servings of fruits and vegetables a day. Try to fill one-half of your plate with fruits and vegetables at each meal. Choosing lean protein foods, such as lean cuts of meat, poultry without skin, fish, tofu, beans, and nuts. Eating low-fat dairy products. Avoiding foods that are high in sodium. This can help lower blood pressure. Avoiding foods that have saturated fat, trans fat, and cholesterol. This can help prevent high cholesterol. Avoiding processed and prepared foods. Counting your daily carbohydrate intake.  Lifestyle If you drink alcohol: Limit how much you have to: 0-1 drink a day for women who are not pregnant. 0-2 drinks a day for men. Know how much alcohol is in your drink. In the [place] one drink equals one 12 oz bottle of beer (311mL), one 5 oz glass of wine (130mL), or one 1 oz glass of hard liquor (70mL). Do not use any products that contain nicotine or tobacco. These products include cigarettes, chewing tobacco, and vaping devices, such as e-cigarettes. If you need help quitting, ask your health care provider. Avoid secondhand smoke. Do not use drugs. Activity  Try to stay at a healthy weight. Get at least 30 minutes of exercise on most days, such as: Fast walking. Biking. Swimming. Medicines Take  over-the-counter and prescription medicines only as told by your health care provider. Aspirin or blood thinners (antiplatelets or anticoagulants) may be recommended to reduce your risk of forming blood clots that can lead to stroke. Avoid taking birth control pills. Talk to your health care provider about the risks of taking birth control pills if: You are over 29 years old. You smoke. You get very bad headaches. You have had a blood clot. Where to find more information American Stroke Association: www.strokeassociation.org Get help right away if: You or a loved one has any symptoms of a stroke. "BE FAST" is an easy way to remember the main warning signs of a stroke: B - Balance. Signs are dizziness, sudden trouble walking, or loss of balance. E - Eyes. Signs are trouble seeing or a sudden change in vision. F - Face. Signs are sudden weakness or numbness of the face, or the face or eyelid drooping on one side. A - Arms. Signs are weakness or numbness in an arm. This happens suddenly and usually on one side of the body. S - Speech. Signs are sudden trouble speaking, slurred speech, or trouble understanding what people say. T - Time. Time to call emergency services. Write down what time symptoms started. You or a loved one has other signs of a stroke, such as: A sudden, severe headache with no known cause. Nausea or vomiting. Seizure. These symptoms may represent a serious problem that is an emergency. Do not wait to see if the symptoms will go away. Get medical help right away.  Call your local emergency services (911 in the [place] Do not drive yourself to the hospital. Summary You can help to prevent a stroke by eating healthy, exercising, not smoking, limiting alcohol intake, and managing any medical conditions you may have. Do not use any products that contain nicotine or tobacco. These include cigarettes, chewing tobacco, and vaping devices, such as e-cigarettes. If you need help quitting,  ask your health care provider. Remember "BE FAST" for warning signs of a stroke. Get help right away if you or a loved one has any of these signs. This information is not intended to replace advice given to you by your health care provider. Make sure you discuss any questions you have with your health care provider. Document Revised: 11/11/2019 Document Reviewed: 11/11/2019 Elsevier Patient Education  [place].

## 2021-06-17 NOTE — Progress Notes (Signed)
[facility] [address] [place]. [place] [address] [phone]       OFFICE FOLLOW-UP NOTE  [name] Date of Birth:  Sep 06, 1975 Medical Record Number:  [id number]   HPI: [name] is a 46 year old African-American male seen today for initial office follow-up visit following hospital admission for stroke in January 2023.Marland Kitchen  History is obtained from the patient and review of electronic medical records and opossum reviewed available pertinent imaging films in PACS.  He has past medical history significant for diabetes, pretension, obesity, sleep apnea who presented on 04/28/2021 with 2-day history of right upper extremity weakness numbness and facial droop.  He states he was sitting on his table at home when symptoms began.  He has a prior history of Bell's palsy on the left side in 2020 but his facial droop he felt was returning almost back to baseline but he felt he was numb on the off of his face and also complained of right shoulder and elbow pain and tenderness to palpation.  On exam he was found to have some subjective right upper extremity weakness and numbness with some facial droop and NIH stroke score of 2.  He was not considered for TNK as he presented outside the window.  CT angiogram of the brain and neck showed no significant large vessel stenosis or occlusion.  MRI scan showed a small subacute left corona radiator lacunar infarct.  Echocardiogram showed normal ejection fraction without cardiac source of embolism.  Urine drug screen was negative.  LDL cholesterol was elevated at 115 mg percent and hemoglobin A1c at 10.8.  He was started on dual antiplatelet therapy aspirin and Plavix for 3 weeks followed by Plavix alone.  Patient states is done well since discharge he is however still have some right grip and hand weakness.  Right neck and shoulder pain continues to bother him which limits his shoulder movements.  He still have some subjective numbness on the right  side but this involves the whole face as well as the leg as well.  His right leg strength he feels is much improved and he can walk better.  He is remains on Plavix alone which is tolerating well without bruising or bleeding.  He states he does not check his blood pressure at home but it is elevated today in office at 162/101.  Is tolerating Lipitor 80 mg well without muscle aches and pains.  He does have a sleep apnea machine but feels like he needs a new one and will talk to his physician about this.  He has returned back to work temporarily but feels that he is not doing well and may need to go out again.  He is not getting any therapies at present.  He has not been exercising regularly and has not lost any weight  ROS:   14 system review of systems is positive for weakness, numbness, neck pain, tingling, shoulder pain all other systems negative  PMH:  Past Medical History:  Diagnosis Date   Diabetes mellitus    Hypertension    Stroke [facility])     Social History:  Social History   Socioeconomic History   Marital status: Married    Spouse name: Not on file   Number of children: Not on file   Years of education: Not on file   Highest education level: Not on file  Occupational History   Not on file  Tobacco Use   Smoking status: Never   Smokeless tobacco: Never  Vaping Use   Vaping Use: Never used  Substance and Sexual Activity   Alcohol use: No   Drug use: No   Sexual activity: Not on file  Other Topics Concern   Not on file  Social History Narrative   Not on file   Social Determinants of Health   Financial Resource Strain: Low Risk    Difficulty of Paying Living Expenses: Not hard at all  Food Insecurity: No Food Insecurity   Worried About Charity fundraiser in the Last Year: Never true   Moody in the Last Year: Never true  Transportation Needs: No Transportation Needs   Lack of Transportation (Medical): No   Lack of Transportation (Non-Medical): No  Physical  Activity: Not on file  Stress: No Stress Concern Present   Feeling of Stress : Only a little  Social Connections: Not on file  Intimate Partner Violence: Not At Risk   Fear of Current or Ex-Partner: No   Emotionally Abused: No   Physically Abused: No   Sexually Abused: No    Medications:   Current Outpatient Medications on File Prior to Visit  Medication Sig Dispense Refill   albuterol (VENTOLIN HFA) 108 (90 Base) MCG/ACT inhaler Inhale 2 puffs into the lungs every 4 (four) hours as needed for wheezing or shortness of breath.     amLODipine (NORVASC) 5 MG tablet Take 5 mg by mouth daily.     atorvastatin (LIPITOR) 80 MG tablet Take 1 tablet (80 mg total) by mouth every morning. 30 tablet 1   blood glucose meter kit and supplies KIT Dispense based on patient and insurance preference. Use up to four times daily as directed. 1 each 0   carvedilol (COREG) 25 MG tablet Take 25 mg by mouth 2 (two) times daily.     chlorthalidone (HYGROTON) 25 MG tablet Take 25 mg by mouth daily.     clopidogrel (PLAVIX) 75 MG tablet Take 1 tablet (75 mg total) by mouth daily. 30 tablet 3   cyclobenzaprine (FLEXERIL) 5 MG tablet Take 5 mg by mouth 3 (three) times daily as needed for muscle spasms.     dapagliflozin propanediol (FARXIGA) 5 MG TABS tablet Take 5 mg by mouth daily.     insulin glargine (LANTUS SOLOSTAR) 100 UNIT/ML Solostar Pen Take 30units every morning and 45units every night 15 mL 11   lisinopril (ZESTRIL) 40 MG tablet Take 1 tablet (40 mg total) by mouth every morning. Resume in  1 week (Patient taking differently: Take 40 mg by mouth daily.)     losartan (COZAAR) 50 MG tablet Take 50 mg by mouth daily.     metFORMIN (GLUCOPHAGE) 1000 MG tablet Take 1 tablet (1,000 mg total) by mouth 2 (two) times daily with a meal. 60 tablet 2   metoprolol succinate (TOPROL-XL) 50 MG 24 hr tablet Take 1 tablet (50 mg total) by mouth daily. 30 tablet 2   olmesartan (BENICAR) 20 MG tablet Take 20 mg by mouth  daily.     pregabalin (LYRICA) 150 MG capsule Take 150 mg by mouth 2 (two) times daily as needed (pain).     Semaglutide,0.25 or 0.5MG/DOS, (OZEMPIC, 0.25 OR 0.5 MG/DOSE,) 2 MG/1.5ML SOPN Inject 0.25 mg into the skin every Monday.     sertraline (ZOLOFT) 50 MG tablet Take 50 mg by mouth daily.     spironolactone (ALDACTONE) 25 MG tablet Take 25 mg by mouth daily.     SURE COMFORT PEN NEEDLES 31G X  8 MM MISC See admin instructions.     terazosin (HYTRIN) 5 MG capsule Take 5 mg by mouth at bedtime.     traZODone (DESYREL) 100 MG tablet Take 100-300 mg by mouth at bedtime as needed for sleep.     Insulin Pen Needle 32G X 4 MM MISC Use as directed 100 each 2   No current facility-administered medications on file prior to visit.    Allergies:   Allergies  Allergen Reactions   Percocet [Oxycodone-Acetaminophen] Itching    Can take if takes with Benadryl    Physical Exam General: Morbidly obese middle-aged African-American male, seated, in no evident distress Head: head normocephalic and atraumatic.  Neck: supple with no carotid or supraclavicular bruits Cardiovascular: regular rate and rhythm, no murmurs Musculoskeletal: no deformity Skin:  no rash/petichiae Vascular:  Normal pulses all extremities Vitals:   06/17/21 1224 06/17/21 1227  BP: (!) 162/101 (!) 166/95  Pulse: 86 86   Neurologic Exam Mental Status: Awake and fully alert. Oriented to place and time. Recent and remote memory intact. Attention span, concentration and fund of knowledge appropriate. Mood and affect appropriate.  Cranial Nerves: Fundoscopic exam reveals sharp disc margins. Pupils equal, briskly reactive to light. Extraocular movements full without nystagmus. Visual fields full to confrontation. Hearing intact. Facial sensation intact.  Trace right lower facial asymmetry when he smiles.  Tongue, palate moves normally and symmetrically.  Motor: Normal bulk and tone. Normal strength in all tested extremity muscles  except mild weakness of right grip and intrinsic hand muscles.  Orbits left over right upper extremity.  Right shoulder movements limited due to neck and shoulder pain.  Subjective. Sensory.:  Diminished diminished hemibody touch ,pinprick .position and vibratory sensation with nonorganic pattern and splitting of the midline of the forehead even for vibration..  Coordination: Rapid alternating movements normal in all extremities. Finger-to-nose and heel-to-shin performed accurately bilaterally. Gait and Station: Arises from chair without difficulty. Stance is normal. Gait demonstrates normal stride length and balance . Able to heel, toe and tandem walk with slight difficulty.  Reflexes: 1+ and symmetric. Toes downgoing.   NIHSS  1 Modified Rankin  2   ASSESSMENT: 46 year old African-American male with small left subcortical lacunar stroke in January 2023 with mild residual right-sided weakness.  Vascular risk factors of morbid obesity, hyperlipidemia, diabetes and hypertension.     PLAN: I had a long d/w patient about his recent stroke, risk for recurrent stroke/TIAs, personally independently reviewed imaging studies and stroke evaluation results and answered questions.Continue Plavix (clopidogrel ) 75 mg daily  for secondary stroke prevention and maintain strict control of hypertension with blood pressure goal below 130/90, diabetes with hemoglobin A1c goal below 6.5% and lipids with LDL cholesterol goal below 70 mg/dL. I also advised the patient to eat a healthy diet with plenty of whole grains, cereals, fruits and vegetables, exercise regularly and maintain ideal body weight I strongly encouraged the patient to lose weight as he appears to be morbidly obese and at significant risk for problems down the road.  I also check lipid panel today.  He was advised to see his primary care physician for help with his neck and shoulder pain which seems to be quite bothersome to him at the present time.   Followup in the future with my nurse practitioner in 3 months. Greater than 50% of time during this 35 minute visit was spent on counseling,explanation of diagnosis, planning of further management, discussion with patient and family and coordination of care Hasan Douse  Leonie Man, MD Note: This document was prepared with digital dictation and possible smart phrase technology. Any transcriptional errors that result from this process are unintentional

## 2021-06-18 LAB — LIPID PANEL
Chol/HDL Ratio: 3 ratio (ref 0.0–5.0)
Cholesterol, Total: 183 mg/dL (ref 100–199)
HDL: 62 mg/dL (ref >39)
LDL Chol Calc (NIH): 103 mg/dL — ABNORMAL HIGH (ref 0–99)
Triglycerides: 102 mg/dL (ref 0–149)
VLDL Cholesterol Cal: 18 mg/dL (ref 5–40)

## 2021-06-25 ENCOUNTER — Ambulatory Visit (HOSPITAL_COMMUNITY)
Admission: EM | Admit: 2021-06-25 | Discharge: 2021-06-25 | Disposition: A | Discharge status: Home / Self Care | Payer: Medicare Other | Attending: Internal Medicine | Admitting: Internal Medicine

## 2021-06-25 ENCOUNTER — Encounter (HOSPITAL_COMMUNITY): Payer: Self-pay

## 2021-06-25 DIAGNOSIS — M5432 Sciatica, left side: Secondary | ICD-10-CM

## 2021-06-25 LAB — CBG MONITORING, ED: Glucose-Capillary: 188 mg/dL — ABNORMAL HIGH (ref 70–99)

## 2021-06-25 MED ORDER — METHOCARBAMOL 500 MG PO TABS: 500.0000 mg | ORAL_TABLET | Freq: Every evening | ORAL | 0 refills | Status: DC | PRN

## 2021-06-25 MED ORDER — KETOROLAC TROMETHAMINE 30 MG/ML IJ SOLN
30.0000 mg | Freq: Once | INTRAMUSCULAR | Status: AC
  Administered 2021-06-25: 30 mg via INTRAMUSCULAR

## 2021-06-25 MED ORDER — KETOROLAC TROMETHAMINE 30 MG/ML IJ SOLN
INTRAMUSCULAR | Status: AC
  Filled 2021-06-25: qty 1

## 2021-06-25 NOTE — Discharge Instructions (Addendum)
Gentle stretches recommended ?Take anti-inflammatory agents every 6-8 hours as needed ?Take medications as prescribed.  Do not drive or operate heavy machinery after taking muscle relaxants.also relaxants makes you drowsy. ?Heating pad use on the 20 minutes on-20 minutes off cycle. ?Return to urgent care if symptoms worsen. ?

## 2021-06-25 NOTE — ED Triage Notes (Signed)
Pt presents with left leg pain from hip radiating down left leg since last night. ?

## 2021-06-28 NOTE — ED Provider Notes (Signed)
[facility]    CSN: [id number] Arrival date & time: 06/25/21  1310      History   Chief Complaint Chief Complaint  Patient presents with   Leg Pain    HPI Christopher Roberson is a 46 y.o. male comes to the urgent care with left leg pain which started last night.  Patient has pain starting from the lower back radiating into the left leg.  Pain is currently of moderate severity, sharp, throbbing and pain is aggravated by movement with no known relieving factors.  He denies any weakness in the left lower extremity.  No heavy lifting or trauma to the back.  Patient has a history of sciatica but that has been on the right side in the past.  Patient has numbness in his feet which is unchanged.  Patient has been taking ibuprofen as needed for pain with no resolution of symptoms.  HPI  Past Medical History:  Diagnosis Date   Diabetes mellitus    Hypertension    Stroke [facility])     Patient Active Problem List   Diagnosis Date Noted   Weakness of right upper extremity 05/22/2021   HLD (hyperlipidemia) 04/29/2021   Acute CVA (cerebrovascular accident) ([place]) 04/29/2021   Compression of right ulnar nerve at multiple levels 07/30/2020   Paresthesias 05/22/2020   Carpal tunnel syndrome of right wrist 02/26/2020   Hypomagnesemia 03/04/2019   Ulnar neuropathy of right upper extremity 03/04/2019   Ulnar nerve compression, left 02/11/2019   Status post placement of implantable loop recorder 12/07/2018   Degenerative cervical disc 11/04/2018   COVID-19 virus infection    Suspected COVID-19 virus infection 10/09/2018   Viral gastroenteritis 10/09/2018   Acute respiratory failure with hypoxia ([place]) 10/08/2018   Medication management 08/27/2018   Cerebellar cerebrovascular accident (CVA) without late effect 05/09/2018   Bell's palsy 05/09/2018   Current use of insulin ([place]) 03/19/2018   Dry eye syndrome of both lacrimal glands 03/19/2018   Vitreous floaters of both eyes 03/19/2018    Premature ejaculation, acquired, generalized, moderate 02/26/2018   Gross hematuria 02/19/2018   Moderate episode of recurrent major depressive disorder ([place]) 02/06/2018   Grief 02/05/2018   SI (sacroiliac) pain 11/30/2017   GAD (generalized anxiety disorder) 07/20/2017   Severe nonproliferative diabetic retinopathy of both eyes without macular edema associated with type 2 diabetes mellitus ([place]) 07/20/2017   Thoracic radiculopathy 02/21/2017   Chronic right-sided low back pain with right-sided sciatica 02/16/2017   Alcohol abuse 11/01/2016   Intentional drug overdose ([place]) 10/31/2016   Severe episode of recurrent major depressive disorder, without psychotic features ([place]) 10/31/2016   Chronic pain of right knee 03/24/2016   Palpitations 08/17/2015   Vitamin D deficiency 06/24/2015   Adult situational stress disorder 06/10/2015   Recurrent depression ([place]) 60/63/0160   Umbilical hernia 10/93/2355   Acute pancreatitis 06/02/2015   Personality disorder ([place]) 04/03/2015   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 Authorizing Provider  methocarbamol (ROBAXIN) 500 MG tablet Take 1 tablet (500 mg total) by mouth at bedtime as needed for muscle spasms. 06/25/21  Yes Katelyn Broadnax, Myrene Galas, MD  albuterol (VENTOLIN HFA) 108 (90 Base) MCG/ACT inhaler Inhale 2 puffs into the lungs every 4 (four) hours as needed for wheezing or shortness of breath.  03/12/16   [provider]  amLODipine (NORVASC) 5 MG tablet Take 5 mg by mouth daily. 03/31/21   [provider]  atorvastatin (LIPITOR) 80 MG tablet Take 1 tablet (80 mg total) by mouth every morning. 04/30/21   [name], MD  blood glucose meter kit and supplies KIT Dispense based on patient and insurance preference. Use up to four times daily as directed. 04/30/21   [name], MD  carvedilol (COREG) 25 MG tablet Take 25 mg by mouth 2 (two) times daily. 05/10/21   [provider]  chlorthalidone (HYGROTON) 25 MG tablet Take 25 mg by mouth daily. 11/25/20   [provider]  clopidogrel (PLAVIX) 75 MG tablet Take 1 tablet (75 mg total) by mouth daily. 05/01/21   [name], MD  dapagliflozin propanediol (FARXIGA) 5 MG TABS tablet Take 5 mg by mouth daily. 08/13/20   [provider]  insulin glargine (LANTUS SOLOSTAR) 100 UNIT/ML Solostar Pen Take 30units every morning and 45units every night 04/30/21   [name], MD  Insulin Pen Needle 32G X 4 MM MISC Use as directed 04/30/21   [name], MD  lisinopril (ZESTRIL) 40 MG tablet Take 1 tablet (40 mg total) by mouth every morning. Resume in  1 week Patient taking differently: Take 40 mg by mouth daily. 10/10/18   [name], [name], MD  losartan (COZAAR) 50 MG tablet Take 50 mg by mouth daily. 05/31/21   [provider]  metFORMIN (GLUCOPHAGE) 1000 MG tablet Take 1 tablet (1,000 mg total) by mouth 2 (two) times daily with a meal. 04/30/21   [name], MD  metoprolol succinate (TOPROL-XL) 50 MG 24 hr tablet Take 1 tablet (50 mg total) by mouth daily. 04/30/21   [name], MD  olmesartan (BENICAR) 20 MG tablet Take 20 mg by mouth daily. 11/25/20   [provider]  pregabalin (LYRICA) 150 MG capsule Take 150 mg by mouth 2 (two) times daily as needed (pain). 09/18/18   [provider]  Semaglutide,0.25 or 0.5MG/DOS, (OZEMPIC, 0.25 OR 0.5 MG/DOSE,) 2 MG/1.5ML SOPN  Inject 0.25 mg into the skin every Monday. 08/20/18   [provider]  sertraline (ZOLOFT) 50 MG tablet Take 50 mg by mouth daily. 08/01/20   [provider]  spironolactone (ALDACTONE) 25 MG tablet Take 25 mg by mouth daily. 06/24/20   [provider]  SURE COMFORT PEN NEEDLES 31G X 8 MM MISC See admin instructions. 05/05/21   [provider]  terazosin (HYTRIN) 5 MG capsule Take 5 mg by mouth at bedtime. 05/30/18   [provider]  traZODone (DESYREL) 100 MG tablet Take 100-300 mg by mouth at bedtime as needed for sleep. 07/26/18   [provider]    Family History Family History  Problem Relation Age of Onset   Heart disease Mother     Social History Social History   Tobacco Use   Smoking status: Never   Smokeless tobacco: Never  Vaping Use   Vaping Use: Never used  Substance Use Topics   Alcohol use: No   Drug use: No  Allergies   Percocet [oxycodone-acetaminophen]   Review of Systems Review of Systems  Respiratory: Negative.    Gastrointestinal: Negative.   Genitourinary: Negative.   Musculoskeletal:  Positive for back pain. Negative for gait problem and joint swelling.  Neurological: Negative.     Physical Exam Triage Vital Signs ED Triage Vitals  Enc Vitals Group     BP 06/25/21 1353 (!) 156/91     Pulse Rate 06/25/21 1353 90     Resp 06/25/21 1353 17     Temp 06/25/21 1353 99.5 F (37.5 C)     Temp Source 06/25/21 1353 Oral     SpO2 06/25/21 1353 95 %     Weight --      Height --      Head Circumference --      Peak Flow --      Pain Score 06/25/21 1354 9     Pain Loc --      Pain Edu? --      Excl. in [place]? --    No data found.  Updated Vital Signs BP (!) 156/91 (BP Location: Left Arm)    Pulse 90    Temp 99.5 F (37.5 C) (Oral)    Resp 17    SpO2 95%   Visual Acuity Right Eye Distance:   Left Eye Distance:   Bilateral Distance:    Right Eye Near:   Left Eye Near:    Bilateral Near:      Physical Exam Vitals and nursing note reviewed.  Constitutional:      General: He is in acute distress.  Cardiovascular:     Rate and Rhythm: Normal rate and regular rhythm.  Pulmonary:     Effort: Pulmonary effort is normal.     Breath sounds: Normal breath sounds.  Abdominal:     General: Bowel sounds are normal.     Palpations: Abdomen is soft.  Musculoskeletal:        General: No swelling, tenderness, deformity or signs of injury. Normal range of motion.  Skin:    General: Skin is warm.  Neurological:     Mental Status: He is alert.     UC Treatments / Results  Labs (all labs ordered are listed, but only abnormal results are displayed) Labs Reviewed  CBG MONITORING, ED - Abnormal; Notable for the following components:      Result Value   Glucose-Capillary 188 (*)    All other components within normal limits    EKG   Radiology No results found.  Procedures Procedures (including critical care time)  Medications Ordered in UC Medications  ketorolac (TORADOL) 30 MG/ML injection 30 mg (30 mg Intramuscular Given 06/25/21 1456)    Initial Impression / Assessment and Plan / UC Course  I have reviewed the triage vital signs and the nursing notes.  Pertinent labs & imaging results that were available during my care of the patient were reviewed by me and considered in my medical decision making (see chart for details).     1.  Left-sided sciatica: Toradol 30 mg IM x1 dose Robaxin at bedtime as needed-precautions given Heating pad use only 20 minutes on-20 minutes off Continue taking ibuprofen as needed for pain. Return to urgent care if symptoms worsen Final Clinical Impressions(s) / UC Diagnoses   Final diagnoses:  Left sided sciatica     Discharge Instructions      Gentle stretches recommended Take anti-inflammatory agents every 6-8 hours as needed Take medications as prescribed.  Do not drive or operate heavy machinery after taking muscle  relaxants.also relaxants makes you drowsy. Heating pad use on the 20 minutes on-20 minutes off cycle. Return to urgent care if symptoms worsen.   ED Prescriptions     Medication Sig Dispense Auth. Provider   methocarbamol (ROBAXIN) 500 MG tablet Take 1 tablet (500 mg total) by mouth at bedtime as needed for muscle spasms. 20 tablet Saurabh Hettich, Myrene Galas, MD      PDMP not reviewed this encounter.   Chase Picket, MD 06/28/21 1414

## 2021-07-26 ENCOUNTER — Telehealth: Payer: Self-pay | Admitting: Neurology

## 2021-07-26 ENCOUNTER — Ambulatory Visit: Payer: Medicare Other | Admitting: Neurology

## 2021-07-26 NOTE — Telephone Encounter (Signed)
Pt would like to get a note from the Dr in order to return to work. ?

## 2021-07-26 NOTE — Telephone Encounter (Signed)
Patient last seen 06/17/21. He had returned to work at that time but was it was temporary. He had to take leave again due to right-sided weakness (dropping things). Feels ready now to return full time. He works as a Freight forwarder at [employer].  His employer needs him to be cleared. He is asking for an earlier appt. He has been rescheduled to 07/27/21 w/ Frann Rider, NP.  ? ? ?

## 2021-07-27 ENCOUNTER — Encounter: Payer: Self-pay | Admitting: Adult Health

## 2021-07-27 ENCOUNTER — Ambulatory Visit (INDEPENDENT_AMBULATORY_CARE_PROVIDER_SITE_OTHER): Payer: Medicare Other | Admitting: Adult Health

## 2021-07-27 VITALS — BP 158/104 | HR 79 | Ht 68.0 in | Wt 297.0 lb

## 2021-07-27 DIAGNOSIS — I6381 Other cerebral infarction due to occlusion or stenosis of small artery: Secondary | ICD-10-CM | POA: Diagnosis not present

## 2021-07-27 NOTE — Patient Instructions (Addendum)
You are cleared to return back to work at this time ? ?Continue clopidogrel 75 mg daily  and atorvastatin 80 mg daily for secondary stroke prevention ? ?Continue to follow up with PCP regarding cholesterol, blood pressure and blood pressure management  ?Maintain strict control of hypertension with blood pressure goal below 130/90 , diabetes with A1c goal <7 and cholesterol with LDL cholesterol (bad cholesterol) goal below 70 mg/dL.  ? ?Signs of a Stroke? Follow the BEFAST method:  ?Balance Watch for a sudden loss of balance, trouble with coordination or vertigo ?Eyes Is there a sudden loss of vision in one or both eyes? Or double vision?  ?Face: Ask the person to smile. Does one side of the face droop or is it numb?  ?Arms: Ask the person to raise both arms. Does one arm drift downward? Is there weakness or numbness of a leg? ?Speech: Ask the person to repeat a simple phrase. Does the speech sound slurred/strange? Is the person confused ? ?Time: If you observe any of these signs, call 911. ? ? ? ? ? ? ? ? ?Thank you for coming to see [place] at [facility]. I hope we have been able to provide you high quality care today. ? ?You may receive a patient satisfaction survey over the next few weeks. We would appreciate your feedback and comments so that we may continue to improve ourselves and the health of our patients. ? ?

## 2021-07-27 NOTE — Progress Notes (Signed)
?[facility] ?[address] ?[place]. Christopher Roberson [id number] ?[phone] ? ?     OFFICE FOLLOW-UP NOTE ? ?Christopher Roberson ?Date of Birth:  April 15, 1976 ?Medical Record Number:  [id number]  ? ? ?Primary neurologist: Dr. Leonie Roberson ? ? ?CHIEF COMPLAINT: ?Chief Complaint  ?Patient presents with  ? Follow-up  ?  RM 3 Alone ?Pt is well and stable, no new concerns   ?  ? ? ?HPI:  ? ? ?Update 07/27/2021 JM: Patient returns for sooner visit per patient request to be cleared to return back to work. He works as a Freight forwarder at [employer] and feels like he is ready to return back full time. He does still have some weakness in right shoulder - has been seen by ortho and told pinched nerve in neck which is likely causing symptoms. Does have slight weakness in right hand but does not interfere with daily activity, able to write without any difficulty. Denies any residual numbness/tingling. Denies any RLE weakness. Denies new stroke/TIA symptoms.  Compliant on Plavix and atorvastatin, denies side effects.  Blood pressure today 158/104. Reports elevated blood pressure more recently - he is waiting to receive losartan as he recently ran out of it. Does monitor BG at home and typically 110-130s. No new concerns at this time.  ? ? ? ? ?History provided for reference purposes only ?Initial visit 06/17/2021 Dr. Leonie Roberson: Christopher Roberson is a 46 year old African-American male seen today for initial office follow-up visit following hospital admission for stroke in January 2023.Marland Kitchen  History is obtained from the patient and review of electronic medical records and opossum reviewed available pertinent imaging films in PACS.  He has past medical history significant for diabetes, pretension, obesity, sleep apnea who presented on 04/28/2021 with 2-day history of right upper extremity weakness numbness and facial droop.  He states he was sitting on his table at home when symptoms began.  He has a prior history of Bell's palsy on the left side in 2020  but his facial droop he felt was returning almost back to baseline but he felt he was numb on the off of his face and also complained of right shoulder and elbow pain and tenderness to palpation.  On exam he was found to have some subjective right upper extremity weakness and numbness with some facial droop and NIH stroke score of 2.  He was not considered for TNK as he presented outside the window.  CT angiogram of the brain and neck showed no significant large vessel stenosis or occlusion.  MRI scan showed a small subacute left corona radiator lacunar infarct.  Echocardiogram showed normal ejection fraction without cardiac source of embolism.  Urine drug screen was negative.  LDL cholesterol was elevated at 115 mg percent and hemoglobin A1c at 10.8.  He was started on dual antiplatelet therapy aspirin and Plavix for 3 weeks followed by Plavix alone.  Patient states is done well since discharge he is however still have some right grip and hand weakness.  Right neck and shoulder pain continues to bother him which limits his shoulder movements.  He still have some subjective numbness on the right side but this involves the whole face as well as the leg as well.  His right leg strength he feels is much improved and he can walk better.  He is remains on Plavix alone which is tolerating well without bruising or bleeding.  He states he does not check his blood pressure at home but it is elevated today in office  at 162/101.  Is tolerating Lipitor 80 mg well without muscle aches and pains.  He does have a sleep apnea machine but feels like he needs a new one and will talk to his physician about this.  He has returned back to work temporarily but feels that he is not doing well and may need to go out again.  He is not getting any therapies at present.  He has not been exercising regularly and has not lost any weight ? ? ? ?ROS:   ?14 system review of systems is positive for those listed in HPI and all other systems  negative ? ?PMH:  ?Past Medical History:  ?Diagnosis Date  ? Diabetes mellitus   ? Hypertension   ? Stroke (HCC)   ? ? ?Social History:  ?Social History  ? ?Socioeconomic History  ? Marital status: Married  ?  Spouse name: Not on file  ? Number of children: Not on file  ? Years of education: Not on file  ? Highest education level: Not on file  ?Occupational History  ? Not on file  ?Tobacco Use  ? Smoking status: Never  ? Smokeless tobacco: Never  ?Vaping Use  ? Vaping Use: Never used  ?Substance and Sexual Activity  ? Alcohol use: No  ? Drug use: No  ? Sexual activity: Not on file  ?Other Topics Concern  ? Not on file  ?Social History Narrative  ? Not on file  ? ?Social Determinants of Health  ? ?Financial Resource Strain: Low Risk   ? Difficulty of Paying Living Expenses: Not hard at all  ?Food Insecurity: No Food Insecurity  ? Worried About Running Out of Food in the Last Year: Never true  ? Ran Out of Food in the Last Year: Never true  ?Transportation Needs: No Transportation Needs  ? Lack of Transportation (Medical): No  ? Lack of Transportation (Non-Medical): No  ?Physical Activity: Not on file  ?Stress: No Stress Concern Present  ? Feeling of Stress : Only a little  ?Social Connections: Not on file  ?Intimate Partner Violence: Not At Risk  ? Fear of Current or Ex-Partner: No  ? Emotionally Abused: No  ? Physically Abused: No  ? Sexually Abused: No  ? ? ?Medications:   ?Current Outpatient Medications on File Prior to Visit  ?Medication Sig Dispense Refill  ? albuterol (VENTOLIN HFA) 108 (90 Base) MCG/ACT inhaler Inhale 2 puffs into the lungs every 4 (four) hours as needed for wheezing or shortness of breath.    ? amLODipine (NORVASC) 5 MG tablet Take 5 mg by mouth daily.    ? atorvastatin (LIPITOR) 80 MG tablet Take 1 tablet (80 mg total) by mouth every morning. 30 tablet 1  ? blood glucose meter kit and supplies KIT Dispense based on patient and insurance preference. Use up to four times daily as directed. 1  each 0  ? carvedilol (COREG) 25 MG tablet Take 25 mg by mouth 2 (two) times daily.    ? chlorthalidone (HYGROTON) 25 MG tablet Take 25 mg by mouth daily.    ? clopidogrel (PLAVIX) 75 MG tablet Take 1 tablet (75 mg total) by mouth daily. 30 tablet 3  ? dapagliflozin propanediol (FARXIGA) 5 MG TABS tablet Take 5 mg by mouth daily.    ? insulin glargine (LANTUS SOLOSTAR) 100 UNIT/ML Solostar Pen Take 30units every morning and 45units every night 15 mL 11  ? Insulin Pen Needle 32G X 4 MM MISC Use as directed 100 each   2  ? lisinopril (ZESTRIL) 40 MG tablet Take 1 tablet (40 mg total) by mouth every morning. Resume in  1 week (Patient taking differently: Take 40 mg by mouth daily.)    ? losartan (COZAAR) 50 MG tablet Take 50 mg by mouth daily.    ? metFORMIN (GLUCOPHAGE) 1000 MG tablet Take 1 tablet (1,000 mg total) by mouth 2 (two) times daily with a meal. 60 tablet 2  ? methocarbamol (ROBAXIN) 500 MG tablet Take 1 tablet (500 mg total) by mouth at bedtime as needed for muscle spasms. 20 tablet 0  ? metoprolol succinate (TOPROL-XL) 50 MG 24 hr tablet Take 1 tablet (50 mg total) by mouth daily. 30 tablet 2  ? olmesartan (BENICAR) 20 MG tablet Take 20 mg by mouth daily.    ? pregabalin (LYRICA) 150 MG capsule Take 150 mg by mouth 2 (two) times daily as needed (pain).    ? Semaglutide,0.25 or 0.5MG/DOS, (OZEMPIC, 0.25 OR 0.5 MG/DOSE,) 2 MG/1.5ML SOPN Inject 0.25 mg into the skin every Monday.    ? sertraline (ZOLOFT) 50 MG tablet Take 50 mg by mouth daily.    ? spironolactone (ALDACTONE) 25 MG tablet Take 25 mg by mouth daily.    ? SURE COMFORT PEN NEEDLES 31G X 8 MM MISC See admin instructions.    ? terazosin (HYTRIN) 5 MG capsule Take 5 mg by mouth at bedtime.    ? traZODone (DESYREL) 100 MG tablet Take 100-300 mg by mouth at bedtime as needed for sleep.    ? ?No current facility-administered medications on file prior to visit.  ? ? ?Allergies:   ?Allergies  ?Allergen Reactions  ? Percocet [Oxycodone-Acetaminophen]  Itching  ?  Can take if takes with Benadryl  ? ? ?Physical Exam ?Today's Vitals  ? 07/27/21 1246  ?BP: (!) 158/104  ?Pulse: 79  ?Weight: 297 lb (134.7 kg)  ?Height: 5' 8" (1.727 m)  ? ?Body mass index is 45.16 kg/m?Marland Kitchen

## 2021-08-03 ENCOUNTER — Encounter: Payer: Self-pay | Admitting: Gastroenterology

## 2021-08-20 ENCOUNTER — Ambulatory Visit (INDEPENDENT_AMBULATORY_CARE_PROVIDER_SITE_OTHER): Payer: Medicare Other | Admitting: Gastroenterology

## 2021-08-20 ENCOUNTER — Telehealth: Payer: Self-pay

## 2021-08-20 ENCOUNTER — Encounter: Payer: Self-pay | Admitting: Gastroenterology

## 2021-08-20 VITALS — BP 140/90 | HR 66 | Ht 68.0 in | Wt 297.4 lb

## 2021-08-20 DIAGNOSIS — Z8673 Personal history of transient ischemic attack (TIA), and cerebral infarction without residual deficits: Secondary | ICD-10-CM | POA: Diagnosis not present

## 2021-08-20 DIAGNOSIS — Z1212 Encounter for screening for malignant neoplasm of rectum: Secondary | ICD-10-CM

## 2021-08-20 DIAGNOSIS — R112 Nausea with vomiting, unspecified: Secondary | ICD-10-CM | POA: Diagnosis not present

## 2021-08-20 DIAGNOSIS — Z1211 Encounter for screening for malignant neoplasm of colon: Secondary | ICD-10-CM

## 2021-08-20 DIAGNOSIS — Z800 Family history of malignant neoplasm of digestive organs: Secondary | ICD-10-CM | POA: Diagnosis not present

## 2021-08-20 MED ORDER — SUTAB 1479-225-188 MG PO TABS: 1.0000 | ORAL_TABLET | Freq: Once | ORAL | 0 refills | Status: AC

## 2021-08-20 NOTE — Progress Notes (Signed)
? ?HPI : Christopher Roberson is a very pleasant 46 year old male with a history of diabetes, hypertension, obesity and recent small lacunar stroke in January 2023 who is referred to [place] by Arthur Holms, NP for colon cancer screening.  The patient presented with stroke symptoms consisting of right-sided weakness and facial droop.  He was treated with aspirin and Plavix initially, and aspirin therapy was subsequently discontinued.  He continues on Plavix monotherapy for secondary stroke prevention.  He has some mild residual right-sided weakness/difficulty using right hand, but otherwise no permanent neurologic defects.  He has been cleared to return to work by his neurologist and does not require any further work-up. ? ?He has poorly controlled diabetes, diagnosed about 8 years ago.  His A1c in January was over 10.  His father was diagnosed with colon cancer in his early 22s.  His father's brother was also diagnosed with colon cancer in his 17s.  His paternal grandparents both died from cancer, but he does not know what type of cancer that he had. ?He has never had a colonoscopy before. ? ?He denies any chronic lower GI symptoms.  He has regular bowel movements with formed, solid stool.  No blood in the stool.  He does report vomiting episodes which occur exclusively with fried or fatty foods.  He does not have vomiting in the absence of these types of foods.  He describes these vomiting episodes as sudden onset of nausea and hypersalivation then resulting in retching and vomiting the food he just ate.  This typically happens within 15 minutes of eating a fatty meal.  Following the bout of emesis, he feels fine.  He denies symptoms of heartburn or acid regurgitation on a regular basis.  He denies nocturnal regurgitation.  He denies abdominal pain with these episodes.  On a day-to-day basis, he denies any problems with nausea or vomiting.  No dysphagia. ?His weight is stable. ? ? ?Past Medical History:  ?Diagnosis Date  ?  Diabetes mellitus   ? Hypertension   ? Stroke [facility])   ? ? ? ?Past Surgical History:  ?Procedure Laterality Date  ? APPENDECTOMY    ? CARPAL TUNNEL RELEASE    ? HERNIA REPAIR    ? ?Family History  ?Problem Relation Age of Onset  ? Heart disease Mother   ? ?Social History  ? ?Tobacco Use  ? Smoking status: Never  ? Smokeless tobacco: Never  ?Vaping Use  ? Vaping Use: Never used  ?Substance Use Topics  ? Alcohol use: No  ? Drug use: No  ? ?Current Outpatient Medications  ?Medication Sig Dispense Refill  ? albuterol (VENTOLIN HFA) 108 (90 Base) MCG/ACT inhaler Inhale 2 puffs into the lungs every 4 (four) hours as needed for wheezing or shortness of breath.    ? amLODipine (NORVASC) 5 MG tablet Take 5 mg by mouth daily.    ? atorvastatin (LIPITOR) 80 MG tablet Take 1 tablet (80 mg total) by mouth every morning. 30 tablet 1  ? blood glucose meter kit and supplies KIT Dispense based on patient and insurance preference. Use up to four times daily as directed. 1 each 0  ? carvedilol (COREG) 25 MG tablet Take 25 mg by mouth 2 (two) times daily.    ? chlorthalidone (HYGROTON) 25 MG tablet Take 25 mg by mouth daily.    ? clopidogrel (PLAVIX) 75 MG tablet Take 1 tablet (75 mg total) by mouth daily. 30 tablet 3  ? dapagliflozin propanediol (FARXIGA) 5 MG TABS tablet  Take 5 mg by mouth daily.    ? insulin glargine (LANTUS SOLOSTAR) 100 UNIT/ML Solostar Pen Take 30units every morning and 45units every night 15 mL 11  ? Insulin Pen Needle 32G X 4 MM MISC Use as directed 100 each 2  ? lisinopril (ZESTRIL) 40 MG tablet Take 1 tablet (40 mg total) by mouth every morning. Resume in  1 week (Patient taking differently: Take 40 mg by mouth daily.)    ? losartan (COZAAR) 50 MG tablet Take 50 mg by mouth daily.    ? metFORMIN (GLUCOPHAGE) 1000 MG tablet Take 1 tablet (1,000 mg total) by mouth 2 (two) times daily with a meal. 60 tablet 2  ? methocarbamol (ROBAXIN) 500 MG tablet Take 1 tablet (500 mg total) by mouth at bedtime as needed for  muscle spasms. 20 tablet 0  ? metoprolol succinate (TOPROL-XL) 50 MG 24 hr tablet Take 1 tablet (50 mg total) by mouth daily. 30 tablet 2  ? olmesartan (BENICAR) 20 MG tablet Take 20 mg by mouth daily.    ? pregabalin (LYRICA) 150 MG capsule Take 150 mg by mouth 2 (two) times daily as needed (pain).    ? Semaglutide,0.25 or 0.5MG/DOS, (OZEMPIC, 0.25 OR 0.5 MG/DOSE,) 2 MG/1.5ML SOPN Inject 0.25 mg into the skin every Monday.    ? sertraline (ZOLOFT) 50 MG tablet Take 50 mg by mouth daily.    ? spironolactone (ALDACTONE) 25 MG tablet Take 25 mg by mouth daily.    ? SURE COMFORT PEN NEEDLES 31G X 8 MM MISC See admin instructions.    ? terazosin (HYTRIN) 5 MG capsule Take 5 mg by mouth at bedtime.    ? traZODone (DESYREL) 100 MG tablet Take 100-300 mg by mouth at bedtime as needed for sleep.    ? ?No current facility-administered medications for this visit.  ? ?Allergies  ?Allergen Reactions  ? Percocet [Oxycodone-Acetaminophen] Itching  ?  Can take if takes with Benadryl  ? ? ? ?Review of Systems: ?All systems reviewed and negative except where noted in HPI.  ? ? ?No results found. ? ?Physical Exam: ?There were no vitals taken for this visit. ?Constitutional: Pleasant,well-developed, African-American male in no acute distress. ?HEENT: Normocephalic and atraumatic. Conjunctivae are normal. No scleral icterus. ?Neck supple.  ?Cardiovascular: Normal rate, regular rhythm.  ?Pulmonary/chest: Effort normal and breath sounds normal. No wheezing, rales or rhonchi. ?Abdominal: Soft, obese, nondistended, nontender. Bowel sounds active throughout. There are no masses palpable. No hepatomegaly. ?Extremities: no edema ?Neurological: Alert and oriented to person place and time. ?Skin: Skin is warm and dry. No rashes noted. ?Psychiatric: Normal mood and affect. Behavior is normal. ? ?CBC ?   ?Component Value Date/Time  ? WBC 7.2 04/30/2021 0023  ? RBC 4.34 04/30/2021 0023  ? HGB 13.3 04/30/2021 0023  ? HCT 38.3 (L) 04/30/2021 0023  ?  PLT 235 04/30/2021 0023  ? MCV 88.2 04/30/2021 0023  ? MCH 30.6 04/30/2021 0023  ? MCHC 34.7 04/30/2021 0023  ? RDW 11.7 04/30/2021 0023  ? LYMPHSABS 1.4 10/08/2018 1915  ? MONOABS 0.5 10/08/2018 1915  ? EOSABS 0.0 10/08/2018 1915  ? BASOSABS 0.0 10/08/2018 1915  ? ? ?CMP  ?   ?Component Value Date/Time  ? NA 134 (L) 04/30/2021 0023  ? K 3.6 04/30/2021 0023  ? CL 103 04/30/2021 0023  ? CO2 25 04/30/2021 0023  ? GLUCOSE 284 (H) 04/30/2021 0023  ? BUN 11 04/30/2021 0023  ? CREATININE 0.94 04/30/2021 0023  ? CALCIUM  8.6 (L) 04/30/2021 0023  ? PROT 7.4 04/28/2021 2033  ? ALBUMIN 3.8 04/28/2021 2033  ? AST 24 04/28/2021 2033  ? ALT 20 04/28/2021 2033  ? ALKPHOS 60 04/28/2021 2033  ? BILITOT 0.9 04/28/2021 2033  ? GFRNONAA >60 04/30/2021 0023  ? GFRAA >60 10/10/2018 0330  ? ? ? ?ASSESSMENT AND PLAN: ?46 year old male with recent small lacunar stroke in January 2023 with some mild residual right upper extremity weakness, on Plavix overdue for initial screening colonoscopy.  He is at increased risk for colon cancer given his family history (father, paternal uncle, possible grandparents).  I would not recommend stool based screening for him given his increased risk. ?Because of his recent stroke, I recommend waiting 6 months from a stroke to perform his colonoscopy.  Ideally, we would like to hold his Plavix for 5 days before his colonoscopy in the event there is a large polyp that needs to be removed.  I would like neurology's input on whether this is a reasonable risk 6 months out from a stroke. ?We will schedule him tentatively for a colonoscopy in late July.  We will message his neurologist to see if there are strong objections to holding his Plavix for 5 days. ? ?His vomiting episodes following fatty meals is interesting and possibly suggestive of an element of gastroparesis.  Given that he denies symptoms most the time, I am less concerned about etiologies such as peptic ulcer disease or peptic stricture/gastric outlet  obstruction.  The absence of any abdominal pain makes gallstones very unlikely.  Reflux is also possible, but seems less likely given his absence of any heartburn or acid regurgitation. ?As he only has

## 2021-08-20 NOTE — Patient Instructions (Signed)
If you are age 47 or older, your body mass index should be between 23-30. Your Body mass index is 45.22 kg/m?Marland Kitchen If this is out of the aforementioned range listed, please consider follow up with your Primary Care Provider. ? ?If you are age 57 or younger, your body mass index should be between 19-25. Your Body mass index is 45.22 kg/m?Marland Kitchen If this is out of the aformentioned range listed, please consider follow up with your Primary Care Provider.  ? ?You have been scheduled for a colonoscopy. Please follow written instructions given to you at your visit today.  ?Please pick up your prep supplies at the pharmacy within the next 1-3 days. ?If you use inhalers (even only as needed), please bring them with you on the day of your procedure. ? ?________________________________________________________ ? ?The [facility] providers would like to encourage you to use [facility] to communicate with providers for non-urgent requests or questions.  Due to long hold times on the telephone, sending your provider a message by [facility] may be a faster and more efficient way to get a response.  Please allow 48 business hours for a response.  Please remember that this is for non-urgent requests.  ?_______________________________________________________ ? ?It was a pleasure to see you today! ? ?Thank you for trusting me with your gastrointestinal care!   ? ?Scott E.Candis Schatz, MD  ? ?

## 2021-08-20 NOTE — Telephone Encounter (Signed)
? ?  [employer] ?10-25-1975 ?[id number] ? ?Dear Frann Rider , NP : ? ?We have scheduled the above named patient for a(n) Colonoscopy procedure. Our records show that (s)he is on anticoagulation therapy. ? ?Please advise as to whether the patient may come off their therapy of plavixs  5 days prior to their procedure which is scheduled for 11/18/21. ? ?Please route your response to [facility] or fax response to [phone]. ? ?Sincerely, ? ? ? ?[facility] ? ? ?

## 2021-08-23 NOTE — Telephone Encounter (Signed)
Patient suffered a stroke in 04/2021. As he has been doing well since then without any new strokes or stroke/TIA symptoms, he can proceed with scheduled colonoscopy in 10/2021 as that will be 6 months post stroke as long as he remains stable during the interval time. Okay to hold Plavix 5 days prior to procedure with small but acceptable risk of recurrent stroke while off Plavix therapy.  Recommend restarting Plavix immediately after or once hemodynamically stable. ?

## 2021-08-24 NOTE — Telephone Encounter (Signed)
Left VM for patient to return call regarding holding plavix 5 days. ?

## 2021-09-03 NOTE — Telephone Encounter (Signed)
2nd attempt to call patient. Left VM to let patient know to hold plavix 5 days prior to his procedure. ?

## 2021-09-10 NOTE — Telephone Encounter (Signed)
Called patient and let him know to hold Plavix 5 days prior his procedure. Patient stated understanding and had no questions at the end of called.

## 2021-10-04 ENCOUNTER — Ambulatory Visit: Payer: Medicare Other | Admitting: Adult Health

## 2021-11-09 ENCOUNTER — Telehealth: Payer: Self-pay

## 2021-11-09 NOTE — Telephone Encounter (Signed)
Signed and placed in outbox.  Thank you. ?

## 2021-11-09 NOTE — Telephone Encounter (Signed)
Surgical clearance for cataract extraction received from [facility], placed on NP desk for review.

## 2021-11-10 NOTE — Telephone Encounter (Signed)
Surgical clearance faxed to [facility], received confirmation.  Per NP: cleared from neurological standpoint. Patient should be on Plavix. If needed, can hold 3-5 days prior with small but acceptable risk of stroke while off. Restart immediately after or once stable. Letter sent to medical records to be scanned.

## 2021-11-18 ENCOUNTER — Encounter: Payer: Self-pay | Admitting: Gastroenterology

## 2021-11-18 ENCOUNTER — Ambulatory Visit (AMBULATORY_SURGERY_CENTER): Payer: Medicare Other | Admitting: Gastroenterology

## 2021-11-18 VITALS — BP 146/93 | HR 66 | Temp 98.4°F | Resp 20 | Ht 68.0 in | Wt 297.0 lb

## 2021-11-18 DIAGNOSIS — D123 Benign neoplasm of transverse colon: Secondary | ICD-10-CM

## 2021-11-18 DIAGNOSIS — Z1212 Encounter for screening for malignant neoplasm of rectum: Secondary | ICD-10-CM | POA: Diagnosis not present

## 2021-11-18 DIAGNOSIS — K635 Polyp of colon: Secondary | ICD-10-CM

## 2021-11-18 DIAGNOSIS — Z800 Family history of malignant neoplasm of digestive organs: Secondary | ICD-10-CM | POA: Diagnosis not present

## 2021-11-18 DIAGNOSIS — K6389 Other specified diseases of intestine: Secondary | ICD-10-CM | POA: Diagnosis not present

## 2021-11-18 DIAGNOSIS — Z1211 Encounter for screening for malignant neoplasm of colon: Secondary | ICD-10-CM

## 2021-11-18 MED ORDER — SODIUM CHLORIDE 0.9 % IV SOLN: 500.0000 mL | Freq: Once | INTRAVENOUS | Status: DC

## 2021-11-18 NOTE — Op Note (Signed)
[facility] Patient Name: Christopher Roberson Procedure Date: 11/18/2021 7:52 AM MRN: [id number] Endoscopist: Nicki Reaper E. Candis Schatz , MD Age: 46 Referring MD:  Date of Birth: [birth year] Gender: Male Account #: [id number] Procedure:                Colonoscopy Indications:              Screening in patient at increased risk: Colorectal                            cancer in father 54 or older Medicines:                Monitored Anesthesia Care Procedure:                Pre-Anesthesia Assessment:                           - Prior to the procedure, a History and Physical                            was performed, and patient medications and                            allergies were reviewed. The patient's tolerance of                            previous anesthesia was also reviewed. The risks                            and benefits of the procedure and the sedation                            options and risks were discussed with the patient.                            All questions were answered, and informed consent                            was obtained. Prior Anticoagulants: The patient has                            taken Plavix (clopidogrel), last dose was 13 days                            prior to procedure (the patient had a cataract                            surgery on July 19th). ASA Grade Assessment: III -                            A patient with severe systemic disease. After                            reviewing the risks and benefits, the patient was  deemed in satisfactory condition to undergo the                            procedure.                           After obtaining informed consent, the colonoscope                            was passed under direct vision. Throughout the                            procedure, the patient's blood pressure, pulse, and                            oxygen saturations were monitored continuously. The                             CF [id number] #[id number] was introduced through the anus                            and advanced to the the cecum, identified by                            appendiceal orifice and ileocecal valve. The                            colonoscopy was performed without difficulty. The                            patient tolerated the procedure well. The quality                            of the bowel preparation was good. The ileocecal                            valve, appendiceal orifice, and rectum were                            photographed. The bowel preparation used was Sutab                            via split dose instruction. Scope In: 8:02:00 AM Scope Out: 8:17:53 AM Scope Withdrawal Time: 0 hours 10 minutes 23 seconds  Total Procedure Duration: 0 hours 15 minutes 53 seconds  Findings:                 The perianal and digital rectal examinations were                            normal. Pertinent negatives include normal                            sphincter tone and no palpable rectal lesions.  A 5 mm polyp was found in the transverse colon. The                            polyp was sessile. The polyp was removed with a                            cold snare. Resection and retrieval were complete.                            Estimated blood loss was minimal.                           The exam was otherwise normal throughout the                            examined colon.                           The retroflexed view of the distal rectum and anal                            verge was normal and showed no anal or rectal                            abnormalities. Complications:            No immediate complications. Estimated Blood Loss:     Estimated blood loss was minimal. Impression:               - One 5 mm polyp in the transverse colon, removed                            with a cold snare. Resected and retrieved.                           - The distal  rectum and anal verge are normal on                            retroflexion view. Recommendation:           - Patient has a contact number available for                            emergencies. The signs and symptoms of potential                            delayed complications were discussed with the                            patient. Return to normal activities tomorrow.                            Written discharge instructions were provided to the  patient.                           - Resume previous diet.                           - Resume Plavix (clopidogrel) at prior dose in 2                            days (July 29th).                           - Await pathology results.                           - Repeat colonoscopy (date not yet determined) for                            surveillance based on pathology results. [name] E. Candis Schatz, MD 11/18/2021 8:27:40 AM This report has been signed electronically.

## 2021-11-18 NOTE — Patient Instructions (Signed)
Handout given about polyps.  Resume previous diet.   Resume PLAVIX at prior dose in 2 days (July 29 th).Await pathology results.  Repeat colonoscopy (date not yet determined) for surveillance based on pathology results.   YOU HAD AN ENDOSCOPIC PROCEDURE TODAY AT [facility]:   Refer to the procedure report that was given to you for any specific questions about what was found during the examination.  If the procedure report does not answer your questions, please call your gastroenterologist to clarify.  If you requested that your care partner not be given the details of your procedure findings, then the procedure report has been included in a sealed envelope for you to review at your convenience later.  YOU SHOULD EXPECT: Some feelings of bloating in the abdomen. Passage of more gas than usual.  Walking can help get rid of the air that was put into your GI tract during the procedure and reduce the bloating. If you had a lower endoscopy (such as a colonoscopy or flexible sigmoidoscopy) you may notice spotting of blood in your stool or on the toilet paper. If you underwent a bowel prep for your procedure, you may not have a normal bowel movement for a few days.  Please Note:  You might notice some irritation and congestion in your nose or some drainage.  This is from the oxygen used during your procedure.  There is no need for concern and it should clear up in a day or so.  SYMPTOMS TO REPORT IMMEDIATELY:  Following lower endoscopy (colonoscopy or flexible sigmoidoscopy):  Excessive amounts of blood in the stool  Significant tenderness or worsening of abdominal pains  Swelling of the abdomen that is new, acute  Fever of 100F or higher  For urgent or emergent issues, a gastroenterologist can be reached at any hour by calling [phone]. Do not use MyChart messaging for urgent concerns.    DIET:  We do recommend a small meal at first, but then you may proceed to your regular  diet.  Drink plenty of fluids but you should avoid alcoholic beverages for 24 hours.  ACTIVITY:  You should plan to take it easy for the rest of today and you should NOT DRIVE or use heavy machinery until tomorrow (because of the sedation medicines used during the test).    FOLLOW UP: Our staff will call the number listed on your records the next business day following your procedure.  We will call around 7:15- 8:00 am to check on you and address any questions or concerns that you may have regarding the information given to you following your procedure. If we do not reach you, we will leave a message.  If you develop any symptoms (ie: fever, flu-like symptoms, shortness of breath, cough etc.) before then, please call [phone].  If you test positive for Covid 19 in the 2 weeks post procedure, please call and report this information to [place].    If any biopsies were taken you will be contacted by phone or by letter within the next 1-3 weeks.  Please call us at [phone] if you have not heard about the biopsies in 3 weeks.    SIGNATURES/CONFIDENTIALITY: You and/or your care partner have signed paperwork which will be entered into your electronic medical record.  These signatures attest to the fact that that the information above on your After Visit Summary has been reviewed and is understood.  Full responsibility of the confidentiality of this discharge information lies with  you and/or your care-partner.

## 2021-11-18 NOTE — Progress Notes (Signed)
[facility] History and Physical   Primary Care Physician:  [name], FNP   Reason for Procedure:   Colon cancer screening, high risk  Plan:    Colonoscopy     HPI: Christopher Roberson is a 46 y.o. male undergoing initial screening colonoscopy.  His father was diagnosed with colon cancer in his 20s and his paternal uncle was diagnosed with colon cancer in his 51s. He has no chronic lower GI symptoms.  He takes Plavix for secondary stroke prevention.  He last look Plavix on July 14th because he had cataract surgery on July 19th.     Past Medical History:  Diagnosis Date   Depression    Diabetes mellitus    Hypertension    Obesity    Sleep apnea    On CPAP machine   Stroke [facility])     Past Surgical History:  Procedure Laterality Date   APPENDECTOMY     CARPAL TUNNEL RELEASE     ESOPHAGOGASTRODUODENOSCOPY     [facility] around 2015-2016   HERNIA REPAIR      Prior to Admission medications   Medication Sig Start Date End Date Taking? Authorizing Provider  amLODipine (NORVASC) 5 MG tablet Take 5 mg by mouth daily. 03/31/21  Yes [provider]  atorvastatin (LIPITOR) 80 MG tablet Take 1 tablet (80 mg total) by mouth every morning. 04/30/21  Yes [name], MD  carvedilol (COREG) 25 MG tablet Take 25 mg by mouth 2 (two) times daily. 05/10/21  Yes [provider]  chlorthalidone (HYGROTON) 25 MG tablet Take 25 mg by mouth daily. 11/25/20  Yes [provider]  dapagliflozin propanediol (FARXIGA) 5 MG TABS tablet Take 5 mg by mouth daily. 08/13/20  Yes [provider]  losartan (COZAAR) 50 MG tablet Take 50 mg by mouth daily. 05/31/21  Yes [provider]  metFORMIN (GLUCOPHAGE) 1000 MG tablet Take 1 tablet (1,000 mg total) by mouth 2 (two) times daily with a meal. 04/30/21  Yes [name], MD  metoprolol succinate (TOPROL-XL) 50 MG 24 hr tablet Take 1 tablet (50 mg total) by mouth daily. 04/30/21  Yes [name], MD  olmesartan (BENICAR) 20 MG tablet Take 20 mg by mouth daily. 11/25/20  Yes [provider]  spironolactone (ALDACTONE) 25 MG tablet Take 25 mg by mouth daily. 06/24/20  Yes [provider]  terazosin (HYTRIN) 5 MG capsule Take 5 mg by mouth at bedtime. 05/30/18  Yes [provider]  albuterol (VENTOLIN HFA) 108 (90 Base) MCG/ACT inhaler Inhale 2 puffs into the lungs every 4 (four) hours as needed for wheezing or shortness of breath. 03/12/16   [provider]  blood glucose meter kit and supplies KIT Dispense based on patient and insurance preference. Use up to four times daily as directed. 04/30/21   [name], MD  clopidogrel (PLAVIX) 75 MG tablet Take 1 tablet (75 mg total) by mouth daily. 05/01/21   [name], MD  insulin glargine (LANTUS SOLOSTAR) 100 UNIT/ML Solostar Pen Take 30units every morning and 45units every night Patient not taking: Reported on 11/18/2021 04/30/21   [name], MD  Insulin Pen Needle 32G X 4 MM MISC Use as directed 04/30/21   [name], MD  lisinopril (ZESTRIL) 40 MG tablet Take 1 tablet (40 mg total) by mouth every morning. Resume in  1 week Patient not taking: Reported on 08/20/2021 10/10/18   [name], [name], MD  methocarbamol (ROBAXIN) 500 MG tablet Take 1  tablet (500 mg total) by mouth at bedtime as needed for muscle spasms. 06/25/21   Lamptey, Myrene Galas, MD  pregabalin (LYRICA) 150 MG capsule Take 150 mg by mouth 2 (two) times daily as needed (pain). 09/18/18   [provider]  Semaglutide,0.25 or 0.5MG/DOS, (OZEMPIC, 0.25 OR 0.5 MG/DOSE,) 2 MG/1.5ML SOPN Inject 0.25 mg into the skin every Monday. 08/20/18   [provider]  sertraline (ZOLOFT) 50 MG tablet Take 50 mg by mouth daily. 08/01/20   [provider]  SURE COMFORT PEN NEEDLES 31G X 8 MM MISC See admin instructions. 05/05/21   [provider]  traZODone (DESYREL) 100 MG tablet Take 100-300 mg by mouth at bedtime as  needed for sleep. 07/26/18   [provider]    Current Outpatient Medications  Medication Sig Dispense Refill   amLODipine (NORVASC) 5 MG tablet Take 5 mg by mouth daily.     atorvastatin (LIPITOR) 80 MG tablet Take 1 tablet (80 mg total) by mouth every morning. 30 tablet 1   carvedilol (COREG) 25 MG tablet Take 25 mg by mouth 2 (two) times daily.     chlorthalidone (HYGROTON) 25 MG tablet Take 25 mg by mouth daily.     dapagliflozin propanediol (FARXIGA) 5 MG TABS tablet Take 5 mg by mouth daily.     losartan (COZAAR) 50 MG tablet Take 50 mg by mouth daily.     metFORMIN (GLUCOPHAGE) 1000 MG tablet Take 1 tablet (1,000 mg total) by mouth 2 (two) times daily with a meal. 60 tablet 2   metoprolol succinate (TOPROL-XL) 50 MG 24 hr tablet Take 1 tablet (50 mg total) by mouth daily. 30 tablet 2   olmesartan (BENICAR) 20 MG tablet Take 20 mg by mouth daily.     spironolactone (ALDACTONE) 25 MG tablet Take 25 mg by mouth daily.     terazosin (HYTRIN) 5 MG capsule Take 5 mg by mouth at bedtime.     albuterol (VENTOLIN HFA) 108 (90 Base) MCG/ACT inhaler Inhale 2 puffs into the lungs every 4 (four) hours as needed for wheezing or shortness of breath.     blood glucose meter kit and supplies KIT Dispense based on patient and insurance preference. Use up to four times daily as directed. 1 each 0   clopidogrel (PLAVIX) 75 MG tablet Take 1 tablet (75 mg total) by mouth daily. 30 tablet 3   insulin glargine (LANTUS SOLOSTAR) 100 UNIT/ML Solostar Pen Take 30units every morning and 45units every night (Patient not taking: Reported on 11/18/2021) 15 mL 11   Insulin Pen Needle 32G X 4 MM MISC Use as directed 100 each 2   lisinopril (ZESTRIL) 40 MG tablet Take 1 tablet (40 mg total) by mouth every morning. Resume in  1 week (Patient not taking: Reported on 08/20/2021)     methocarbamol (ROBAXIN) 500 MG tablet Take 1 tablet (500 mg total) by mouth at bedtime as needed for muscle spasms. 20 tablet 0    pregabalin (LYRICA) 150 MG capsule Take 150 mg by mouth 2 (two) times daily as needed (pain).     Semaglutide,0.25 or 0.5MG/DOS, (OZEMPIC, 0.25 OR 0.5 MG/DOSE,) 2 MG/1.5ML SOPN Inject 0.25 mg into the skin every Monday.     sertraline (ZOLOFT) 50 MG tablet Take 50 mg by mouth daily.     SURE COMFORT PEN NEEDLES 31G X 8 MM MISC See admin instructions.     traZODone (DESYREL) 100 MG tablet Take 100-300 mg by mouth at bedtime as  needed for sleep.     Current Facility-Administered Medications  Medication Dose Route Frequency Provider Last Rate Last Admin   0.9 %  sodium chloride infusion  500 mL Intravenous Once Daryel November, MD        Allergies as of 11/18/2021 - Review Complete 11/18/2021  Allergen Reaction Noted   Percocet [oxycodone-acetaminophen] Itching 01/05/2012    Family History  Problem Relation Age of Onset   Heart disease Mother    Colon cancer Father    Colon cancer Paternal Uncle    Prostate cancer Paternal Uncle    Prostate cancer Paternal Uncle    Cancer Paternal Grandmother    Prostate cancer Paternal Grandfather    Esophageal cancer Neg Hx    Rectal cancer Neg Hx    Stomach cancer Neg Hx     Social History   Socioeconomic History   Marital status: Married    Spouse name: Not on file   Number of children: Not on file   Years of education: Not on file   Highest education level: Not on file  Occupational History   Not on file  Tobacco Use   Smoking status: Never   Smokeless tobacco: Never  Vaping Use   Vaping Use: Never used  Substance and Sexual Activity   Alcohol use: No   Drug use: Never   Sexual activity: Not on file  Other Topics Concern   Not on file  Social History Narrative   Not on file   Social Determinants of Health   Financial Resource Strain: Low Risk  (05/20/2021)   Overall Financial Resource Strain (CARDIA)    Difficulty of Paying Living Expenses: Not hard at all  Food Insecurity: No Food Insecurity (05/31/2021)   Hunger Vital  Sign    Worried About Running Out of Food in the Last Year: Never true    [place] in the Last Year: Never true  Transportation Needs: No Transportation Needs (05/31/2021)   PRAPARE - Hydrologist (Medical): No    Lack of Transportation (Non-Medical): No  Physical Activity: Not on file  Stress: No Stress Concern Present (05/31/2021)   [place]    Feeling of Stress : Only a little  Social Connections: Not on file  Intimate Partner Violence: Not At Risk (05/20/2021)   Humiliation, Afraid, Rape, and Kick questionnaire    Fear of Current or Ex-Partner: No    Emotionally Abused: No    Physically Abused: No    Sexually Abused: No    Review of Systems:  All other review of systems negative except as mentioned in the HPI.  Physical Exam: Vital signs BP (!) 163/69   Pulse 73   Temp 98.4 F (36.9 C) (Temporal)   Ht 5' 8"  (1.727 m)   Wt 297 lb (134.7 kg)   SpO2 98%   BMI 45.16 kg/m   General:   Alert,  Well-developed, well-nourished, pleasant and cooperative in NAD Airway:  Mallampati 3 Lungs:  Clear throughout to auscultation.   Heart:  Regular rate and rhythm; no murmurs, clicks, rubs,  or gallops. Abdomen:  Soft, nontender and nondistended. Normal bowel sounds.   Neuro/Psych:  Normal mood and affect. A and O x 3   Fawna Cranmer E. Candis Schatz, MD [facility]

## 2021-11-18 NOTE — Progress Notes (Signed)
Called to room to assist during endoscopic procedure.  Patient ID and intended procedure confirmed with present staff. Received instructions for my participation in the procedure from the performing physician.  

## 2021-11-18 NOTE — Progress Notes (Signed)
VS completed by CW.   Pt's states no medical or surgical changes since previsit or office visit.  

## 2021-11-18 NOTE — Progress Notes (Signed)
To pacu, VSS. Report to [email or web address] 

## 2021-11-22 ENCOUNTER — Telehealth: Payer: Self-pay

## 2021-11-22 NOTE — Telephone Encounter (Signed)
Left message on follow up call. 

## 2021-11-26 NOTE — Progress Notes (Signed)
Christopher Roberson, Good news: the polyp that I removed during your recent examination was NOT precancerous.  You should continue to follow current colorectal cancer screening guidelines with a repeat colonoscopy in 10 years.    If you develop any new rectal bleeding, abdominal pain or significant bowel habit changes, please contact me before then.

## 2022-05-05 ENCOUNTER — Inpatient Hospital Stay (HOSPITAL_BASED_OUTPATIENT_CLINIC_OR_DEPARTMENT_OTHER)
Admission: EM | Admit: 2022-05-05 | Discharge: 2022-05-07 | DRG: 062 | Disposition: A | Discharge status: Home / Self Care | Payer: Medicare HMO | Attending: Neurology | Admitting: Neurology

## 2022-05-05 ENCOUNTER — Encounter (HOSPITAL_BASED_OUTPATIENT_CLINIC_OR_DEPARTMENT_OTHER): Payer: Self-pay | Admitting: Emergency Medicine

## 2022-05-05 ENCOUNTER — Emergency Department (HOSPITAL_BASED_OUTPATIENT_CLINIC_OR_DEPARTMENT_OTHER): Payer: Medicare HMO

## 2022-05-05 ENCOUNTER — Other Ambulatory Visit: Payer: Self-pay

## 2022-05-05 DIAGNOSIS — G4730 Sleep apnea, unspecified: Secondary | ICD-10-CM | POA: Diagnosis present

## 2022-05-05 DIAGNOSIS — Z7902 Long term (current) use of antithrombotics/antiplatelets: Secondary | ICD-10-CM

## 2022-05-05 DIAGNOSIS — I10 Essential (primary) hypertension: Secondary | ICD-10-CM | POA: Diagnosis present

## 2022-05-05 DIAGNOSIS — E1165 Type 2 diabetes mellitus with hyperglycemia: Secondary | ICD-10-CM | POA: Diagnosis present

## 2022-05-05 DIAGNOSIS — Y92009 Unspecified place in unspecified non-institutional (private) residence as the place of occurrence of the external cause: Secondary | ICD-10-CM

## 2022-05-05 DIAGNOSIS — R2689 Other abnormalities of gait and mobility: Secondary | ICD-10-CM | POA: Diagnosis present

## 2022-05-05 DIAGNOSIS — I6389 Other cerebral infarction: Principal | ICD-10-CM | POA: Diagnosis present

## 2022-05-05 DIAGNOSIS — G4733 Obstructive sleep apnea (adult) (pediatric): Secondary | ICD-10-CM | POA: Diagnosis present

## 2022-05-05 DIAGNOSIS — E669 Obesity, unspecified: Secondary | ICD-10-CM | POA: Diagnosis present

## 2022-05-05 DIAGNOSIS — R201 Hypoesthesia of skin: Secondary | ICD-10-CM | POA: Diagnosis present

## 2022-05-05 DIAGNOSIS — E785 Hyperlipidemia, unspecified: Secondary | ICD-10-CM | POA: Diagnosis present

## 2022-05-05 DIAGNOSIS — Z8249 Family history of ischemic heart disease and other diseases of the circulatory system: Secondary | ICD-10-CM

## 2022-05-05 DIAGNOSIS — Z7984 Long term (current) use of oral hypoglycemic drugs: Secondary | ICD-10-CM

## 2022-05-05 DIAGNOSIS — R29704 NIHSS score 4: Secondary | ICD-10-CM | POA: Diagnosis present

## 2022-05-05 DIAGNOSIS — I639 Cerebral infarction, unspecified: Secondary | ICD-10-CM | POA: Diagnosis not present

## 2022-05-05 DIAGNOSIS — Z800 Family history of malignant neoplasm of digestive organs: Secondary | ICD-10-CM

## 2022-05-05 DIAGNOSIS — I69351 Hemiplegia and hemiparesis following cerebral infarction affecting right dominant side: Secondary | ICD-10-CM

## 2022-05-05 DIAGNOSIS — W19XXXA Unspecified fall, initial encounter: Secondary | ICD-10-CM | POA: Diagnosis present

## 2022-05-05 DIAGNOSIS — F32A Depression, unspecified: Secondary | ICD-10-CM | POA: Diagnosis present

## 2022-05-05 DIAGNOSIS — Z8042 Family history of malignant neoplasm of prostate: Secondary | ICD-10-CM

## 2022-05-05 DIAGNOSIS — Z23 Encounter for immunization: Secondary | ICD-10-CM

## 2022-05-05 DIAGNOSIS — Z6841 Body Mass Index (BMI) 40.0 and over, adult: Secondary | ICD-10-CM

## 2022-05-05 LAB — DIFFERENTIAL
Abs Immature Granulocytes: 0.02 10*3/uL (ref 0.00–0.07)
Basophils Absolute: 0 10*3/uL (ref 0.0–0.1)
Basophils Relative: 0 %
Eosinophils Absolute: 0.2 10*3/uL (ref 0.0–0.5)
Eosinophils Relative: 3 %
Immature Granulocytes: 0 %
Lymphocytes Relative: 30 %
Lymphs Abs: 2.2 10*3/uL (ref 0.7–4.0)
Monocytes Absolute: 0.6 10*3/uL (ref 0.1–1.0)
Monocytes Relative: 9 %
Neutro Abs: 4.3 10*3/uL (ref 1.7–7.7)
Neutrophils Relative %: 58 %

## 2022-05-05 LAB — CBG MONITORING, ED: Glucose-Capillary: 114 mg/dL — ABNORMAL HIGH (ref 70–99)

## 2022-05-05 LAB — CBC
HCT: 37.5 % — ABNORMAL LOW (ref 39.0–52.0)
Hemoglobin: 12.5 g/dL — ABNORMAL LOW (ref 13.0–17.0)
MCH: 29.3 pg (ref 26.0–34.0)
MCHC: 33.3 g/dL (ref 30.0–36.0)
MCV: 88 fL (ref 80.0–100.0)
Platelets: 284 10*3/uL (ref 150–400)
RBC: 4.26 MIL/uL (ref 4.22–5.81)
RDW: 12.9 % (ref 11.5–15.5)
WBC: 7.4 10*3/uL (ref 4.0–10.5)
nRBC: 0 % (ref 0.0–0.2)

## 2022-05-05 LAB — PROTIME-INR
INR: 1 (ref 0.8–1.2)
Prothrombin Time: 13.2 seconds (ref 11.4–15.2)

## 2022-05-05 LAB — APTT: aPTT: 28 seconds (ref 24–36)

## 2022-05-05 MED ORDER — HYDRALAZINE HCL 20 MG/ML IJ SOLN
10.0000 mg | Freq: Once | INTRAMUSCULAR | Status: AC
  Administered 2022-05-05: 10 mg via INTRAVENOUS

## 2022-05-05 MED ORDER — HYDRALAZINE HCL 20 MG/ML IJ SOLN
INTRAMUSCULAR | Status: AC
  Filled 2022-05-05: qty 1

## 2022-05-05 MED ORDER — CLEVIDIPINE BUTYRATE 0.5 MG/ML IV EMUL
0.0000 mg/h | INTRAVENOUS | Status: DC
  Administered 2022-05-05: 2 mg/h via INTRAVENOUS
  Administered 2022-05-06: 6 mg/h via INTRAVENOUS
  Filled 2022-05-05 (×2): qty 100

## 2022-05-05 MED ORDER — LABETALOL HCL 5 MG/ML IV SOLN
INTRAVENOUS | Status: AC
  Filled 2022-05-05: qty 8

## 2022-05-05 MED ORDER — TENECTEPLASE FOR STROKE
PACK | INTRAVENOUS | Status: AC
  Filled 2022-05-05: qty 10

## 2022-05-05 MED ORDER — TENECTEPLASE FOR STROKE
25.0000 mg | PACK | Freq: Once | INTRAVENOUS | Status: AC
  Administered 2022-05-05: 25 mg via INTRAVENOUS

## 2022-05-05 NOTE — ED Notes (Signed)
Code Stroke Activated via Tele-Cart at 1056pm per EDP.

## 2022-05-05 NOTE — Progress Notes (Addendum)
LKW 2000, r sided deficits with sensation and weakness  2228 arrival 2255 elert 2300 EDP assigned 2305 prepull requested of tnk and labetalol  Difficulty getting access, patient will go for NCCT and come back 2306 down to ct 2311 back in room (no scans profiled yet) 2312 paged [facility] 2314 [facility] on screen 2316 IV obtained 2326 calling spouse re tnk (1) 2327 labetalol 2332 calling spouse re tnk (2) 2332 tnk order in 2333 labetalol 2341 hydralazine  176/92 2345 cleviprex started 2346 flush 2347 tnk ('25mg'$ /14m) 2347 second flush 2356 back down to ct 0037 read relayed to [facility] for advanced imaging.

## 2022-05-05 NOTE — ED Provider Notes (Signed)
[facility] EMERGENCY DEPT Provider Note   CSN: [id number] Arrival date & time: 05/05/22  2228     History  Chief Complaint  Patient presents with   Code Stroke    Christopher Roberson is a 47 y.o. male with strokes w/ residual mild R hemiparesis, DM, HTN, obesity, OSA on CPAP, depression who presents with dizziness, RUE weakness/numbness.  Patient states that at 2000 he was in his normal state of health when he acutely began falling in his home due to acute onset dizziness.  Has associated R sided weakness/numbness/tingling of UE/LE. He states it is acutely different from his baseline. Also presents with an 8/10 all-over headache throbbing. States that the symptoms are similar to his prior strokes.  He does not take a blood thinner, just Plavix.  He has no associated head trauma or LOC, neck pain, seizure-like activity, N/V/D/C.  Patient lives alone.  LKN 2000 AC? Just plavix BGL 114 mg/dL BP 199/108 mmHg  HPI     Home Medications Prior to Admission medications   Medication Sig Start Date End Date Taking? Authorizing Provider  albuterol (VENTOLIN HFA) 108 (90 Base) MCG/ACT inhaler Inhale 2 puffs into the lungs every 4 (four) hours as needed for wheezing or shortness of breath. 03/12/16   [provider]  amLODipine (NORVASC) 5 MG tablet Take 5 mg by mouth daily. 03/31/21   [provider]  atorvastatin (LIPITOR) 80 MG tablet Take 1 tablet (80 mg total) by mouth every morning. 04/30/21   [name], MD  blood glucose meter kit and supplies KIT Dispense based on patient and insurance preference. Use up to four times daily as directed. 04/30/21   [name], MD  carvedilol (COREG) 25 MG tablet Take 25 mg by mouth 2 (two) times daily. 05/10/21   [provider]  chlorthalidone (HYGROTON) 25 MG tablet Take 25 mg by mouth daily. 11/25/20   [provider]  clopidogrel (PLAVIX) 75 MG tablet Take 1 tablet (75 mg total) by mouth daily. 05/01/21    [name], MD  dapagliflozin propanediol (FARXIGA) 5 MG TABS tablet Take 5 mg by mouth daily. 08/13/20   [provider]  insulin glargine (LANTUS SOLOSTAR) 100 UNIT/ML Solostar Pen Take 30units every morning and 45units every night Patient not taking: Reported on 11/18/2021 04/30/21   [name], MD  Insulin Pen Needle 32G X 4 MM MISC Use as directed 04/30/21   [name], MD  lisinopril (ZESTRIL) 40 MG tablet Take 1 tablet (40 mg total) by mouth every morning. Resume in  1 week Patient not taking: Reported on 08/20/2021 10/10/18   [name], [name], MD  losartan (COZAAR) 50 MG tablet Take 50 mg by mouth daily. 05/31/21   [provider]  metFORMIN (GLUCOPHAGE) 1000 MG tablet Take 1 tablet (1,000 mg total) by mouth 2 (two) times daily with a meal. 04/30/21   [name], MD  methocarbamol (ROBAXIN) 500 MG tablet Take 1 tablet (500 mg total) by mouth at bedtime as needed for muscle spasms. 06/25/21   Lamptey, Myrene Galas, MD  metoprolol succinate (TOPROL-XL) 50 MG 24 hr tablet Take 1 tablet (50 mg total) by mouth daily. 04/30/21   [name], MD  olmesartan (BENICAR) 20 MG tablet Take 20 mg by mouth daily. 11/25/20   [provider]  pregabalin (LYRICA) 150 MG capsule Take 150 mg by mouth 2 (two) times daily as needed (pain). 09/18/18   [provider]  Semaglutide,0.25 or 0.'5MG'$ /DOS, (OZEMPIC, 0.25 OR 0.5 MG/DOSE,)  2 MG/1.5ML SOPN Inject 0.25 mg into the skin every Monday. 08/20/18   [provider]  sertraline (ZOLOFT) 50 MG tablet Take 50 mg by mouth daily. 08/01/20   [provider]  spironolactone (ALDACTONE) 25 MG tablet Take 25 mg by mouth daily. 06/24/20   [provider]  SURE COMFORT PEN NEEDLES 31G X 8 MM MISC See admin instructions. 05/05/21   [provider]  terazosin (HYTRIN) 5 MG capsule Take 5 mg by mouth at bedtime. 05/30/18   [provider]  traZODone (DESYREL) 100 MG tablet Take 100-300 mg by mouth  at bedtime as needed for sleep. 07/26/18   [provider]      Allergies    Percocet [oxycodone-acetaminophen]    Review of Systems   Review of Systems  Unable to perform ROS: Acuity of condition     Physical Exam Updated Vital Signs BP (!) 199/108 (BP Location: Left Arm)   Pulse 78   Temp 98.5 F (36.9 C) (Oral)   Resp 18   Ht '5\' 8"'$  (1.727 m)   Wt 127 kg   SpO2 100%   BMI 42.57 kg/m  Physical Exam General: Normal appearing male, lying in bed.  HEENT: PERRLA, EOMI, Sclera anicteric, MMM, trachea midline.  Cardiology: RRR, no murmurs/rubs/gallops. BL radial and DP pulses equal bilaterally.  Resp: Normal respiratory rate and effort. CTAB, no wheezes, rhonchi, crackles.  Abd: Soft, non-tender, non-distended. No rebound tenderness or guarding.  GU: Deferred. MSK: No peripheral edema or signs of trauma. Extremities without deformity or TTP. No cyanosis or clubbing. Skin: warm, dry. No rashes or lesions. Neuro: A&Ox4, CNs II-XII grossly intact. 4/5 strength in RUE, RLE with drift. Tongue protrudes midline. Slow unsteady gait. No nystagmus.  Sensation decreased to light touch in RUE/RLE.  Psych: Normal mood and affect.   1a  Level of consciousness: 0=alert; keenly responsive  1b. LOC questions:  0=Performs both tasks correctly  1c. LOC commands: 0=Performs both tasks correctly  2.  Best Gaze: 0=normal  3.  Visual: 0=No visual loss  4. Facial Palsy: 0=Normal symmetric movement  5a.  Motor left arm: 0=No drift, limb holds 90 (or 45) degrees for full 10 seconds  5b.  Motor right arm: 1=Drift, limb holds 90 (or 45) degrees but drifts down before full 10 seconds: does not hit bed  6a. motor left leg: 0=No drift, limb holds 90 (or 45) degrees for full 10 seconds  6b  Motor right leg:  1=Drift, limb holds 90 (or 45) degrees but drifts down before full 10 seconds: does not hit bed  7. Limb Ataxia: 0=Absent  8.  Sensory: 1=Mild to moderate sensory loss; patient feels pinprick  is less sharp or is dull on the affected side; there is a loss of superficial pain with pinprick but patient is aware He is being touched  9. Best Language:  0=No aphasia, normal  10. Dysarthria: 0=Normal  11. Extinction and Inattention: 0=No abnormality   Total:   3        ED Results / Procedures / Treatments   Labs (all labs ordered are listed, but only abnormal results are displayed) Labs Reviewed  CBG MONITORING, ED - Abnormal; Notable for the following components:      Result Value   Glucose-Capillary 114 (*)    All other components within normal limits    EKG EKG Interpretation  Date/Time:  Thursday May 05 2022 23:14:47 EST Ventricular Rate:  67 PR Interval:  205 QRS Duration:  111 QT Interval:  413 QTC Calculation: 436 R Axis:   43 Text Interpretation: Sinus rhythm Borderline prolonged PR interval Low voltage, precordial leads Borderline T wave abnormalities No significant change since last tracing Confirmed by Isla Pence [phone]) on 05/06/2022 7:57:19 AM  Radiology CTH: 1. Negative head CT.  No acute intracranial abnormality. 2. ASPECTS is 10.  CTA H&N: 1. Negative CTA for large vessel occlusion or other emergent finding. 2. Negative CT perfusion for acute ischemia. 14 mL of apparent delayed perfusion, favored to be artifactual. No definite perfusion abnormality. 3. Mild atheromatous change about the carotid bifurcations without hemodynamically significant stenosis.  Procedures Procedures    Medications Ordered in ED Medications  tenecteplase (TNKASE) 50 MG injection for Stroke (has no administration in time range)  hydrALAZINE (APRESOLINE) 20 MG/ML injection (has no administration in time range)  labetalol (NORMODYNE) 5 MG/ML injection (  Given 05/05/22 2325)  tenecteplase (TNKASE) injection for Stroke 25 mg (25 mg Intravenous Given 05/05/22 July 11, 2345)  hydrALAZINE (APRESOLINE) injection 10 mg (10 mg Intravenous Given 05/05/22 07-11-32)    ED Course/  Medical Decision Making/ A&P                          Medical Decision Making Amount and/or Complexity of Data Reviewed Labs: ordered. Decision-making details documented in ED Course. Radiology: ordered.  Risk Prescription drug management. Decision regarding hospitalization.    This patient presents to the ED for concern of falls, R-sided weakness - stroke-like symptoms; this involves an extensive number of treatment options, and is a complaint that carries with it a high risk of complications and morbidity.  I considered the following differential and admission for this acute, potentially life threatening condition.   MDM:    I evaluated this patient at the request of nursing staff in triage.  Patient is observed to have given the acute onset of neurological symptoms, stroke is the most concerning etiology of these acute symptoms. The neuro exam is significant for mild weakness/sensory deficit on RUE/RLE.  Also consider other causes the patient symptoms such as hypoglycemia/hyperglycemia, electrolyte abnormalities, Todd's paralysis though no seizure activity was reported but patient reported falling at home, vertigo.  Neurology team evaluating patient at bedside. Plan to obtain emergent CT brain.  Plan: - stat head CT, consider additional imaging including CTA and perfusion scan pending initial CT  - neurology has been consulted  - consider TNK if neg head CT - Manage hypertension as needed -- sBP goal 140-160 if ICH  -- if TNK given, will keep sBP <180 and dBP<105 - labs & other orders as below   Clinical Course as of 05/05/22 2305  Thu May 05, 2022  2303 BP(!): 199/108 [HN]  Jul 11, 2301 Glucose-Capillary(!): 114 [HN]  07-12-02 Patient stroke coded immediately from triage. Telestroke evaluating now. [HN]    Clinical Course User Index [HN] Audley Hose, MD    Labs: I Ordered, and personally interpreted labs.  The pertinent results include: Potassium 3.2 otherwise unremarkable CMP.   WBC 7.4, hemoglobin 12.5, INR 1.0, PTT 28, urine and UDS unremarkable, negative EtOH  Imaging Studies ordered: I ordered imaging studies including CTH, CTA H&N I independently visualized and interpreted imaging. I agree with the radiologist interpretation  Additional history obtained from chart review.    Cardiac Monitoring: The patient was maintained on a cardiac monitor.  I personally viewed and interpreted the cardiac monitored which showed an underlying rhythm of: NSR  Reevaluation: After the interventions  noted above, I reevaluated the patient and found that they have :improved  Social Determinants of Health: Patient lives independently   Disposition: Teleneurology evaluation at bedside with negative head CT and aspects of 10 NIH stroke scale of 3, neurology consented patient for IV tenecteplase and it was administered.  Patient did require administration of IV labetalol for hypertension.  Patient will be admitted to neurology stroke service for continued w/u including MRI and monitoring.   Co morbidities that complicate the patient evaluation  Past Medical History:  Diagnosis Date   Depression    Diabetes mellitus    Hypertension    Obesity    Sleep apnea    On CPAP machine   Stroke ([place])      Medicines No orders of the defined types were placed in this encounter.   I have reviewed the patients home medicines and have made adjustments as needed  Problem List / ED Course: Problem List Items Addressed This Visit       Cardiovascular and Mediastinum   * (Principal) Acute CVA (cerebrovascular accident) ([place]) - Primary   Stroke [facility])   Relevant Orders   Ambulatory referral to Neurology                This note was created using dictation software, which may contain spelling or grammatical errors.    Audley Hose, MD 05/17/22 [phone]

## 2022-05-05 NOTE — ED Triage Notes (Signed)
  Patient comes in with dizziness and R sided weakness that started two hours ago.  Patient states he fell twice at home within that timeframe and that he normally has no gait issues. Patient had stroke last year.  NIH 4 in triage.  R sided sensory deficit with R sided weakness and drift.  Pain 8/10, throbbing headache.  Dr Mayra Neer in triage.

## 2022-05-06 ENCOUNTER — Other Ambulatory Visit (HOSPITAL_BASED_OUTPATIENT_CLINIC_OR_DEPARTMENT_OTHER): Payer: Medicare HMO

## 2022-05-06 ENCOUNTER — Inpatient Hospital Stay (HOSPITAL_COMMUNITY): Payer: Medicare HMO

## 2022-05-06 ENCOUNTER — Emergency Department (HOSPITAL_BASED_OUTPATIENT_CLINIC_OR_DEPARTMENT_OTHER): Payer: Medicare HMO

## 2022-05-06 DIAGNOSIS — I639 Cerebral infarction, unspecified: Secondary | ICD-10-CM | POA: Diagnosis present

## 2022-05-06 DIAGNOSIS — W19XXXA Unspecified fall, initial encounter: Secondary | ICD-10-CM | POA: Diagnosis present

## 2022-05-06 DIAGNOSIS — I6389 Other cerebral infarction: Secondary | ICD-10-CM

## 2022-05-06 DIAGNOSIS — F32A Depression, unspecified: Secondary | ICD-10-CM | POA: Diagnosis present

## 2022-05-06 DIAGNOSIS — Z8673 Personal history of transient ischemic attack (TIA), and cerebral infarction without residual deficits: Secondary | ICD-10-CM | POA: Diagnosis not present

## 2022-05-06 DIAGNOSIS — I10 Essential (primary) hypertension: Secondary | ICD-10-CM | POA: Diagnosis present

## 2022-05-06 DIAGNOSIS — Z8249 Family history of ischemic heart disease and other diseases of the circulatory system: Secondary | ICD-10-CM | POA: Diagnosis not present

## 2022-05-06 DIAGNOSIS — Z7902 Long term (current) use of antithrombotics/antiplatelets: Secondary | ICD-10-CM | POA: Diagnosis not present

## 2022-05-06 DIAGNOSIS — I69351 Hemiplegia and hemiparesis following cerebral infarction affecting right dominant side: Secondary | ICD-10-CM | POA: Diagnosis not present

## 2022-05-06 DIAGNOSIS — E669 Obesity, unspecified: Secondary | ICD-10-CM | POA: Diagnosis present

## 2022-05-06 DIAGNOSIS — Z8042 Family history of malignant neoplasm of prostate: Secondary | ICD-10-CM | POA: Diagnosis not present

## 2022-05-06 DIAGNOSIS — Z6841 Body Mass Index (BMI) 40.0 and over, adult: Secondary | ICD-10-CM | POA: Diagnosis not present

## 2022-05-06 DIAGNOSIS — Z800 Family history of malignant neoplasm of digestive organs: Secondary | ICD-10-CM | POA: Diagnosis not present

## 2022-05-06 DIAGNOSIS — E11649 Type 2 diabetes mellitus with hypoglycemia without coma: Secondary | ICD-10-CM | POA: Diagnosis not present

## 2022-05-06 DIAGNOSIS — R201 Hypoesthesia of skin: Secondary | ICD-10-CM | POA: Diagnosis present

## 2022-05-06 DIAGNOSIS — E1165 Type 2 diabetes mellitus with hyperglycemia: Secondary | ICD-10-CM | POA: Diagnosis present

## 2022-05-06 DIAGNOSIS — R29704 NIHSS score 4: Secondary | ICD-10-CM | POA: Diagnosis present

## 2022-05-06 DIAGNOSIS — G4733 Obstructive sleep apnea (adult) (pediatric): Secondary | ICD-10-CM | POA: Diagnosis present

## 2022-05-06 DIAGNOSIS — Z23 Encounter for immunization: Secondary | ICD-10-CM | POA: Diagnosis present

## 2022-05-06 DIAGNOSIS — G4730 Sleep apnea, unspecified: Secondary | ICD-10-CM | POA: Diagnosis present

## 2022-05-06 DIAGNOSIS — Z7984 Long term (current) use of oral hypoglycemic drugs: Secondary | ICD-10-CM | POA: Diagnosis not present

## 2022-05-06 DIAGNOSIS — E785 Hyperlipidemia, unspecified: Secondary | ICD-10-CM | POA: Diagnosis present

## 2022-05-06 DIAGNOSIS — R2689 Other abnormalities of gait and mobility: Secondary | ICD-10-CM | POA: Diagnosis present

## 2022-05-06 DIAGNOSIS — Y92009 Unspecified place in unspecified non-institutional (private) residence as the place of occurrence of the external cause: Secondary | ICD-10-CM | POA: Diagnosis not present

## 2022-05-06 LAB — COMPREHENSIVE METABOLIC PANEL
ALT: 15 U/L (ref 0–44)
AST: 16 U/L (ref 15–41)
Albumin: 4.3 g/dL (ref 3.5–5.0)
Alkaline Phosphatase: 42 U/L (ref 38–126)
Anion gap: 9 (ref 5–15)
BUN: 10 mg/dL (ref 6–20)
CO2: 27 mmol/L (ref 22–32)
Calcium: 9.5 mg/dL (ref 8.9–10.3)
Chloride: 104 mmol/L (ref 98–111)
Creatinine, Ser: 0.84 mg/dL (ref 0.61–1.24)
GFR, Estimated: >60 mL/min (ref >60)
Glucose, Bld: 123 mg/dL — ABNORMAL HIGH (ref 70–99)
Potassium: 3.2 mmol/L — ABNORMAL LOW (ref 3.5–5.1)
Sodium: 140 mmol/L (ref 135–145)
Total Bilirubin: 0.9 mg/dL (ref 0.3–1.2)
Total Protein: 7.6 g/dL (ref 6.5–8.1)

## 2022-05-06 LAB — LIPID PANEL
Cholesterol: 172 mg/dL (ref 0–200)
HDL: 65 mg/dL (ref >40)
LDL Cholesterol: 94 mg/dL (ref 0–99)
Total CHOL/HDL Ratio: 2.6 RATIO
Triglycerides: 67 mg/dL (ref <150)
VLDL: 13 mg/dL (ref 0–40)

## 2022-05-06 LAB — RAPID URINE DRUG SCREEN, HOSP PERFORMED
Amphetamines: NOT DETECTED
Barbiturates: NOT DETECTED
Benzodiazepines: NOT DETECTED
Cocaine: NOT DETECTED
Opiates: NOT DETECTED
Tetrahydrocannabinol: NOT DETECTED

## 2022-05-06 LAB — URINALYSIS, ROUTINE W REFLEX MICROSCOPIC
Bilirubin Urine: NEGATIVE
Glucose, UA: NEGATIVE mg/dL
Hgb urine dipstick: NEGATIVE
Ketones, ur: NEGATIVE mg/dL
Leukocytes,Ua: NEGATIVE
Nitrite: NEGATIVE
Protein, ur: NEGATIVE mg/dL
Specific Gravity, Urine: 1.01 (ref 1.005–1.030)
pH: 7 (ref 5.0–8.0)

## 2022-05-06 LAB — ECHOCARDIOGRAM COMPLETE
Area-P 1/2: 3.6 cm2
Height: 68 in
S' Lateral: 3.2 cm
Weight: 4480 oz

## 2022-05-06 LAB — GLUCOSE, CAPILLARY
Glucose-Capillary: 126 mg/dL — ABNORMAL HIGH (ref 70–99)
Glucose-Capillary: 133 mg/dL — ABNORMAL HIGH (ref 70–99)
Glucose-Capillary: 133 mg/dL — ABNORMAL HIGH (ref 70–99)
Glucose-Capillary: 214 mg/dL — ABNORMAL HIGH (ref 70–99)
Glucose-Capillary: 189 mg/dL — ABNORMAL HIGH (ref 70–99)
Glucose-Capillary: 237 mg/dL — ABNORMAL HIGH (ref 70–99)
Glucose-Capillary: 210 mg/dL — ABNORMAL HIGH (ref 70–99)

## 2022-05-06 LAB — HIV ANTIBODY (ROUTINE TESTING W REFLEX): HIV Screen 4th Generation wRfx: NONREACTIVE

## 2022-05-06 LAB — MRSA NEXT GEN BY PCR, NASAL: MRSA by PCR Next Gen: NOT DETECTED

## 2022-05-06 LAB — HEMOGLOBIN A1C
Hgb A1c MFr Bld: 7 % — ABNORMAL HIGH (ref 4.8–5.6)
Mean Plasma Glucose: 154.2 mg/dL

## 2022-05-06 LAB — ETHANOL: Alcohol, Ethyl (B): <10 mg/dL (ref <10)

## 2022-05-06 MED ORDER — CHLORHEXIDINE GLUCONATE CLOTH 2 % EX PADS
6.0000 | MEDICATED_PAD | Freq: Every day | CUTANEOUS | Status: DC
  Administered 2022-05-06 – 2022-05-07 (×2): 6 via TOPICAL

## 2022-05-06 MED ORDER — SPIRONOLACTONE 25 MG PO TABS
25.0000 mg | ORAL_TABLET | Freq: Every day | ORAL | Status: DC
  Administered 2022-05-06 – 2022-05-07 (×2): 25 mg via ORAL
  Filled 2022-05-06 (×2): qty 1

## 2022-05-06 MED ORDER — LABETALOL HCL 5 MG/ML IV SOLN: 20.0000 mg | INTRAVENOUS | Status: DC | PRN

## 2022-05-06 MED ORDER — AMLODIPINE BESYLATE 5 MG PO TABS
5.0000 mg | ORAL_TABLET | Freq: Every day | ORAL | Status: DC
  Administered 2022-05-06 – 2022-05-07 (×2): 5 mg via ORAL
  Filled 2022-05-06 (×2): qty 1

## 2022-05-06 MED ORDER — ORAL CARE MOUTH RINSE: 15.0000 mL | OROMUCOSAL | Status: DC | PRN

## 2022-05-06 MED ORDER — CHLORTHALIDONE 25 MG PO TABS
25.0000 mg | ORAL_TABLET | Freq: Every day | ORAL | Status: DC
  Administered 2022-05-06 – 2022-05-07 (×2): 25 mg via ORAL
  Filled 2022-05-06 (×2): qty 1

## 2022-05-06 MED ORDER — INSULIN ASPART 100 UNIT/ML IJ SOLN
0.0000 [IU] | INTRAMUSCULAR | Status: DC
  Administered 2022-05-06: 3 [IU] via SUBCUTANEOUS
  Administered 2022-05-06 (×2): 7 [IU] via SUBCUTANEOUS
  Administered 2022-05-06: 4 [IU] via SUBCUTANEOUS
  Administered 2022-05-06: 3 [IU] via SUBCUTANEOUS
  Administered 2022-05-06: 4 [IU] via SUBCUTANEOUS
  Administered 2022-05-07: 3 [IU] via SUBCUTANEOUS
  Administered 2022-05-07: 4 [IU] via SUBCUTANEOUS
  Administered 2022-05-07: 7 [IU] via SUBCUTANEOUS

## 2022-05-06 MED ORDER — ATORVASTATIN CALCIUM 40 MG PO TABS
40.0000 mg | ORAL_TABLET | Freq: Every morning | ORAL | Status: DC
  Administered 2022-05-07: 40 mg via ORAL
  Filled 2022-05-06: qty 1

## 2022-05-06 MED ORDER — PANTOPRAZOLE SODIUM 40 MG IV SOLR
40.0000 mg | Freq: Every day | INTRAVENOUS | Status: DC
  Administered 2022-05-06: 40 mg via INTRAVENOUS
  Filled 2022-05-06: qty 10

## 2022-05-06 MED ORDER — DIAZEPAM 5 MG PO TABS
5.0000 mg | ORAL_TABLET | Freq: Once | ORAL | Status: AC
  Administered 2022-05-06: 5 mg via ORAL
  Filled 2022-05-06: qty 1

## 2022-05-06 MED ORDER — ALBUTEROL SULFATE (2.5 MG/3ML) 0.083% IN NEBU: 3.0000 mL | INHALATION_SOLUTION | RESPIRATORY_TRACT | Status: DC | PRN

## 2022-05-06 MED ORDER — TERAZOSIN HCL 5 MG PO CAPS
5.0000 mg | ORAL_CAPSULE | Freq: Every day | ORAL | Status: DC
  Administered 2022-05-06: 5 mg via ORAL
  Filled 2022-05-06 (×2): qty 1

## 2022-05-06 MED ORDER — METOPROLOL SUCCINATE ER 50 MG PO TB24: 50.0000 mg | ORAL_TABLET | Freq: Every day | ORAL | Status: DC

## 2022-05-06 MED ORDER — CARVEDILOL 25 MG PO TABS
25.0000 mg | ORAL_TABLET | Freq: Two times a day (BID) | ORAL | Status: DC
  Administered 2022-05-06 – 2022-05-07 (×3): 25 mg via ORAL
  Filled 2022-05-06 (×3): qty 1

## 2022-05-06 MED ORDER — STROKE: EARLY STAGES OF RECOVERY BOOK
Freq: Once | Status: AC
  Filled 2022-05-06: qty 1

## 2022-05-06 MED ORDER — IOHEXOL 350 MG/ML SOLN
100.0000 mL | Freq: Once | INTRAVENOUS | Status: AC | PRN
  Administered 2022-05-06: 100 mL via INTRAVENOUS

## 2022-05-06 MED ORDER — SODIUM CHLORIDE 0.9 % IV SOLN: INTRAVENOUS | Status: DC

## 2022-05-06 MED ORDER — POTASSIUM CHLORIDE 10 MEQ/100ML IV SOLN
10.0000 meq | INTRAVENOUS | Status: AC
  Administered 2022-05-06 (×3): 10 meq via INTRAVENOUS
  Filled 2022-05-06 (×3): qty 100

## 2022-05-06 MED ORDER — ATORVASTATIN CALCIUM 80 MG PO TABS
80.0000 mg | ORAL_TABLET | Freq: Every morning | ORAL | Status: DC
  Administered 2022-05-06: 80 mg via ORAL
  Filled 2022-05-06: qty 1

## 2022-05-06 MED ORDER — SERTRALINE HCL 50 MG PO TABS
50.0000 mg | ORAL_TABLET | Freq: Every day | ORAL | Status: DC
  Administered 2022-05-06 – 2022-05-07 (×2): 50 mg via ORAL
  Filled 2022-05-06 (×2): qty 1

## 2022-05-06 MED ORDER — ONDANSETRON HCL 4 MG/2ML IJ SOLN
INTRAMUSCULAR | Status: AC
  Filled 2022-05-06: qty 2

## 2022-05-06 MED ORDER — TRAZODONE HCL 50 MG PO TABS: 100.0000 mg | ORAL_TABLET | Freq: Every evening | ORAL | Status: DC | PRN

## 2022-05-06 MED ORDER — SENNOSIDES-DOCUSATE SODIUM 8.6-50 MG PO TABS: 1.0000 | ORAL_TABLET | Freq: Every evening | ORAL | Status: DC | PRN

## 2022-05-06 MED ORDER — LOSARTAN POTASSIUM 50 MG PO TABS
50.0000 mg | ORAL_TABLET | Freq: Every day | ORAL | Status: DC
  Administered 2022-05-06: 50 mg via ORAL
  Filled 2022-05-06: qty 1

## 2022-05-06 MED ORDER — ACETAMINOPHEN 325 MG PO TABS
650.0000 mg | ORAL_TABLET | Freq: Four times a day (QID) | ORAL | Status: DC | PRN
  Administered 2022-05-06 (×2): 650 mg via ORAL
  Filled 2022-05-06 (×3): qty 2

## 2022-05-06 MED ORDER — LOSARTAN POTASSIUM 50 MG PO TABS
100.0000 mg | ORAL_TABLET | Freq: Every day | ORAL | Status: DC
  Administered 2022-05-06 – 2022-05-07 (×2): 100 mg via ORAL
  Filled 2022-05-06: qty 2

## 2022-05-06 NOTE — Progress Notes (Addendum)
STROKE TEAM PROGRESS NOTE   INTERVAL HISTORY No family at the bedside. Patient states he is back to baseline. He has had mild right sided weakness since his first stroke. Received TNK at [facility].  Vitals:   05/06/22 0630 05/06/22 0645 05/06/22 0647 05/06/22 0810  BP: (!) 144/81 136/79 136/79   Pulse: 74 74 69   Resp: '19 20 17   '$ Temp:    97.9 F (36.6 C)  TempSrc:    Oral  SpO2: 94% 92% 92%   Weight:      Height:       CBC:  Recent Labs  Lab 05/05/22 2320  WBC 7.4  NEUTROABS 4.3  HGB 12.5*  HCT 37.5*  MCV 88.0  PLT 633   Basic Metabolic Panel:  Recent Labs  Lab 05/05/22 2320  NA 140  K 3.2*  CL 104  CO2 27  GLUCOSE 123*  BUN 10  CREATININE 0.84  CALCIUM 9.5   Lipid Panel:  Recent Labs  Lab 05/06/22 0333  CHOL 172  TRIG 67  HDL 65  CHOLHDL 2.6  VLDL 13  LDLCALC 94   HgbA1c:  Recent Labs  Lab 05/06/22 0333  HGBA1C 7.0*   Urine Drug Screen:  Recent Labs  Lab 05/05/22 2304  LABOPIA NONE DETECTED  COCAINSCRNUR NONE DETECTED  LABBENZ NONE DETECTED  AMPHETMU NONE DETECTED  THCU NONE DETECTED  LABBARB NONE DETECTED    Alcohol Level  Recent Labs  Lab 05/05/22 2320  ETH <10    IMAGING past 24 hours CT ANGIO HEAD NECK W WO CM W PERF (CODE STROKE)  Result Date: 05/06/2022 CLINICAL DATA:  Follow-up examination for stroke. EXAM: CT ANGIOGRAPHY HEAD AND NECK CT PERFUSION BRAIN TECHNIQUE: Multidetector CT imaging of the head and neck was performed using the standard protocol during bolus administration of intravenous contrast. Multiplanar CT image reconstructions and MIPs were obtained to evaluate the vascular anatomy. Carotid stenosis measurements (when applicable) are obtained utilizing NASCET criteria, using the distal internal carotid diameter as the denominator. Multiphase CT imaging of the brain was performed following IV bolus contrast injection. Subsequent parametric perfusion maps were calculated using RAPID software. RADIATION DOSE REDUCTION: This  exam was performed according to the departmental dose-optimization program which includes automated exposure control, adjustment of the mA and/or kV according to patient size and/or use of iterative reconstruction technique. CONTRAST:  134m OMNIPAQUE IOHEXOL 350 MG/ML SOLN COMPARISON:  Prior CT from 05/05/2022. FINDINGS: CTA NECK FINDINGS Aortic arch: Visualized aortic arch normal in caliber standard branch pattern. No stenosis about the origin the great vessels. Right carotid system: Right common and internal carotid arteries are patent without dissection. Mild atheromatous change about the right carotid bulb without hemodynamically significant greater than 50% stenosis. Left carotid system: Left common and internal carotid arteries are patent without dissection. Mild atheromatous change about the left carotid bulb without hemodynamically significant greater than 50% stenosis. Vertebral arteries: Both vertebral arteries arise from the subclavian arteries. No proximal subclavian artery stenosis. Both vertebral arteries widely patent without stenosis, dissection or occlusion. Skeleton: No discrete or worrisome osseous lesions. Os odontoideum noted. Other neck: No other acute soft tissue abnormality within the neck. Upper chest: Visualized upper chest demonstrates no acute finding. Review of the MIP images confirms the above findings CTA HEAD FINDINGS Anterior circulation: Evaluation of the intracranial circulation somewhat limited by timing the contrast bolus. Both internal carotid arteries patent to the termini without stenosis. A1 segments, anterior communicating artery complex common anterior cerebral arteries patent without  visible stenosis. No M1 stenosis or occlusion. No proximal MCA branch occlusion or high-grade stenosis. Distal MCA branches perfused and symmetric. Posterior circulation: Both V4 segments patent without stenosis. Neither PICA well visualized. Basilar patent without stenosis. Superior  cerebellar and posterior cerebral arteries patent bilaterally. Venous sinuses: Patent allowing for timing the contrast bolus. Anatomic variants: None significant.  No aneurysm. Review of the MIP images confirms the above findings CT Brain Perfusion Findings: ASPECTS: 10 CBF (<30%) Volume: 85m Perfusion (Tmax>6.0s) volume: 177mMismatch Volume: 1431mnfarction Location:Negative CT perfusion for acute ischemia. Apparent multifocal areas of delayed perfusion involving the bilateral cerebral hemispheres, measuring up to 14 mL in volume, favored to be artifactual. IMPRESSION: 1. Negative CTA for large vessel occlusion or other emergent finding. 2. Negative CT perfusion for acute ischemia. 14 mL of apparent delayed perfusion, favored to be artifactual. No definite perfusion abnormality. 3. Mild atheromatous change about the carotid bifurcations without hemodynamically significant stenosis. Electronically Signed   By: BenJeannine BogaD.   On: 05/06/2022 00:33   CT HEAD CODE STROKE WO CONTRAST  Result Date: 05/05/2022 CLINICAL DATA:  Code stroke. Initial evaluation for neuro deficit, stroke suspected. Dizziness with right-sided weakness. EXAM: CT HEAD WITHOUT CONTRAST TECHNIQUE: Contiguous axial images were obtained from the base of the skull through the vertex without intravenous contrast. RADIATION DOSE REDUCTION: This exam was performed according to the departmental dose-optimization program which includes automated exposure control, adjustment of the mA and/or kV according to patient size and/or use of iterative reconstruction technique. COMPARISON:  Comparison made with prior exams from 04/29/2021 and 04/28/2021. FINDINGS: Brain: Cerebral volume within normal limits for patient age. No evidence for acute intracranial hemorrhage. No findings to suggest acute large vessel territory infarct. No mass lesion, midline shift, or mass effect. Ventricles are normal in size without evidence for hydrocephalus. No  extra-axial fluid collection identified. Vascular: No hyperdense vessel identified. Skull: Scalp soft tissues demonstrate no acute abnormality. Calvarium intact. Sinuses/Orbits: Globes and orbital soft tissues within normal limits. Visualized paranasal sinuses are clear. No mastoid effusion. ASPECTS ([facility] Program Early CT Score) - Ganglionic level infarction (caudate, lentiform nuclei, internal capsule, insula, M1-M3 cortex): 7 - Supraganglionic infarction (M4-M6 cortex): 3 Total score (0-10 with 10 being normal): 10 IMPRESSION: 1. Negative head CT.  No acute intracranial abnormality. 2. ASPECTS is 10. Results were called by telephone at the time of interpretation on 05/05/2022 at 11:26 pm to provider HAYLEY NAASZ , who verbally acknowledged these results. Electronically Signed   By: BenJeannine BogaD.   On: 05/05/2022 23:27    PHYSICAL EXAM HEENT-  [place]/AT  Lungs - Respirations unlabored Extremities - Warm and well perfused   Neurologic Examination: Mental Status: Alert, oriented to person, place, time, location, and situation. Thought content appropriate.  Speech fluent without evidence of aphasia. No dysarthria. Able to follow all commands without difficulty. Cranial Nerves: II: Visual fields intact with no extinction, PERRL III,IV, VI: No ptosis. EOMI.  V: Temp sensation equal bilaterally  VII: Subtle right facial droop.  VIII: Hearing intact to voice IX,X: No hoarseness XI: Symmetric shoulder shrug XII: Midline tongue extension Motor: exam limited due to echo taking place RUE 4+/5- baseline LUE 5/5 RLE 4+/5- baseline LLE 5/5 Sensory: Temp and light touch intact throughout, bilaterally. No extinction to DSS.  Deep Tendon Reflexes: Hypoactive reflexes in the context of prominent muscle bulk and adiposity.  Cerebellar: No ataxia with FNF bilaterally  Gait: Deferred  ASSESSMENT/PLAN Mr. Bryam FarCoury a [age]o.  male with history of strokes, DM, HTN, morbid obesity,  sleep apnea on CPAP and depression who presented to [facility] Thursday night with acute onset of dizziness with right sided weakness associated with 2 falls at home   Stroke:  left brain infarct s/p TNK, likely small vessel disease given uncontrolled risk factors  Code Stroke CT head No acute abnormality. ASPECTS 10.    CTA head & neck No LVO MRI  Pending 2D Echo EF 60 to 65% LDL 94 HgbA1c 7.0 UDS negative VTE prophylaxis - [facility] clopidogrel 75 mg daily prior to admission, now on No antithrombotic. No antithrombotic until 24 hours post TNK Therapy recommendations:  Pending Disposition:  Pending  History of stroke 04/2021 admitted for right-sided numbness.  CT no acute abnormality.  CTA head and neck unremarkable.  MRI showed left CR small infarct.  EF 50 to 55%, LDL 115, A1c 10.8.  Discharged on DAPT and Lipitor.  Hypertension Home meds:  Norvasc, Cozaar, Toprol-XL, olmesartan, lisinopril? Resume home medications to wean off cleviprex Now on spironolactone 25, Cozaar 100, HCTZ 25, Coreg 25 twice daily, amlodipine 5. Taper off Cleviprex as able SBP less than 180/105 Long-term BP goal normotensive  Hyperlipidemia Home meds:  Atorvastatin '10mg'$ , resumed in hospital LDL 94, goal < 70 Increased to Atorvastatin '40mg'$  Continue statin at discharge  Diabetes type II Uncontrolled Home meds:  Semaglutide, farxiga, metformin HgbA1c 7.0, goal < 7.0 CBGs SSI Diabetic coordinator on board  Other Stroke Risk Factors Obesity, Body mass index is 42.57 kg/m., BMI >/= 30 associated with increased stroke risk, recommend weight loss, diet and exercise as appropriate  Obstructive sleep apnea, on CPAP at home    Hospital day # 0   Rosalin Hawking, MD PhD Stroke Neurology 05/06/2022 6:36 PM  This patient is critically ill due to stroke, history of stroke, status post TNK and at significant risk of neurological worsening, death form recurrent stroke, hemorrhagic transformation, bleeding from TNK. This  patient's care requires constant monitoring of vital signs, hemodynamics, respiratory and cardiac monitoring, review of multiple databases, neurological assessment, discussion with family, other specialists and medical decision making of high complexity. I spent 30 minutes of neurocritical care time in the care of this patient.     To contact Stroke Continuity provider, please refer to [email or web address] After hours, contact General Neurology

## 2022-05-06 NOTE — Consult Note (Addendum)
[facility] TeleNeurology Consult Services   Patient Name:   Christopher Roberson, Christopher Roberson Date of Birth:   February 07, 1976 Identification Number:   MRN - [id number] Date of Service:   05/05/2022 23:12:37  Diagnosis:       I63.9 - Cerebrovascular accident (CVA), unspecified mechanism ([place])  Impression:      47 yo M with history of stroke (with residual mild right hemiparesis) who presented for evaluation of dizziness, gait unsteadiness, and right-sided numbness/weakness. His neurologic exam was notable for right upper and lower extremity drift with right face/hemibody hypoesthesia. Presentation is concerning for possible new acute stroke vs recrudescence of prior stroke symptoms. After extensive discussion regarding the risks/benefits and alternatives of TNK with patient (and later his spouse via telephone), patient ultimately elected to receive thrombolysis (received at 11:47 PM). Of note, there was significant delay (around 20 minutes) in TNK administration due to refractory hypertension. Otherwise, CTA head/neck currently pending to rule out LVO (will place addendum once results are reviewed). Recommend patient be admitted for post-TNK care per hospital protocol and additional stroke workup (e.g., MRI, TTE, telemetry, etc.).  Addendum: CTA head/neck and CTP reviewed without evidence of proximal intracranial LVO. Patient is not a thrombectomy candidate.  Our recommendations are outlined below. Recommendations: IV tenectaplase recommended.  I confirmed the following. (Patient name, DOB, MRN, Blood Pressure, dose of Thrombolytic and waste, weight completed by stretcher/scale not stated weight, have ED staff inform ED MD of thrombolytic decision) Thrombolytic bolus given Without Complication.   IV tenecteplase Total Dose - 25.0 mg (5 mL) IV tenecteplase Discard Dose - 25.0 mg (5 mL)  Routine post Thrombolytic monitoring including neuro checks and blood pressure control during/after  treatment Monitor blood pressure Check blood pressure and neuro assessment every 15 min for 2 h, then every 30 min for 6 h, and finally every hour for 16 h.  Manage Blood Pressure per post Thrombolytic protocol.        Follow designated hospital protocol for admission and post thrombolytic care       CT brain 24 hours post Thrombolytic       NPO until swallowing screen performed and passed       No antiplatelet agents or anticoagulants (including heparin for DVT prophylaxis) in first 24 hours       No Foley catheter, nasogastric tube, arterial catheter or central venous catheter for 24 hr, unless absolutely necessary       Telemetry       Bedside swallow evaluation       HOB less than 30 degrees       Euglycemia       Avoid hyperthermia, PRN acetaminophen       DVT prophylaxis       Inpatient Neurology Consultation       Stroke evaluation as per inpatient neurology recommendations  Discussed with ED physician    ------------------------------------------------------------------------------  Advanced Imaging: Advanced imaging has been ordered. Results pending.   Metrics: Last Known Well: 05/05/2022 20:00:00 [facility] Notification Time: 05/05/2022 23:12:37 Arrival Time: 05/05/2022 22:28:00 Stamp Time: 05/05/2022 23:12:37 Initial Response Time: 05/05/2022 23:14:01 Symptoms: dizziness and right-sided weakness/numbness. Initial patient interaction: 05/05/2022 23:16:03 NIHSS Assessment Completed: 05/05/2022 23:24:11 Patient is a candidate for Thrombolytic. Thrombolytic Medical Decision: 05/05/2022 23:30:05 Needle Time: 05/05/2022 23:47:00 Weight Noted by Staff: 127 kg  I personally Reviewed the CT Head and it Showed no acute process  Primary Provider Notified of Diagnostic Impression and Management Plan on: 05/06/2022 00:02:08    ------------------------------------------------------------------------------  Thrombolytic Contraindications:  Last Known Well > 4.5  hours: No CT Head showing hemorrhage: No Ischemic stroke within 3 months: No Severe head trauma within 3 months: No Intracranial/intraspinal surgery within 3 months: No History of intracranial hemorrhage: No Symptoms and signs consistent with an SAH: No GI malignancy or GI bleed within 21 days: No Coagulopathy: Platelets <100 000 /mm3, INR >1.7, aPTT>40 s, or PT >15 s: No Treatment dose of LMWH within the previous 24 hrs: No Use of NOACs in past 48 hours: No Glycoprotein IIb/IIIa receptor inhibitors use: No Symptoms consistent with infective endocarditis: No Suspected aortic arch dissection: No Intra-axial intracranial neoplasm: No  Thrombolytic Decision and Management Plan: Management with thrombolytic treatment was explained to the Patient and Family as was risks and benefits and alternatives to the treatment. Patient agrees with the decision to proceed with thrombolytic treatment. . All questions were answered and the Patient and Family expressed understanding of the treatment plan.   History of Present Illness: Patient is a 47 year old Male.  Patient was brought by private transportation with symptoms of dizziness and right-sided weakness/numbness. Patient was last known normal around 8 PM this evening. Around this time, he developed acute onset of dizziness (described as a spinning sensation), gait instability with 2 falls, right upper and lower extremity weakness/numbness. He reports similar symptoms in the past with a prior stroke in 04/2021. He denied any headache, vision changes, speech changes, or left-sided deficits.   Past Medical History:      Hypertension      Diabetes Mellitus      Stroke [place] PMH:  OSA  Medications:  No Anticoagulant use  Antiplatelet use: Yes Plavix Reviewed EMR for current medications  Allergies:  Reviewed Description: Percocet  Social History: Smoking: No  Family History:  There is no family history of premature cerebrovascular  disease pertinent to this consultation  ROS : 14 Points Review of Systems was performed and was negative except mentioned in HPI.  Past Surgical History: There Is No Surgical History Contributory To Today's Visit   Examination: BP(176/92), Pulse(73), Blood Glucose(114) 1A: Level of Consciousness - Alert; keenly responsive + 0 1B: Ask Month and Age - Both Questions Right + 0 1C: Blink Eyes & Squeeze Hands - Performs Both Tasks + 0 2: Test Horizontal Extraocular Movements - Normal + 0 3: Test Visual Fields - No Visual Loss + 0 4: Test Facial Palsy (Use Grimace if Obtunded) - Normal symmetry + 0 5A: Test Left Arm Motor Drift - No Drift for 10 Seconds + 0 5B: Test Right Arm Motor Drift - Drift, but doesn't hit bed + 1 6A: Test Left Leg Motor Drift - No Drift for 5 Seconds + 0 6B: Test Right Leg Motor Drift - Drift, but doesn't hit bed + 1 7: Test Limb Ataxia (FNF/Heel-Shin) - No Ataxia + 0 8: Test Sensation - Mild-Moderate Loss: Less Sharp/More Dull + 1 9: Test Language/Aphasia - Normal; No aphasia + 0 10: Test Dysarthria - Normal + 0 11: Test Extinction/Inattention - No abnormality + 0  NIHSS Score: 3 NIHSS Free Text : Right face and hemibody hypoesthesia  Pre-Morbid Modified Rankin Scale: 3 Points = Moderate disability; requiring some help, but able to walk without assistance  Spoke with : Dr. Stark Jock  Patient/Family was informed the Neurology Consult would occur via TeleHealth consult by way of interactive audio and video telecommunications and consented to receiving care in this manner.   Patient is being evaluated for possible acute neurologic impairment and  high probability of imminent or life-threatening deterioration. I spent total of 50 minutes providing care to this patient, including time for face to face visit via telemedicine, review of medical records, imaging studies and discussion of findings with providers, the patient and/or family.   Dr Larkin Ina  De'Prey   [facility] For Inpatient follow-up with [facility] physician please call [facility] [phone]. This is not an outpatient service. Post hospital discharge, please contact hospital directly.  Please do not communicate with [facility] physicians via secure chat. If you have any questions, Please contact [facility].

## 2022-05-06 NOTE — Progress Notes (Incomplete)
  Echocardiogram 2D Echocardiogram has been performed.  Wynelle Link 05/06/2022, 11:15 AM

## 2022-05-06 NOTE — ED Provider Notes (Signed)
Physical Exam  BP (!) 152/89   Pulse 76   Temp (!) 97.5 F (36.4 C) (Axillary)   Resp 13   Ht '5\' 8"'$  (1.727 m)   Wt 127 kg   SpO2 97%   BMI 42.57 kg/m   Physical Exam Vitals and nursing note reviewed.  Constitutional:      General: He is not in acute distress.    Appearance: He is well-developed. He is not diaphoretic.  HENT:     Head: Normocephalic and atraumatic.  Eyes:     Extraocular Movements: Extraocular movements intact.     Pupils: Pupils are equal, round, and reactive to light.  Cardiovascular:     Rate and Rhythm: Normal rate and regular rhythm.     Heart sounds: No murmur heard.    No friction rub.  Pulmonary:     Effort: Pulmonary effort is normal. No respiratory distress.     Breath sounds: Normal breath sounds. No wheezing or rales.  Abdominal:     General: Bowel sounds are normal. There is no distension.     Palpations: Abdomen is soft.     Tenderness: There is no abdominal tenderness.  Musculoskeletal:        General: Normal range of motion.     Cervical back: Normal range of motion and neck supple.  Skin:    General: Skin is warm and dry.  Neurological:     Mental Status: He is alert and oriented to person, place, and time.     Motor: Weakness present.     Coordination: Coordination normal.     Comments: To my exam, there is a mild right-sided facial droop noted.  He also has 4+ out of 5 strength in the right arm and right leg.  Neurologic exam otherwise unremarkable.     Procedures  Procedures  ED Course / MDM   Clinical Course as of 05/06/22 0338  Thu May 05, 2022  2303 BP(!): 199/108 [HN]  07-23-2301 Glucose-Capillary(!): 114 [HN]  07/24/02 Patient stroke coded immediately from triage. Telestroke evaluating now. [HN]    Clinical Course User Index [HN] Audley Hose, MD   Care assumed from Dr. Mayra Neer at shift change.  Patient with prior CVA approximately 1 year ago presenting with acute onset of right arm and right leg weakness causing him to  fall.  Symptoms began at approximately 8 PM.  Patient arrived here and a code stroke was immediately initiated in triage.  Initial Head CT unremarkable.  CTA of the head and neck negative for LVO.  I assumed care awaiting results of laboratory studies and teleneurology consultation.  Patient has been recommended to receive TNK.  His blood pressures were initially over 200 and required doses of labetalol, hydralazine, followed by a Cleviprex drip.  Blood pressure is now in the 170 range and thrombolytics administered.  Laboratory studies have returned and are basically unremarkable.    Care discussed with both the Teleneurology physician and Dr. Cheral Marker who was on call for Neurology at [facility].  Patient to be admitted to the Neuro ICU for close monitoring.    CRITICAL CARE Performed by: Veryl Speak Total critical care time: 40 minutes Critical care time was exclusive of separately billable procedures and treating other patients. Critical care was necessary to treat or prevent imminent or life-threatening deterioration. Critical care was time spent personally by me on the following activities: development of treatment plan with patient and/or surrogate as well as nursing, discussions with consultants, evaluation of  patient's response to treatment, examination of patient, obtaining history from patient or surrogate, ordering and performing treatments and interventions, ordering and review of laboratory studies, ordering and review of radiographic studies, pulse oximetry and re-evaluation of patient's condition.        Veryl Speak, MD 05/06/22 [phone]

## 2022-05-06 NOTE — TOC Progression Note (Signed)
Transition of Care [facility]) - Initial/Assessment Note    Patient Details  Name: Christopher Roberson MRN: [id number] Date of Birth: 23-Aug-1975  Transition of [facility]) CM/SW Contact:    Milinda Antis, [place] Phone Number: 05/06/2022, 4:00 PM  Clinical Narrative:                 TOC following patient for any d/c planning needs once medically stable.  PT/OT assessments and recommendations pending as patient is currently on bed rest.     Lind Covert, MSW, LCSW   Patient Goals and CMS Choice            Expected Discharge Plan and Services                                              Prior Living Arrangements/Services                       Activities of Daily Living      Permission Sought/Granted                  Emotional Assessment              Admission diagnosis:  Acute CVA (cerebrovascular accident) ([place]) [I63.9] Stroke [facility]) [I63.9] Patient Active Problem List   Diagnosis Date Noted   Stroke ([place]) 05/06/2022   Weakness of right upper extremity 05/22/2021   HLD (hyperlipidemia) 04/29/2021   Acute CVA (cerebrovascular accident) ([place]) 04/29/2021   Compression of right ulnar nerve at multiple levels 07/30/2020   Paresthesias 05/22/2020   Carpal tunnel syndrome of right wrist 02/26/2020   Hypomagnesemia 03/04/2019   Ulnar neuropathy of right upper extremity 03/04/2019   Ulnar nerve compression, left 02/11/2019   Status post placement of implantable loop recorder 12/07/2018   Degenerative cervical disc 11/04/2018   COVID-19 virus infection    Suspected COVID-19 virus infection 10/09/2018   Viral gastroenteritis 10/09/2018   Acute respiratory failure with hypoxia ([place]) 10/08/2018   Medication management 08/27/2018   Cerebellar cerebrovascular accident (CVA) without late effect 05/09/2018   Bell's palsy 05/09/2018   Current use of insulin ([place]) 03/19/2018   Dry eye syndrome of both lacrimal glands 03/19/2018   Vitreous floaters of  both eyes 03/19/2018   Premature ejaculation, acquired, generalized, moderate 02/26/2018   Gross hematuria 02/19/2018   Moderate episode of recurrent major depressive disorder ([place]) 02/06/2018   Grief 02/05/2018   SI (sacroiliac) pain 11/30/2017   GAD (generalized anxiety disorder) 07/20/2017   Severe nonproliferative diabetic retinopathy of both eyes without macular edema associated with type 2 diabetes mellitus ([place]) 07/20/2017   Thoracic radiculopathy 02/21/2017   Chronic right-sided low back pain with right-sided sciatica 02/16/2017   Alcohol abuse 11/01/2016   Intentional drug overdose ([place]) 10/31/2016   Severe episode of recurrent major depressive disorder, without psychotic features ([place]) 10/31/2016   Chronic pain of right knee 03/24/2016   Palpitations 08/17/2015   Vitamin D deficiency 06/24/2015   Adult situational stress disorder 06/10/2015   Recurrent depression ([place]) 42/70/6237   Umbilical hernia 62/83/1517   Acute pancreatitis 06/02/2015   Personality disorder ([place]) 04/03/2015   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 [place] [address] Phone: [phone] Fax: [phone]     Social Determinants of Health (SDOH) Social History: SDOH Screenings   Food Insecurity: No Food Insecurity (05/31/2021)  Housing: Low Risk  (05/31/2021)  Transportation Needs: No Transportation Needs (05/31/2021)  Depression (PHQ2-9): Low Risk  (05/31/2021)  Financial Resource Strain: Low Risk  (05/20/2021)  Stress: No Stress Concern  Present (05/31/2021)  Tobacco Use: Low Risk  (05/05/2022)   SDOH Interventions:     Readmission Risk Interventions     No data to display

## 2022-05-06 NOTE — Progress Notes (Signed)
PT Cancellation Note  Patient Details Name: Christopher Roberson MRN: [id number] DOB: [birth year]   Cancelled Treatment:    Reason Eval/Treat Not Completed: Active bedrest order   Sandy Salaam Carron Jaggi 05/06/2022, 6:41 AM [place] Office: [phone]

## 2022-05-06 NOTE — Progress Notes (Signed)
OT Cancellation Note  Patient Details Name: Christopher Roberson MRN: [id number] DOB: Apr 16, 1976   Cancelled Treatment:    Reason Eval/Treat Not Completed: Active bedrest order (Will assess when activity orders updated. thanks)  [facility] 05/06/2022, 7:31 AM Maurie Boettcher, OT/L   Acute OT Clinical Specialist Acute Rehabilitation Services Pager [phone] Office [phone]

## 2022-05-06 NOTE — ED Notes (Signed)
Report given to brianna scott, RN at [facility], [facility] here for pt transport. Wife notified of pt bed assignment.

## 2022-05-06 NOTE — H&P (Signed)
Admission H&P    Chief Complaint: Acute onset of dizziness with falls  HPI: Christopher Roberson is a 47 y.o. male with a PMHx of strokes, DM, HTN, morbid obesity, sleep apnea on CPAP and depression who presented to [facility] Thursday night with acute onset of dizziness with right sided weakness associated with 2 falls at home in the prior two hours. NIHSS was 4 in triage, with exam showing right sided sensory deficit with right sided weakness and drift. He endorsed an 8/10 throbbing headache at presentation. Code Stroke was called and Teleneurology was consulted.   Teleneurology note has been reviewed: "47 yo M with history of stroke (with residual mild right hemiparesis) who presented for evaluation of dizziness, gait unsteadiness, and right-sided numbness/weakness. His neurologic exam was notable for right upper and lower extremity drift with right face/hemibody hypoesthesia. Presentation is concerning for possible new acute stroke vs recrudescence of prior stroke symptoms. After extensive discussion regarding the risks/benefits and alternatives of TNK with patient (and later his spouse via telephone), patient ultimately elected to receive thrombolysis (received at 11:47 PM). Of note, there was significant delay (around 20 minutes) in TNK administration due to refractory hypertension. Otherwise, CTA head/neck currently pending to rule out LVO (will place addendum once results are reviewed). Recommend patient be admitted for post-TNK care per hospital protocol and additional stroke workup (e.g., MRI, TTE, telemetry, etc.)."  He was deemed to be a candidate for TNK, following administration of which he was transferred to [facility] for stroke work up. CTA of head and neck with CTP while at [facility] revealed no evidence for LVO.  Past Medical History:  Diagnosis Date   Depression    Diabetes mellitus    Hypertension    Obesity    Sleep apnea    On CPAP machine   Stroke [facility])     Past Surgical History:  Procedure  Laterality Date   APPENDECTOMY     CARPAL TUNNEL RELEASE     ESOPHAGOGASTRODUODENOSCOPY     [facility] around 2015-2016   HERNIA REPAIR      Family History  Problem Relation Age of Onset   Heart disease Mother    Colon cancer Father    Colon cancer Paternal Uncle    Prostate cancer Paternal Uncle    Prostate cancer Paternal Uncle    Cancer Paternal Grandmother    Prostate cancer Paternal Grandfather    Esophageal cancer Neg Hx    Rectal cancer Neg Hx    Stomach cancer Neg Hx    Social History:  reports that he has never smoked. He has never used smokeless tobacco. He reports that he does not drink alcohol and does not use drugs.  Allergies:  Allergies  Allergen Reactions   Percocet [Oxycodone-Acetaminophen] Itching    Can take if takes with Benadryl    Medications Prior to Admission  Medication Sig Dispense Refill   albuterol (VENTOLIN HFA) 108 (90 Base) MCG/ACT inhaler Inhale 2 puffs into the lungs every 4 (four) hours as needed for wheezing or shortness of breath.     amLODipine (NORVASC) 5 MG tablet Take 5 mg by mouth daily.     atorvastatin (LIPITOR) 80 MG tablet Take 1 tablet (80 mg total) by mouth every morning. 30 tablet 1   blood glucose meter kit and supplies KIT Dispense based on patient and insurance preference. Use up to four times daily as directed. 1 each 0   carvedilol (COREG) 25 MG tablet Take 25 mg by mouth 2 (  two) times daily.     chlorthalidone (HYGROTON) 25 MG tablet Take 25 mg by mouth daily.     clopidogrel (PLAVIX) 75 MG tablet Take 1 tablet (75 mg total) by mouth daily. 30 tablet 3   dapagliflozin propanediol (FARXIGA) 5 MG TABS tablet Take 5 mg by mouth daily.     insulin glargine (LANTUS SOLOSTAR) 100 UNIT/ML Solostar Pen Take 30units every morning and 45units every night (Patient not taking: Reported on 11/18/2021) 15 mL 11   Insulin Pen Needle 32G X 4 MM MISC Use as directed 100 each 2   lisinopril (ZESTRIL) 40 MG tablet Take 1 tablet (40  mg total) by mouth every morning. Resume in  1 week (Patient not taking: Reported on 08/20/2021)     losartan (COZAAR) 50 MG tablet Take 50 mg by mouth daily.     metFORMIN (GLUCOPHAGE) 1000 MG tablet Take 1 tablet (1,000 mg total) by mouth 2 (two) times daily with a meal. 60 tablet 2   methocarbamol (ROBAXIN) 500 MG tablet Take 1 tablet (500 mg total) by mouth at bedtime as needed for muscle spasms. 20 tablet 0   metoprolol succinate (TOPROL-XL) 50 MG 24 hr tablet Take 1 tablet (50 mg total) by mouth daily. 30 tablet 2   olmesartan (BENICAR) 20 MG tablet Take 20 mg by mouth daily.     pregabalin (LYRICA) 150 MG capsule Take 150 mg by mouth 2 (two) times daily as needed (pain).     Semaglutide,0.25 or 0.'5MG'$ /DOS, (OZEMPIC, 0.25 OR 0.5 MG/DOSE,) 2 MG/1.5ML SOPN Inject 0.25 mg into the skin every Monday.     sertraline (ZOLOFT) 50 MG tablet Take 50 mg by mouth daily.     spironolactone (ALDACTONE) 25 MG tablet Take 25 mg by mouth daily.     SURE COMFORT PEN NEEDLES 31G X 8 MM MISC See admin instructions.     terazosin (HYTRIN) 5 MG capsule Take 5 mg by mouth at bedtime.     traZODone (DESYREL) 100 MG tablet Take 100-300 mg by mouth at bedtime as needed for sleep.      ROS: As per HPI. Does not endorse any additional symptoms at the time of bedside neurology evaluation.    Physical Examination: Blood pressure (!) 153/95, pulse 78, temperature 98.5 F (36.9 C), temperature source Oral, resp. rate 15, height '5\' 8"'$  (1.727 m), weight 127 kg, SpO2 99 %.  HEENT-  [place]/AT  Lungs - Respirations unlabored Extremities - Warm and well perfused  Neurologic Examination: Mental Status: Alert, oriented x 5, thought content appropriate.  Speech fluent without evidence of aphasia. No dysarthria. Able to follow all commands without difficulty. Cranial Nerves: II: Temporal visual fields intact with no extinction to DSS. PERRL  III,IV, VI: No ptosis. EOMI.  V: Temp sensation equal bilaterally  VII: Subtle right  facial droop.  VIII: Hearing intact to voice IX,X: No hoarseness XI: Symmetric shoulder shrug XII: Midline tongue extension Motor: RUE 5/5 except for 4/5 triceps strength.  LUE 5/5 No pronator drift.  Positive orbiting fingers test on the right RLE with subtle 4+/5 weakness proximally LLE 5/5 Sensory: Temp and light touch intact throughout, bilaterally. No extinction to DSS.  Deep Tendon Reflexes: Hypoactive reflexes in the context of prominent muscle bulk and adiposity.  Cerebellar: No ataxia with FNF bilaterally  Gait: Deferred  Results for orders placed or performed during the hospital encounter of 05/05/22 (from the past 48 hour(s))  CBG monitoring, ED     Status: Abnormal  Collection Time: 05/05/22 10:59 PM  Result Value Ref Range   Glucose-Capillary 114 (H) 70 - 99 mg/dL    Comment: Glucose reference range applies only to samples taken after fasting for at least 8 hours.  Urine rapid drug screen (hosp performed)     Status: None   Collection Time: 05/05/22 11:04 PM  Result Value Ref Range   Opiates NONE DETECTED NONE DETECTED   Cocaine NONE DETECTED NONE DETECTED   Benzodiazepines NONE DETECTED NONE DETECTED   Amphetamines NONE DETECTED NONE DETECTED   Tetrahydrocannabinol NONE DETECTED NONE DETECTED   Barbiturates NONE DETECTED NONE DETECTED    Comment: (NOTE) DRUG SCREEN FOR MEDICAL PURPOSES ONLY.  IF CONFIRMATION IS NEEDED FOR ANY PURPOSE, NOTIFY LAB WITHIN 5 DAYS.  LOWEST DETECTABLE LIMITS FOR URINE DRUG SCREEN Drug Class                     Cutoff (ng/mL) Amphetamine and metabolites    1000 Barbiturate and metabolites    200 Benzodiazepine                 200 Opiates and metabolites        300 Cocaine and metabolites        300 THC                            50 Performed at [facility], [place], [place] [address]   Urinalysis, Routine w reflex microscopic Urine, Clean Catch     Status: Abnormal   Collection Time:  05/05/22 11:04 PM  Result Value Ref Range   Color, Urine COLORLESS (A) YELLOW   APPearance CLEAR CLEAR   Specific Gravity, Urine 1.010 1.005 - 1.030   pH 7.0 5.0 - 8.0   Glucose, UA NEGATIVE NEGATIVE mg/dL   Hgb urine dipstick NEGATIVE NEGATIVE   Bilirubin Urine NEGATIVE NEGATIVE   Ketones, ur NEGATIVE NEGATIVE mg/dL   Protein, ur NEGATIVE NEGATIVE mg/dL   Nitrite NEGATIVE NEGATIVE   Leukocytes,Ua NEGATIVE NEGATIVE    Comment: Performed at [facility], [place], [place] [address]  Ethanol     Status: None   Collection Time: 05/05/22 11:20 PM  Result Value Ref Range   Alcohol, Ethyl (B) <10 <10 mg/dL    Comment: (NOTE) Lowest detectable limit for serum alcohol is 10 mg/dL.  For medical purposes only. Performed at [facility], [address], [place], [place] [address]   Protime-INR     Status: None   Collection Time: 05/05/22 11:20 PM  Result Value Ref Range   Prothrombin Time 13.2 11.4 - 15.2 seconds   INR 1.0 0.8 - 1.2    Comment: (NOTE) INR goal varies based on device and disease states. Performed at [facility], [address] [place], [place] [address]   APTT     Status: None   Collection Time: 05/05/22 11:20 PM  Result Value Ref Range   aPTT 28 24 - 36 seconds    Comment: Performed at [facility], [address] [place], [place] [address]  CBC     Status: Abnormal   Collection Time: 05/05/22 11:20 PM  Result Value Ref Range   WBC 7.4 4.0 - 10.5 K/uL   RBC 4.26 4.22 - 5.81 MIL/uL   Hemoglobin 12.5 (L) 13.0 - 17.0 g/dL   HCT 37.5 (L) 39.0 - 52.0 %   MCV 88.0 80.0 -  100.0 fL   MCH 29.3 26.0 - 34.0 pg   MCHC 33.3 30.0 - 36.0 g/dL   RDW 12.9 11.5 - 15.5 %   Platelets 284 150 - 400 K/uL   nRBC 0.0 0.0 - 0.2 %    Comment: Performed at [facility], [address], [place], [place] [address]  Differential     Status: None   Collection Time:  05/05/22 11:20 PM  Result Value Ref Range   Neutrophils Relative % 58 %   Neutro Abs 4.3 1.7 - 7.7 K/uL   Lymphocytes Relative 30 %   Lymphs Abs 2.2 0.7 - 4.0 K/uL   Monocytes Relative 9 %   Monocytes Absolute 0.6 0.1 - 1.0 K/uL   Eosinophils Relative 3 %   Eosinophils Absolute 0.2 0.0 - 0.5 K/uL   Basophils Relative 0 %   Basophils Absolute 0.0 0.0 - 0.1 K/uL   Immature Granulocytes 0 %   Abs Immature Granulocytes 0.02 0.00 - 0.07 K/uL    Comment: Performed at [facility], [address] [place], [place] [address]  Comprehensive metabolic panel     Status: Abnormal   Collection Time: 05/05/22 11:20 PM  Result Value Ref Range   Sodium 140 135 - 145 mmol/L   Potassium 3.2 (L) 3.5 - 5.1 mmol/L   Chloride 104 98 - 111 mmol/L   CO2 27 22 - 32 mmol/L   Glucose, Bld 123 (H) 70 - 99 mg/dL    Comment: Glucose reference range applies only to samples taken after fasting for at least 8 hours.   BUN 10 6 - 20 mg/dL   Creatinine, Ser 0.84 0.61 - 1.24 mg/dL   Calcium 9.5 8.9 - 10.3 mg/dL   Total Protein 7.6 6.5 - 8.1 g/dL   Albumin 4.3 3.5 - 5.0 g/dL   AST 16 15 - 41 U/L   ALT 15 0 - 44 U/L   Alkaline Phosphatase 42 38 - 126 U/L   Total Bilirubin 0.9 0.3 - 1.2 mg/dL   GFR, Estimated >60 >60 mL/min    Comment: (NOTE) Calculated using the CKD-EPI Creatinine Equation (2021)    Anion gap 9 5 - 15    Comment: Performed at [facility], [place], [place] [address]  Glucose, capillary     Status: Abnormal   Collection Time: 05/06/22  2:02 AM  Result Value Ref Range   Glucose-Capillary 126 (H) 70 - 99 mg/dL    Comment: Glucose reference range applies only to samples taken after fasting for at least 8 hours.   CT ANGIO HEAD NECK W WO CM W PERF (CODE STROKE)  Result Date: 05/06/2022 CLINICAL DATA:  Follow-up examination for stroke. EXAM: CT ANGIOGRAPHY HEAD AND NECK CT PERFUSION BRAIN TECHNIQUE: Multidetector CT imaging of the head and  neck was performed using the standard protocol during bolus administration of intravenous contrast. Multiplanar CT image reconstructions and MIPs were obtained to evaluate the vascular anatomy. Carotid stenosis measurements (when applicable) are obtained utilizing NASCET criteria, using the distal internal carotid diameter as the denominator. Multiphase CT imaging of the brain was performed following IV bolus contrast injection. Subsequent parametric perfusion maps were calculated using RAPID software. RADIATION DOSE REDUCTION: This exam was performed according to the departmental dose-optimization program which includes automated exposure control, adjustment of the mA and/or kV according to patient size and/or use of iterative reconstruction technique. CONTRAST:  147m OMNIPAQUE IOHEXOL 350 MG/ML SOLN COMPARISON:  Prior CT from 05/05/2022. FINDINGS: CTA  NECK FINDINGS Aortic arch: Visualized aortic arch normal in caliber standard branch pattern. No stenosis about the origin the great vessels. Right carotid system: Right common and internal carotid arteries are patent without dissection. Mild atheromatous change about the right carotid bulb without hemodynamically significant greater than 50% stenosis. Left carotid system: Left common and internal carotid arteries are patent without dissection. Mild atheromatous change about the left carotid bulb without hemodynamically significant greater than 50% stenosis. Vertebral arteries: Both vertebral arteries arise from the subclavian arteries. No proximal subclavian artery stenosis. Both vertebral arteries widely patent without stenosis, dissection or occlusion. Skeleton: No discrete or worrisome osseous lesions. Os odontoideum noted. Other neck: No other acute soft tissue abnormality within the neck. Upper chest: Visualized upper chest demonstrates no acute finding. Review of the MIP images confirms the above findings CTA HEAD FINDINGS Anterior circulation: Evaluation of  the intracranial circulation somewhat limited by timing the contrast bolus. Both internal carotid arteries patent to the termini without stenosis. A1 segments, anterior communicating artery complex common anterior cerebral arteries patent without visible stenosis. No M1 stenosis or occlusion. No proximal MCA branch occlusion or high-grade stenosis. Distal MCA branches perfused and symmetric. Posterior circulation: Both V4 segments patent without stenosis. Neither PICA well visualized. Basilar patent without stenosis. Superior cerebellar and posterior cerebral arteries patent bilaterally. Venous sinuses: Patent allowing for timing the contrast bolus. Anatomic variants: None significant.  No aneurysm. Review of the MIP images confirms the above findings CT Brain Perfusion Findings: ASPECTS: 10 CBF (<30%) Volume: 75m Perfusion (Tmax>6.0s) volume: 119mMismatch Volume: 1470mnfarction Location:Negative CT perfusion for acute ischemia. Apparent multifocal areas of delayed perfusion involving the bilateral cerebral hemispheres, measuring up to 14 mL in volume, favored to be artifactual. IMPRESSION: 1. Negative CTA for large vessel occlusion or other emergent finding. 2. Negative CT perfusion for acute ischemia. 14 mL of apparent delayed perfusion, favored to be artifactual. No definite perfusion abnormality. 3. Mild atheromatous change about the carotid bifurcations without hemodynamically significant stenosis. Electronically Signed   By: BenJeannine BogaD.   On: 05/06/2022 00:33   CT HEAD CODE STROKE WO CONTRAST  Result Date: 05/05/2022 CLINICAL DATA:  Code stroke. Initial evaluation for neuro deficit, stroke suspected. Dizziness with right-sided weakness. EXAM: CT HEAD WITHOUT CONTRAST TECHNIQUE: Contiguous axial images were obtained from the base of the skull through the vertex without intravenous contrast. RADIATION DOSE REDUCTION: This exam was performed according to the departmental dose-optimization  program which includes automated exposure control, adjustment of the mA and/or kV according to patient size and/or use of iterative reconstruction technique. COMPARISON:  Comparison made with prior exams from 04/29/2021 and 04/28/2021. FINDINGS: Brain: Cerebral volume within normal limits for patient age. No evidence for acute intracranial hemorrhage. No findings to suggest acute large vessel territory infarct. No mass lesion, midline shift, or mass effect. Ventricles are normal in size without evidence for hydrocephalus. No extra-axial fluid collection identified. Vascular: No hyperdense vessel identified. Skull: Scalp soft tissues demonstrate no acute abnormality. Calvarium intact. Sinuses/Orbits: Globes and orbital soft tissues within normal limits. Visualized paranasal sinuses are clear. No mastoid effusion. ASPECTS ([facility] Program Early CT Score) - Ganglionic level infarction (caudate, lentiform nuclei, internal capsule, insula, M1-M3 cortex): 7 - Supraganglionic infarction (M4-M6 cortex): 3 Total score (0-10 with 10 being normal): 10 IMPRESSION: 1. Negative head CT.  No acute intracranial abnormality. 2. ASPECTS is 10. Results were called by telephone at the time of interpretation on 05/05/2022 at 11:26 pm to provider HAYLEY NAASZ ,  who verbally acknowledged these results. Electronically Signed   By: Jeannine Boga M.D.   On: 05/05/2022 23:27     Assessment: 47 y.o. male with a PMHx of strokes, DM, HTN, morbid obesity, sleep apnea on CPAP and depression who presented to [facility] Thursday night with acute onset of dizziness with right sided weakness associated with 2 falls at home in the prior two hours. NIHSS was 4 in triage, with exam showing right sided sensory deficit with right sided weakness and drift. He endorsed an 8/10 throbbing headache at presentation. Code Stroke was called and Teleneurology was consulted. He was deemed to be a candidate for TNK, following administration of which he was  transferred to [facility] for stroke work up. CTA of head and neck with CTP while at [facility] revealed no evidence for LVO. - Exam reveals subtle right facial droop and right triceps weakness, which patient and wife state are chronic since his prior stroke last year.  - CT head: Negative head CT.  No acute intracranial abnormality. ASPECTS is 10.  - CTA of head and neck: Negative CTA for large vessel occlusion or other emergent finding. Negative CT perfusion for acute ischemia. 14 mL of apparent delayed perfusion, favored to be artifactual. No definite perfusion abnormality. Mild atheromatous change about the carotid bifurcations without hemodynamically significant stenosis.  Plan: 1. Admitting to the ICU under the Neurology service.  2. Post-TNK order set to include frequent neuro checks and BP management.  3. No antiplatelet medications or anticoagulants for at least 24 hours following TNK.  4. DVT prophylaxis with SCDs.  5. Continue atorvastatin.  6. Will need escalation of antiplatelet therapy, by addition of ASA to his home Plavix regimen, if follow up CT at 24 hours is negative for hemorrhagic conversion. 7. Cardiac telemetry  8. TTE.  9. MRI brain  10. PT/OT/Speech.  11. NPO until passes swallow evaluation.  12. Sliding scale insulin.  13. Fasting lipid panel, HgbA1c 14. Risk factor modification   Electronically signed: Dr. Kerney Elbe 05/06/2022, 2:06 AM

## 2022-05-06 NOTE — Plan of Care (Signed)
  Problem: Education: Goal: Knowledge of General Education information will improve Description: Including pain rating scale, medication(s)/side effects and non-pharmacologic comfort measures Outcome: Progressing   Problem: Health Behavior/Discharge Planning: Goal: Ability to manage health-related needs will improve Outcome: Progressing   Problem: Clinical Measurements: Goal: Ability to maintain clinical measurements within normal limits will improve Outcome: Progressing Goal: Will remain free from infection Outcome: Progressing Goal: Diagnostic test results will improve Outcome: Progressing Goal: Respiratory complications will improve Outcome: Progressing Goal: Cardiovascular complication will be avoided Outcome: Progressing   Problem: Activity: Goal: Risk for activity intolerance will decrease Outcome: Progressing   Problem: Nutrition: Goal: Adequate nutrition will be maintained Outcome: Progressing   Problem: Coping: Goal: Level of anxiety will decrease Outcome: Progressing   Problem: Elimination: Goal: Will not experience complications related to bowel motility Outcome: Progressing Goal: Will not experience complications related to urinary retention Outcome: Progressing   Problem: Pain Managment: Goal: General experience of comfort will improve Outcome: Progressing   Problem: Safety: Goal: Ability to remain free from injury will improve Outcome: Progressing   Problem: Skin Integrity: Goal: Risk for impaired skin integrity will decrease Outcome: Progressing   Problem: Education: Goal: Ability to describe self-care measures that may prevent or decrease complications (Diabetes Survival Skills Education) will improve Outcome: Progressing Goal: Individualized Educational Video(s) Outcome: Progressing   Problem: Coping: Goal: Ability to adjust to condition or change in health will improve Outcome: Progressing   Problem: Fluid Volume: Goal: Ability to  maintain a balanced intake and output will improve Outcome: Progressing   Problem: Health Behavior/Discharge Planning: Goal: Ability to identify and utilize available resources and services will improve Outcome: Progressing Goal: Ability to manage health-related needs will improve Outcome: Progressing   Problem: Metabolic: Goal: Ability to maintain appropriate glucose levels will improve Outcome: Progressing   Problem: Nutritional: Goal: Maintenance of adequate nutrition will improve Outcome: Progressing Goal: Progress toward achieving an optimal weight will improve Outcome: Progressing   Problem: Skin Integrity: Goal: Risk for impaired skin integrity will decrease Outcome: Progressing   Problem: Tissue Perfusion: Goal: Adequacy of tissue perfusion will improve Outcome: Progressing   Problem: Education: Goal: Knowledge of disease or condition will improve Outcome: Progressing Goal: Knowledge of secondary prevention will improve (MUST DOCUMENT ALL) Outcome: Progressing Goal: Knowledge of patient specific risk factors will improve Elta [place] N/A or DELETE if not current risk factor) Outcome: Progressing   Problem: Ischemic Stroke/TIA Tissue Perfusion: Goal: Complications of ischemic stroke/TIA will be minimized Outcome: Progressing   Problem: Coping: Goal: Will verbalize positive feelings about self Outcome: Progressing Goal: Will identify appropriate support needs Outcome: Progressing   Problem: Health Behavior/Discharge Planning: Goal: Ability to manage health-related needs will improve Outcome: Progressing Goal: Goals will be collaboratively established with patient/family Outcome: Progressing   Problem: Self-Care: Goal: Ability to participate in self-care as condition permits will improve Outcome: Progressing Goal: Verbalization of feelings and concerns over difficulty with self-care will improve Outcome: Progressing Goal: Ability to communicate needs accurately  will improve Outcome: Progressing   Problem: Nutrition: Goal: Risk of aspiration will decrease Outcome: Progressing Goal: Dietary intake will improve Outcome: Progressing

## 2022-05-07 DIAGNOSIS — I639 Cerebral infarction, unspecified: Secondary | ICD-10-CM | POA: Diagnosis not present

## 2022-05-07 LAB — GLUCOSE, CAPILLARY
Glucose-Capillary: 198 mg/dL — ABNORMAL HIGH (ref 70–99)
Glucose-Capillary: 201 mg/dL — ABNORMAL HIGH (ref 70–99)
Glucose-Capillary: 127 mg/dL — ABNORMAL HIGH (ref 70–99)

## 2022-05-07 MED ORDER — CLOPIDOGREL BISULFATE 75 MG PO TABS
75.0000 mg | ORAL_TABLET | Freq: Every day | ORAL | Status: DC
  Administered 2022-05-07: 75 mg via ORAL
  Filled 2022-05-07: qty 1

## 2022-05-07 MED ORDER — INFLUENZA VAC SPLIT QUAD 0.5 ML IM SUSY
0.5000 mL | PREFILLED_SYRINGE | INTRAMUSCULAR | Status: AC
  Administered 2022-05-07: 0.5 mL via INTRAMUSCULAR
  Filled 2022-05-07 (×2): qty 0.5

## 2022-05-07 MED ORDER — PNEUMOCOCCAL 20-VAL CONJ VACC 0.5 ML IM SUSY
0.5000 mL | PREFILLED_SYRINGE | INTRAMUSCULAR | Status: AC
  Administered 2022-05-07: 0.5 mL via INTRAMUSCULAR
  Filled 2022-05-07 (×2): qty 0.5

## 2022-05-07 NOTE — Progress Notes (Signed)
Just returned from MRI. Patient was unable to tolerate the entire procedure. Patient felt like he was choking during the last five minutes of the procedure. Patient stated he did not want to continue the procedure anymore and will refuse repeat MRI. MD notified.

## 2022-05-07 NOTE — Progress Notes (Signed)
OT Cancellation Note  Patient Details Name: Christopher Roberson MRN: [id number] DOB: 1975-12-29   Cancelled Treatment:    Reason Eval/Treat Not Completed: OT screened, no needs identified, will sign off. Patient reports back to baseline with hx of R UE weakness from prior CVA.  Pt reports having exercises and recently released from OT.  He is independent with ADLs, no question or concerns for ADL/IADL engagement.  No further OT Needs identified.  OT will sign off.   Jolaine Artist, OT Acute Rehabilitation Services Office [phone]   Christopher Roberson 05/07/2022, 10:51 AM

## 2022-05-07 NOTE — Evaluation (Signed)
Physical Therapy one time Evaluation Patient Details Name: Christopher Roberson MRN: [id number] DOB: Sep 24, 1975 Today's Date: 05/07/2022  History of Present Illness  Christopher Roberson is a 47 y.o. male admitted 1/12  who presented to [facility] with acute onset of dizziness with right sided weakness associated with 2 falls at home in the prior two hours. NIHSS was 4 in triage, with exam showing right sided sensory deficit with right sided weakness and drift. He endorsed an 8/10 throbbing headache at presentation. Code Stroke was called and Teleneurology was consulted. Presentation concerning for possible new acute stroke vs recrudescence of prior stroke symptoms. After extensive discussion regarding the risks/benefits and alternatives of TNK with patient, patient ultimately elected to receive thrombolysis (received at 11:47 PM). Of note, there was significant delay (around 20 minutes) in TNK administration due to refractory hypertension.PMHx of strokes, DM, HTN, morbid obesity, sleep apnea on CPAP and depression  Clinical Impression  Pt admitted with above diagnosis. Pt independent and able to walk wihtout A device. Scored well on DGI as well.  Plan is for home today.  Doesn't need folllow up as pt is back to baseline. Will sign off.    Recommendations for follow up therapy are one component of a multi-disciplinary discharge planning process, led by the attending physician.  Recommendations may be updated based on patient status, additional functional criteria and insurance authorization.  Follow Up Recommendations No PT follow up      Assistance Recommended at Discharge None  Patient can return home with the following       Equipment Recommendations None recommended by PT  Recommendations for Other Services       Functional Status Assessment Patient has not had a recent decline in their functional status     Precautions / Restrictions Precautions Precautions: None Restrictions Weight Bearing  Restrictions: No      Mobility  Bed Mobility Overal bed mobility: Independent                  Transfers Overall transfer level: Independent                      Ambulation/Gait Ambulation/Gait assistance: Independent Gait Distance (Feet): 500 Feet Assistive device: None Gait Pattern/deviations: Step-through pattern, Decreased stride length   Gait velocity interpretation: 1.31 - 2.62 ft/sec, indicative of limited community ambulator   General Gait Details: No issues with balance wiht challenges.  Pt with steady gait and uses good safety awareness.  Stairs         General stair comments: declined need to practice  Wheelchair Mobility    Modified Rankin (Stroke Patients Only) Modified Rankin (Stroke Patients Only) Pre-Morbid Rankin Score: No significant disability Modified Rankin: No significant disability     Balance Overall balance assessment: Independent                               Standardized Balance Assessment Standardized Balance Assessment : Dynamic Gait Index   Dynamic Gait Index Level Surface: Normal Change in Gait Speed: Normal Gait with Horizontal Head Turns: Normal Gait with Vertical Head Turns: Normal Gait and Pivot Turn: Normal Step Over Obstacle: Normal Step Around Obstacles: Normal Steps: Mild Impairment Total Score: 23       Pertinent Vitals/Pain Pain Assessment Pain Assessment: No/denies pain    Home Living Family/patient expects to be discharged to:: Private residence Living Arrangements: Spouse/significant other Available Help at Discharge: Family;Available PRN/intermittently Type of  Home: House Home Access: Level entry     Alternate Level Stairs-Number of Steps: 15 Home Layout: Multi-level;Able to live on main level with bedroom/bathroom Home Equipment: Rolling Walker (2 wheels);Cane - single point      Prior Function Prior Level of Function : Independent/Modified Independent              Mobility Comments: ambulates with RW vs cane depending on how he feels ADLs Comments: independent per pt     Hand Dominance   Dominant Hand: Right    Extremity/Trunk Assessment   Upper Extremity Assessment Upper Extremity Assessment: Overall WFL for tasks assessed;RUE deficits/detail RUE Deficits / Details: pt reports right shoulder weak aftetr prior CVA but he is at baseline    Lower Extremity Assessment Lower Extremity Assessment: Overall WFL for tasks assessed    Cervical / Trunk Assessment Cervical / Trunk Assessment: Normal  Communication   Communication: No difficulties  Cognition Arousal/Alertness: Awake/alert Behavior During Therapy: WFL for tasks assessed/performed Overall Cognitive Status: Within Functional Limits for tasks assessed                                          General Comments      Exercises     Assessment/Plan    PT Assessment Patient does not need any further PT services  PT Problem List         PT Treatment Interventions      PT Goals (Current goals can be found in the Care Plan section)  Acute Rehab PT Goals Patient Stated Goal: go home today PT Goal Formulation: All assessment and education complete, DC therapy    Frequency       Co-evaluation               AM-PAC PT "6 Clicks" Mobility  Outcome Measure Help needed turning from your back to your side while in a flat bed without using bedrails?: None Help needed moving from lying on your back to sitting on the side of a flat bed without using bedrails?: None Help needed moving to and from a bed to a chair (including a wheelchair)?: None Help needed standing up from a chair using your arms (e.g., wheelchair or bedside chair)?: None Help needed to walk in hospital room?: None Help needed climbing 3-5 steps with a railing? : None 6 Click Score: 24    End of Session   Activity Tolerance: Patient tolerated treatment well Patient left: in bed;with  call bell/phone within reach Nurse Communication: Mobility status PT Visit Diagnosis: Muscle weakness (generalized) (M62.81)    Time: 1040-1055 PT Time Calculation (min) (ACUTE ONLY): 15 min   Charges:   PT Evaluation $PT Eval Low Complexity: 1 Low          Berlie Persky M,PT Acute Rehab Services [phone]   Alvira Philips 05/07/2022, 11:14 AM

## 2022-05-07 NOTE — Discharge Summary (Addendum)
Stroke Discharge Summary  Patient ID: Christopher Roberson   MRN: [id number]      DOB: 09/30/1975  Date of Admission: 05/05/2022 Date of Discharge: 05/07/2022  Attending Physician:  Antony Contras MD Consultant(s):    None Patient's PCP:  [name], FNP  DISCHARGE DIAGNOSIS: left brain infarct aborted with TNK, likely small vessel disease given uncontrolled risk factors   Principal Problem:   Acute CVA (cerebrovascular accident) [facility]) Active Problems:   Stroke [facility]) Dizziness Right hemiparesis Diabetes Hypertension Obstructive sleep apnea      LABORATORY STUDIES CBC    Component Value Date/Time   WBC 7.4 05/05/2022 2320   RBC 4.26 05/05/2022 2320   HGB 12.5 (L) 05/05/2022 2320   HCT 37.5 (L) 05/05/2022 2320   PLT 284 05/05/2022 2320   MCV 88.0 05/05/2022 2320   MCH 29.3 05/05/2022 2320   MCHC 33.3 05/05/2022 2320   RDW 12.9 05/05/2022 2320   LYMPHSABS 2.2 05/05/2022 2320   MONOABS 0.6 05/05/2022 2320   EOSABS 0.2 05/05/2022 2320   BASOSABS 0.0 05/05/2022 2320   CMP    Component Value Date/Time   NA 140 05/05/2022 2320   K 3.2 (L) 05/05/2022 2320   CL 104 05/05/2022 2320   CO2 27 05/05/2022 2320   GLUCOSE 123 (H) 05/05/2022 2320   BUN 10 05/05/2022 2320   CREATININE 0.84 05/05/2022 2320   CALCIUM 9.5 05/05/2022 2320   PROT 7.6 05/05/2022 2320   ALBUMIN 4.3 05/05/2022 2320   AST 16 05/05/2022 2320   ALT 15 05/05/2022 2320   ALKPHOS 42 05/05/2022 2320   BILITOT 0.9 05/05/2022 2320   GFRNONAA >60 05/05/2022 2320   GFRAA >60 10/10/2018 0330   COAGS Lab Results  Component Value Date   INR 1.0 05/05/2022   INR 1.0 04/28/2021   Lipid Panel    Component Value Date/Time   CHOL 172 05/06/2022 0333   CHOL 183 06/17/2021 1259   TRIG 67 05/06/2022 0333   HDL 65 05/06/2022 0333   HDL 62 06/17/2021 1259   CHOLHDL 2.6 05/06/2022 0333   VLDL 13 05/06/2022 0333   LDLCALC 94 05/06/2022 0333   LDLCALC 103 (H) 06/17/2021 1259   HgbA1C  Lab Results   Component Value Date   HGBA1C 7.0 (H) 05/06/2022   Urinalysis    Component Value Date/Time   COLORURINE COLORLESS (A) 05/05/2022 2304   APPEARANCEUR CLEAR 05/05/2022 2304   LABSPEC 1.010 05/05/2022 2304   PHURINE 7.0 05/05/2022 2304   GLUCOSEU NEGATIVE 05/05/2022 2304   HGBUR NEGATIVE 05/05/2022 2304   BILIRUBINUR NEGATIVE 05/05/2022 2304   KETONESUR NEGATIVE 05/05/2022 2304   PROTEINUR NEGATIVE 05/05/2022 2304   UROBILINOGEN 1.0 01/05/2012 1232   NITRITE NEGATIVE 05/05/2022 2304   LEUKOCYTESUR NEGATIVE 05/05/2022 2304   Urine Drug Screen     Component Value Date/Time   LABOPIA NONE DETECTED 05/05/2022 2304   COCAINSCRNUR NONE DETECTED 05/05/2022 2304   LABBENZ NONE DETECTED 05/05/2022 2304   AMPHETMU NONE DETECTED 05/05/2022 2304   THCU NONE DETECTED 05/05/2022 2304   LABBARB NONE DETECTED 05/05/2022 2304    Alcohol Level    Component Value Date/Time   ETH <10 05/05/2022 2320     SIGNIFICANT DIAGNOSTIC STUDIES MR BRAIN WO CONTRAST  Result Date: 05/07/2022 CLINICAL DATA:  Follow-up examination for stroke. EXAM: MRI HEAD WITHOUT CONTRAST TECHNIQUE: Multiplanar, multiecho pulse sequences of the brain and surrounding structures were obtained without intravenous contrast. COMPARISON:  Prior CTs from 05/06/2022 as well  as earlier exams. FINDINGS: Brain: Examination technically limited as the patient was unable to tolerate the full length of the study. Diffusion-weighted sequences, axial T2/FLAIR sequences, and sagittal T1 weighted sequence only were performed. Cerebral volume stable. Scattered T2/FLAIR hyperintensity involving the supratentorial cerebral white matter, most characteristic of chronic microvascular ischemic disease, overall mild in nature. No evidence for acute or interval infarction. No residual diffusion signal abnormality now seen about the previously identified subacute small vessel infarct at the left frontal corona radiata. Gray-white matter differentiation  maintained. No areas of chronic cortical infarction. No mass lesion, midline shift or mass effect. No hydrocephalus or extra-axial fluid collection. Pituitary gland and suprasellar region within normal limits. Vascular: Major intracranial vascular flow voids are maintained. Skull and upper cervical spine: Craniocervical junction normal. Bone marrow signal intensity overall within normal limits. No focal marrow replacing lesion. No scalp soft tissue abnormality. Sinuses/Orbits: Prior bilateral ocular lens replacement. Scattered mucosal thickening present throughout the frontoethmoidal and maxillary sinuses. Trace right mastoid effusion, of doubtful significance. Other: None. IMPRESSION: 1. Technically limited exam due to the patient's inability to tolerate the full length of the study. 2. No acute intracranial abnormality. 3. Interval resolution of previously seen mild diffusion signal abnormality about a small subacute small vessel infarct involving the left frontal corona radiata. 4. Underlying mild cerebral white matter disease, most likely related chronic microvascular ischemic disease. Electronically Signed   By: Jeannine Boga M.D.   On: 05/07/2022 02:27   ECHOCARDIOGRAM COMPLETE  Result Date: 05/06/2022    ECHOCARDIOGRAM REPORT   Patient Name:   Christopher Roberson Date of Exam: 05/06/2022 Medical Rec #:  [id number]      Height:       68.0 in Accession #:    [id number]     Weight:       280.0 lb Date of Birth:  1975-07-19      BSA:          2.358 m Patient Age:    47 years       BP:           155/78 mmHg Patient Gender: M              HR:           79 bpm. Exam Location:  Inpatient Procedure: 2D Echo, Color Doppler and Cardiac Doppler Indications:    Stroke I63.9  History:        Patient has prior history of Echocardiogram examinations, most                 recent 04/29/2021. Stroke; Risk Factors:Sleep Apnea, Hypertension,                 Non-Smoker and Diabetes.  Sonographer:    Greer Pickerel Referring Phys:  [phone] ERIC LINDZEN  Sonographer Comments: Patient is obese. Image acquisition challenging due to respiratory motion. IMPRESSIONS  1. Left ventricular ejection fraction, by estimation, is 60 to 65%. The left ventricle has normal function. The left ventricle has no regional wall motion abnormalities. There is moderate concentric left ventricular hypertrophy. Left ventricular diastolic parameters were normal.  2. Right ventricular systolic function is normal. The right ventricular size is normal. There is normal pulmonary artery systolic pressure. The estimated right ventricular systolic pressure is 7.2 mmHg.  3. Left atrial size was mildly dilated.  4. The mitral valve is grossly normal. No evidence of mitral valve regurgitation. No evidence of mitral stenosis.  5. The aortic valve is tricuspid. Aortic  valve regurgitation is not visualized. No aortic stenosis is present.  6. Aortic dilatation noted. There is mild dilatation of the aortic root, measuring 41 mm.  7. The inferior vena cava is normal in size with greater than 50% respiratory variability, suggesting right atrial pressure of 3 mmHg. Conclusion(s)/Recommendation(s): No intracardiac source of embolism detected on this transthoracic study. Consider a transesophageal echocardiogram to exclude cardiac source of embolism if clinically indicated. FINDINGS  Left Ventricle: Left ventricular ejection fraction, by estimation, is 60 to 65%. The left ventricle has normal function. The left ventricle has no regional wall motion abnormalities. The left ventricular internal cavity size was normal in size. There is  moderate concentric left ventricular hypertrophy. Left ventricular diastolic parameters were normal. Right Ventricle: The right ventricular size is normal. No increase in right ventricular wall thickness. Right ventricular systolic function is normal. There is normal pulmonary artery systolic pressure. The tricuspid regurgitant velocity is 1.03 m/s, and  with an  assumed right atrial pressure of 3 mmHg, the estimated right ventricular systolic pressure is 7.2 mmHg. Left Atrium: Left atrial size was mildly dilated. Right Atrium: Right atrial size was normal in size. Pericardium: Trivial pericardial effusion is present. Presence of epicardial fat layer. Mitral Valve: The mitral valve is grossly normal. No evidence of mitral valve regurgitation. No evidence of mitral valve stenosis. Tricuspid Valve: The tricuspid valve is grossly normal. Tricuspid valve regurgitation is trivial. No evidence of tricuspid stenosis. Aortic Valve: The aortic valve is tricuspid. Aortic valve regurgitation is not visualized. No aortic stenosis is present. Pulmonic Valve: The pulmonic valve was grossly normal. Pulmonic valve regurgitation is not visualized. No evidence of pulmonic stenosis. Aorta: Aortic dilatation noted. There is mild dilatation of the aortic root, measuring 41 mm. Venous: The right lower pulmonary vein is normal. The inferior vena cava is normal in size with greater than 50% respiratory variability, suggesting right atrial pressure of 3 mmHg. IAS/Shunts: The atrial septum is grossly normal.  LEFT VENTRICLE PLAX 2D LVIDd:         5.40 cm   Diastology LVIDs:         3.20 cm   LV e' medial:    7.46 cm/s LV PW:         1.40 cm   LV E/e' medial:  12.2 LV IVS:        1.40 cm   LV e' lateral:   9.17 cm/s LVOT diam:     2.20 cm   LV E/e' lateral: 9.9 LV SV:         93 LV SV Index:   39 LVOT Area:     3.80 cm  RIGHT VENTRICLE RV S prime:     20.40 cm/s TAPSE (M-mode): 2.4 cm LEFT ATRIUM              Index        RIGHT ATRIUM           Index LA diam:        3.60 cm  1.53 cm/m   RA Area:     17.50 cm LA Vol (A2C):   108.0 ml 45.80 ml/m  RA Volume:   35.80 ml  15.18 ml/m LA Vol (A4C):   88.9 ml  37.70 ml/m LA Biplane Vol: 99.6 ml  42.24 ml/m  AORTIC VALVE LVOT Vmax:   121.00 cm/s LVOT Vmean:  80.800 cm/s LVOT VTI:    0.244 m  AORTA Ao Root diam: 4.10 cm Ao Asc diam:  3.50  cm MITRAL VALVE                TRICUSPID VALVE MV Area (PHT): 3.60 cm    TR Peak grad:   4.2 mmHg MV Decel Time: 211 msec    TR Vmax:        103.00 cm/s MV E velocity: 90.80 cm/s MV A velocity: 82.70 cm/s  SHUNTS MV E/A ratio:  1.10        Systemic VTI:  0.24 m                            Systemic Diam: 2.20 cm Eleonore Chiquito MD Electronically signed by Eleonore Chiquito MD Signature Date/Time: 05/06/2022/1:53:13 PM    Final    CT ANGIO HEAD NECK W WO CM W PERF (CODE STROKE)  Result Date: 05/06/2022 CLINICAL DATA:  Follow-up examination for stroke. EXAM: CT ANGIOGRAPHY HEAD AND NECK CT PERFUSION BRAIN TECHNIQUE: Multidetector CT imaging of the head and neck was performed using the standard protocol during bolus administration of intravenous contrast. Multiplanar CT image reconstructions and MIPs were obtained to evaluate the vascular anatomy. Carotid stenosis measurements (when applicable) are obtained utilizing NASCET criteria, using the distal internal carotid diameter as the denominator. Multiphase CT imaging of the brain was performed following IV bolus contrast injection. Subsequent parametric perfusion maps were calculated using RAPID software. RADIATION DOSE REDUCTION: This exam was performed according to the departmental dose-optimization program which includes automated exposure control, adjustment of the mA and/or kV according to patient size and/or use of iterative reconstruction technique. CONTRAST:  132m OMNIPAQUE IOHEXOL 350 MG/ML SOLN COMPARISON:  Prior CT from 05/05/2022. FINDINGS: CTA NECK FINDINGS Aortic arch: Visualized aortic arch normal in caliber standard branch pattern. No stenosis about the origin the great vessels. Right carotid system: Right common and internal carotid arteries are patent without dissection. Mild atheromatous change about the right carotid bulb without hemodynamically significant greater than 50% stenosis. Left carotid system: Left common and internal carotid arteries are patent without  dissection. Mild atheromatous change about the left carotid bulb without hemodynamically significant greater than 50% stenosis. Vertebral arteries: Both vertebral arteries arise from the subclavian arteries. No proximal subclavian artery stenosis. Both vertebral arteries widely patent without stenosis, dissection or occlusion. Skeleton: No discrete or worrisome osseous lesions. Os odontoideum noted. Other neck: No other acute soft tissue abnormality within the neck. Upper chest: Visualized upper chest demonstrates no acute finding. Review of the MIP images confirms the above findings CTA HEAD FINDINGS Anterior circulation: Evaluation of the intracranial circulation somewhat limited by timing the contrast bolus. Both internal carotid arteries patent to the termini without stenosis. A1 segments, anterior communicating artery complex common anterior cerebral arteries patent without visible stenosis. No M1 stenosis or occlusion. No proximal MCA branch occlusion or high-grade stenosis. Distal MCA branches perfused and symmetric. Posterior circulation: Both V4 segments patent without stenosis. Neither PICA well visualized. Basilar patent without stenosis. Superior cerebellar and posterior cerebral arteries patent bilaterally. Venous sinuses: Patent allowing for timing the contrast bolus. Anatomic variants: None significant.  No aneurysm. Review of the MIP images confirms the above findings CT Brain Perfusion Findings: ASPECTS: 10 CBF (<30%) Volume: 099mPerfusion (Tmax>6.0s) volume: 1453mismatch Volume: 69m34mfarction Location:Negative CT perfusion for acute ischemia. Apparent multifocal areas of delayed perfusion involving the bilateral cerebral hemispheres, measuring up to 14 mL in volume, favored to be artifactual. IMPRESSION: 1. Negative CTA for large vessel occlusion or other emergent finding. 2.  Negative CT perfusion for acute ischemia. 14 mL of apparent delayed perfusion, favored to be artifactual. No definite  perfusion abnormality. 3. Mild atheromatous change about the carotid bifurcations without hemodynamically significant stenosis. Electronically Signed   By: Jeannine Boga M.D.   On: 05/06/2022 00:33   CT HEAD CODE STROKE WO CONTRAST  Result Date: 05/05/2022 CLINICAL DATA:  Code stroke. Initial evaluation for neuro deficit, stroke suspected. Dizziness with right-sided weakness. EXAM: CT HEAD WITHOUT CONTRAST TECHNIQUE: Contiguous axial images were obtained from the base of the skull through the vertex without intravenous contrast. RADIATION DOSE REDUCTION: This exam was performed according to the departmental dose-optimization program which includes automated exposure control, adjustment of the mA and/or kV according to patient size and/or use of iterative reconstruction technique. COMPARISON:  Comparison made with prior exams from 04/29/2021 and 04/28/2021. FINDINGS: Brain: Cerebral volume within normal limits for patient age. No evidence for acute intracranial hemorrhage. No findings to suggest acute large vessel territory infarct. No mass lesion, midline shift, or mass effect. Ventricles are normal in size without evidence for hydrocephalus. No extra-axial fluid collection identified. Vascular: No hyperdense vessel identified. Skull: Scalp soft tissues demonstrate no acute abnormality. Calvarium intact. Sinuses/Orbits: Globes and orbital soft tissues within normal limits. Visualized paranasal sinuses are clear. No mastoid effusion. ASPECTS [facility] Stroke Program Early CT Score) - Ganglionic level infarction (caudate, lentiform nuclei, internal capsule, insula, M1-M3 cortex): 7 - Supraganglionic infarction (M4-M6 cortex): 3 Total score (0-10 with 10 being normal): 10 IMPRESSION: 1. Negative head CT.  No acute intracranial abnormality. 2. ASPECTS is 10. Results were called by telephone at the time of interpretation on 05/05/2022 at 11:26 pm to provider HAYLEY NAASZ , who verbally acknowledged these  results. Electronically Signed   By: Jeannine Boga M.D.   On: 05/05/2022 23:27      HISTORY OF PRESENT ILLNESS  Christopher Roberson is a 47 y.o. male with a PMHx of strokes, DM, HTN, morbid obesity, sleep apnea on CPAP and depression who presented to [facility] Thursday night with acute onset of dizziness with right sided weakness associated with 2 falls at home in the prior two hours. NIHSS was 4 in triage, with exam showing right sided sensory deficit with right sided weakness and drift. He endorsed an 8/10 throbbing headache at presentation. He was given TNK and transferred to [facility] for further care. MRI negative for acute abnormality.  Flu and PNA vaccines given at discharge  HOSPITAL COURSE Stroke:  suspect left brain infarct aborted with TNK, likely small vessel disease given uncontrolled risk factors  Code Stroke CT head No acute abnormality. ASPECTS 10.    CTA head & neck No LVO MRI  No acute abnormality 2D Echo EF 60 to 65% LDL 94 HgbA1c 7.0 UDS negative VTE prophylaxis - SCDs clopidogrel 75 mg daily prior to admission, now on No antithrombotic. No antithrombotic until 24 hours post TNK Therapy recommendations:  Pending Disposition:  Pending   History of stroke 04/2021 admitted for right-sided numbness.  CT no acute abnormality.  CTA head and neck unremarkable.  MRI showed left CR small infarct.  EF 50 to 55%, LDL 115, A1c 10.8.  Discharged on DAPT and Lipitor.   Hypertension Home meds:  Norvasc, Cozaar, Toprol-XL, olmesartan, lisinopril? Resume home medications to wean off cleviprex Now on spironolactone 25, Cozaar 100, HCTZ 25, Coreg 25 twice daily, amlodipine 5. Taper off Cleviprex as able SBP less than 180/105 Long-term BP goal normotensive   Hyperlipidemia Home meds:  Atorvastatin '10mg'$ , resumed  in hospital LDL 94, goal < 70 Increased to Atorvastatin '40mg'$  Continue statin at discharge   Diabetes type II Uncontrolled Home meds:  Semaglutide, farxiga, metformin HgbA1c 7.0,  goal < 7.0 CBGs SSI Diabetic coordinator on board   Other Stroke Risk Factors Obesity, Body mass index is 42.57 kg/m., BMI >/= 30 associated with increased stroke risk, recommend weight loss, diet and exercise as appropriate  Obstructive sleep apnea, on CPAP at home    DISCHARGE EXAM Blood pressure 136/80, pulse 61, temperature 97.9 F (36.6 C), temperature source Oral, resp. rate 13, height '5\' 8"'$  (1.727 m), weight 127 kg, SpO2 99 %.  PHYSICAL EXAM HEENT-  [place]/AT  Lungs - Respirations unlabored Extremities - Warm and well perfused   Neurologic Examination: Mental Status: Alert, oriented to person, place, time, location, and situation. Thought content appropriate.  Speech fluent without evidence of aphasia. No dysarthria. Able to follow all commands without difficulty. Cranial Nerves: II: Visual fields intact with no extinction, PERRL III,IV, VI: No ptosis. EOMI.  V: Temp sensation equal bilaterally  VII: Subtle right facial droop.  VIII: Hearing intact to voice IX,X: No hoarseness XI: Symmetric shoulder shrug XII: Midline tongue extension Motor: exam limited due to echo taking place RUE 4+/5- baseline LUE 5/5 RLE 4+/5- baseline LLE 5/5 Sensory: Temp and light touch intact throughout, bilaterally. No extinction to DSS.  Deep Tendon Reflexes: Hypoactive reflexes in the context of prominent muscle bulk and adiposity.  Cerebellar: No ataxia with FNF bilaterally  Gait: Deferred  Discharge Diet       Diet   Diet Heart Room service appropriate? Yes with Assist; Fluid consistency: Thin   liquids  DISCHARGE PLAN Disposition:  Home Continue Plavix '75mg'$  Ongoing stroke risk factor control by Primary Care Physician at time of discharge Follow-up PCP [name], FNP in 2 weeks. Follow-up in [facility] in 8 weeks, office to schedule an appointment.   34 minutes were spent preparing discharge.  Patient seen and examined by NP/APP  with MD. MD to update note as needed.   Janine Ores, DNP, FNP-BC [facility] Pager: [phone]  I have personally obtained history,examined this patient, reviewed notes, independently viewed imaging studies, participated in medical decision making and plan of care.ROS completed by me personally and pertinent positives fully documented  I have made any additions or clarifications directly to the above note. Agree with note above.    Antony Contras, MD Medical Director [facility] Pager: [phone] 05/07/2022 12:59 PM

## 2022-05-07 NOTE — Plan of Care (Signed)
Patient was able to ambulate from bed to bathroom and wipe himself for bath with minimal assistance.

## 2022-05-07 NOTE — Progress Notes (Signed)
Pt being discharged. Pt educated on discharge instructions and all questions answered. All belongings sent home with patient. Home via car with wife.

## 2022-05-18 ENCOUNTER — Telehealth: Payer: Self-pay

## 2022-05-18 NOTE — Patient Outreach (Signed)
  Emmi Stroke Care Coordination Follow Up  05/18/2022 Name:  Christopher Roberson MRN:  [id number] DOB:  [birth year]  Subjective: [name] is a 47 y.o. year old male who is a primary care patient of [name], FNP    An Emmi alert was received on 05/17/22 indicating patient responded to questions: Lost interest in things they used to enjoy?.    I reached out by phone to follow up on the alert.  Care Coordination Interventions:  No, not indicated   Follow up plan:  RN CM will make outreach attempt to patient again if no return call.    Encounter Outcome:  No Answer   Enzo Montgomery, RN,BSN,CCM [place] [facility] Telephonic Care Management Coordinator Direct Phone: [phone] Toll Free: [phone] Fax: [phone]

## 2022-05-18 NOTE — Patient Outreach (Signed)
  [facility] Stroke Care Coordination Follow Up  05/18/2022 Name:  Jerron Niblack MRN:  [id number] DOB:  21-Aug-1975  Subjective: Jeptha Hinnenkamp is a 47 y.o. year old male who is a primary care patient of [name], FNP   An Emmi alert was received indicating patient responded to questions: Lost interest in things they used to enjoy?.  Incoming call from patient returning RN CM call. He voices that tings are going well. He has returned to his pre-hospital level of functioning. He voices that his appetite is good. Blood sugars have been better controlled. States his highest reading was 163. Patient confirms he has PCP(Dr. [place]) appt on 06/01/22. He goes to see neuro on 06/30/22. Denies any issues with transportation. He confirms he has all his meds and no issues or concerns regarding them. Reviewed and addressed red alert. Patient reports error in response. Denies any RN CM needs or concerns at this time.   Care Coordination Interventions:  Yes, provided   Follow up plan: No further intervention required. Advised patient that they would continue to get automated EMMI-Stroke post discharge calls to assess how they are doing following recent hospitalization and will receive a call from a nurse if any of their responses were abnormal. Patient voiced understanding and was appreciative of f/u call.  Encounter Outcome:  Pt. Visit Completed   Enzo Montgomery, RN,BSN,CCM [place] [facility] Telephonic Care Management Coordinator Direct Phone: [phone] Toll Free: [phone] Fax: [phone]

## 2022-05-18 NOTE — Patient Outreach (Signed)
Received a red flag Emmi stroke notification for [name]. I have assigned Enzo Montgomery, RN to call for follow up and determine if there are any Case Management needs.    [facility], [place], [place] [facility] [phone]

## 2022-06-22 ENCOUNTER — Telehealth: Payer: Self-pay | Admitting: Neurology

## 2022-06-22 NOTE — Telephone Encounter (Signed)
N\ote about appt

## 2022-06-28 ENCOUNTER — Ambulatory Visit: Payer: Medicare Other | Admitting: Neurology

## 2022-06-30 ENCOUNTER — Inpatient Hospital Stay: Payer: Medicare Other | Admitting: Neurology

## 2022-06-30 ENCOUNTER — Encounter: Payer: Self-pay | Admitting: Neurology

## 2022-07-05 ENCOUNTER — Ambulatory Visit: Payer: Medicare HMO | Admitting: Podiatry

## 2022-07-06 ENCOUNTER — Encounter: Payer: Medicare HMO | Admitting: Urology

## 2022-07-06 ENCOUNTER — Encounter: Payer: Self-pay | Admitting: Podiatry

## 2022-07-06 ENCOUNTER — Encounter: Payer: Self-pay | Admitting: Urology

## 2022-07-06 NOTE — Progress Notes (Deleted)
Assessment: No diagnosis found.   Plan: I personally reviewed the patient's chart including provider notes, ***. Today I had a long discussion with the patient spending a total of *** minutes discussing ED.  I discussed the pathophysiology, etiology, and natural history of ED as well as management options using a goal-oriented approach.  We discussed the following options: Medical therapy, vacuum erection device, penile injections, intraurethral suppository therapy (MUSE), and penile prosthesis. Patient educational materials concerning these options were given to the patient.  {plan; [id number]   Chief Complaint: No chief complaint on file.   History of Present Illness:  Christopher Roberson is a 47 y.o. male who is seen in consultation from [name], FNP for evaluation of erectile dysfunction.  He has previously seen Dr. Butch Penny in [facility] for evaluation of gross hematuria.  He underwent hematuria evaluation in November 2019.  CT imaging showed no acute process within the abdomen or pelvis, no renal or ureteral calculi and no evidence of obstruction.  Cystoscopy showed no urethral or bladder abnormalities.  Urine cytology was negative for malignancy.  His urine cytology did show renal tubular cells suggestive of renal tubular injury.  Past Medical History:  Past Medical History:  Diagnosis Date   Depression    Diabetes mellitus    Hypertension    Obesity    Sleep apnea    On CPAP machine   Stroke [facility])     Past Surgical History:  Past Surgical History:  Procedure Laterality Date   APPENDECTOMY     CARPAL TUNNEL RELEASE     ESOPHAGOGASTRODUODENOSCOPY     [facility] around 2015-2016   HERNIA REPAIR      Allergies:  Allergies  Allergen Reactions   Percocet [Oxycodone-Acetaminophen] Itching    Can take if takes with Benadryl    Family History:  Family History  Problem Relation Age of Onset   Heart disease Mother    Colon cancer Father     Colon cancer Paternal Uncle    Prostate cancer Paternal Uncle    Prostate cancer Paternal Uncle    Cancer Paternal Grandmother    Prostate cancer Paternal Grandfather    Esophageal cancer Neg Hx    Rectal cancer Neg Hx    Stomach cancer Neg Hx     Social History:  Social History   Tobacco Use   Smoking status: Never   Smokeless tobacco: Never  Vaping Use   Vaping Use: Never used  Substance Use Topics   Alcohol use: No   Drug use: Never    Review of symptoms:  Constitutional:  Negative for unexplained weight loss, night sweats, fever, chills ENT:  Negative for nose bleeds, sinus pain, painful swallowing CV:  Negative for chest pain, shortness of breath, exercise intolerance, palpitations, loss of consciousness Resp:  Negative for cough, wheezing, shortness of breath GI:  Negative for nausea, vomiting, diarrhea, bloody stools GU:  Positives noted in HPI; otherwise negative for gross hematuria, dysuria, urinary incontinence Neuro:  Negative for seizures, poor balance, limb weakness, slurred speech Psych:  Negative for lack of energy, depression, anxiety Endocrine:  Negative for polydipsia, polyuria, symptoms of hypoglycemia (dizziness, hunger, sweating) Hematologic:  Negative for anemia, purpura, petechia, prolonged or excessive bleeding, use of anticoagulants  Allergic:  Negative for difficulty breathing or choking as a result of exposure to anything; no shellfish allergy; no allergic response (rash/itch) to materials, foods  Physical exam: There were no vitals taken for this visit. GENERAL APPEARANCE:  Well appearing, well developed, well nourished, NAD HEENT: Atraumatic, Normocephalic, oropharynx clear. NECK: Supple without lymphadenopathy or thyromegaly. LUNGS: Clear to auscultation bilaterally. HEART: Regular Rate and Rhythm without murmurs, gallops, or rubs. ABDOMEN: Soft, non-tender, No Masses. EXTREMITIES: Moves all extremities well.  Without clubbing, cyanosis, or  edema. NEUROLOGIC:  Alert and oriented x 3, normal gait, CN II-XII grossly intact.  MENTAL STATUS:  Appropriate. BACK:  Non-tender to palpation.  No CVAT SKIN:  Warm, dry and intact.    Results: No results found for this or any previous visit (from the past 24 hour(s)).

## 2022-07-07 ENCOUNTER — Ambulatory Visit: Payer: Medicare HMO | Admitting: Orthopedic Surgery

## 2022-08-08 ENCOUNTER — Other Ambulatory Visit: Payer: Self-pay

## 2022-08-08 NOTE — Patient Outreach (Signed)
Telephone outreach to patient to obtain mRS was successfully completed. MRS= 1  Christopher Roberson [facility] Care Management Assistant [phone] 

## 2022-09-29 ENCOUNTER — Ambulatory Visit (INDEPENDENT_AMBULATORY_CARE_PROVIDER_SITE_OTHER): Payer: Medicare HMO | Admitting: Orthopaedic Surgery

## 2022-09-29 ENCOUNTER — Other Ambulatory Visit (INDEPENDENT_AMBULATORY_CARE_PROVIDER_SITE_OTHER): Payer: Medicare HMO

## 2022-09-29 VITALS — Ht 68.0 in | Wt 285.0 lb

## 2022-09-29 DIAGNOSIS — M25561 Pain in right knee: Secondary | ICD-10-CM

## 2022-09-29 DIAGNOSIS — G8929 Other chronic pain: Secondary | ICD-10-CM

## 2022-09-29 MED ORDER — BUPIVACAINE HCL 0.5 % IJ SOLN
2.0000 mL | INTRAMUSCULAR | Status: AC | PRN
  Administered 2022-09-29: 2 mL via INTRA_ARTICULAR

## 2022-09-29 MED ORDER — LIDOCAINE HCL 1 % IJ SOLN
2.0000 mL | INTRAMUSCULAR | Status: AC | PRN
  Administered 2022-09-29: 2 mL

## 2022-09-29 MED ORDER — METHYLPREDNISOLONE ACETATE 40 MG/ML IJ SUSP
40.0000 mg | INTRAMUSCULAR | Status: AC | PRN
  Administered 2022-09-29: 40 mg via INTRA_ARTICULAR

## 2022-09-29 NOTE — Progress Notes (Signed)
Office Visit Note   Patient: Christopher Roberson           Date of Birth: [birth year]           MRN: [id number] Visit Date: 09/29/2022              Requested by: Greer Ee., FNP No address on file PCP: Greer Ee., FNP   Assessment & Plan: Visit Diagnoses:  1. Chronic pain of right knee     Plan: Impression is 47 year old gentleman with right knee pain for 2 months etiology likely osteoarthritis with possible medial meniscal tear.  Physical therapy has not provided much relief.  I have recommended trying a cortisone injection which he agreed to and tolerated well.  He will follow-up with [place] if symptoms do not improve in 6 weeks.  Follow-Up Instructions: No follow-ups on file.   Orders:  Orders Placed This Encounter  Procedures   XR KNEE 3 VIEW RIGHT   No orders of the defined types were placed in this encounter.     Procedures: Large Joint Inj: R knee on 09/29/2022 4:31 PM Indications: pain Details: 22 G needle  Arthrogram: No  Medications: 40 mg methylPREDNISolone acetate 40 MG/ML; 2 mL lidocaine 1 %; 2 mL bupivacaine 0.5 % Consent was given by the patient. Patient was prepped and draped in the usual sterile fashion.       Clinical Data: No additional findings.   Subjective: Chief Complaint  Patient presents with   Right Knee - Pain    HPI Christopher Roberson is a 47 year old gentleman here for evaluation of right knee pain for 2 months.  He originally saw Dr. Bennett Scrape at [facility] for this and was initially prescribed physical therapy.  However he is continue to have pain as well as swelling and giving way.  He states he has pain that changes with the weather.  He has medial sided knee pain which increases at night. Review of Systems  Constitutional: Negative.   HENT: Negative.    Eyes: Negative.   Respiratory: Negative.    Cardiovascular: Negative.   Gastrointestinal: Negative.   Endocrine: Negative.   Genitourinary: Negative.   Skin: Negative.    Allergic/Immunologic: Negative.   Neurological: Negative.   Hematological: Negative.   Psychiatric/Behavioral: Negative.    All other systems reviewed and are negative.    Objective: Vital Signs: Ht 5\' 8"  (1.727 m)   Wt 285 lb (129.3 kg)   BMI 43.33 kg/m   Physical Exam Vitals and nursing note reviewed.  Constitutional:      Appearance: He is well-developed.  HENT:     Head: Normocephalic and atraumatic.  Eyes:     Pupils: Pupils are equal, round, and reactive to light.  Pulmonary:     Effort: Pulmonary effort is normal.  Abdominal:     Palpations: Abdomen is soft.  Musculoskeletal:        General: Normal range of motion.     Cervical back: Neck supple.  Skin:    General: Skin is warm.  Neurological:     Mental Status: He is alert and oriented to person, place, and time.  Psychiatric:        Behavior: Behavior normal.        Thought Content: Thought content normal.        Judgment: Judgment normal.     Ortho Exam Examination of the right knee shows medial joint line tenderness.  Pain with McMurray testing.  Collaterals and cruciates are  stable.  No joint effusion. Specialty Comments:  No specialty comments available.  Imaging: XR KNEE 3 VIEW RIGHT  Result Date: 09/29/2022 X-rays demonstrate mild osteoarthritis with periarticular spurring.    PMFS History: Patient Active Problem List   Diagnosis Date Noted   Stroke (HCC) 05/06/2022   Weakness of right upper extremity 05/22/2021   HLD (hyperlipidemia) 04/29/2021   Acute CVA (cerebrovascular accident) (HCC) 04/29/2021   Compression of right ulnar nerve at multiple levels 07/30/2020   Paresthesias 05/22/2020   Carpal tunnel syndrome of right wrist 02/26/2020   Hypomagnesemia 03/04/2019   Ulnar neuropathy of right upper extremity 03/04/2019   Ulnar nerve compression, left 02/11/2019   Status post placement of implantable loop recorder 12/07/2018   Degenerative cervical disc 11/04/2018   COVID-19 virus  infection    Suspected COVID-19 virus infection 10/09/2018   Viral gastroenteritis 10/09/2018   Acute respiratory failure with hypoxia (HCC) 10/08/2018   Medication management 08/27/2018   Cerebellar cerebrovascular accident (CVA) without late effect 05/09/2018   Bell's palsy 05/09/2018   Current use of insulin (HCC) 03/19/2018   Dry eye syndrome of both lacrimal glands 03/19/2018   Vitreous floaters of both eyes 03/19/2018   Premature ejaculation, acquired, generalized, moderate 02/26/2018   Gross hematuria 02/19/2018   Moderate episode of recurrent major depressive disorder (HCC) 02/06/2018   Grief 02/05/2018   SI (sacroiliac) pain 11/30/2017   GAD (generalized anxiety disorder) 07/20/2017   Severe nonproliferative diabetic retinopathy of both eyes without macular edema associated with type 2 diabetes mellitus (HCC) 07/20/2017   Thoracic radiculopathy 02/21/2017   Chronic right-sided low back pain with right-sided sciatica 02/16/2017   Alcohol abuse 11/01/2016   Intentional drug overdose (HCC) 10/31/2016   Severe episode of recurrent major depressive disorder, without psychotic features (HCC) 10/31/2016   Chronic pain of right knee 03/24/2016   Palpitations 08/17/2015   Vitamin D deficiency 06/24/2015   Adult situational stress disorder 06/10/2015   Recurrent depression (HCC) 06/10/2015   Umbilical hernia 06/10/2015   Acute pancreatitis 06/02/2015   Personality disorder (HCC) 04/03/2015   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 04/01/2015   Dyspnea on exertion 02/26/2015   Sleep apnea 02/26/2015   Type 2 diabetes mellitus with hyperglycemia, with long-term current use of insulin (HCC) 02/09/2015   HTN (hypertension) 02/09/2015   Adult BMI 45.0-49.9 kg/sq m (HCC) 02/09/2015   Morbid obesity (HCC) 02/09/2015   Chest pain 02/06/2015   Past Medical History:  Diagnosis Date   Depression    Diabetes mellitus    Hypertension    Obesity    Sleep apnea     On CPAP machine   Stroke (HCC)     Family History  Problem Relation Age of Onset   Heart disease Mother    Colon cancer Father    Colon cancer Paternal Uncle    Prostate cancer Paternal Uncle    Prostate cancer Paternal Uncle    Cancer Paternal Grandmother    Prostate cancer Paternal Grandfather    Esophageal cancer Neg Hx    Rectal cancer Neg Hx    Stomach cancer Neg Hx     Past Surgical History:  Procedure Laterality Date   APPENDECTOMY     CARPAL TUNNEL RELEASE     ESOPHAGOGASTRODUODENOSCOPY     [facility] around 2015-2016   HERNIA REPAIR     Social History   Occupational History   Not on file  Tobacco Use   Smoking status: Never  Smokeless tobacco: Never  Vaping Use   Vaping Use: Never used  Substance and Sexual Activity   Alcohol use: No   Drug use: Never   Sexual activity: Not on file

## 2022-10-31 ENCOUNTER — Ambulatory Visit: Payer: Medicare Other | Admitting: Neurology

## 2023-03-28 IMAGING — MR MR HEAD W/O CM
12 of 17 series · 34 of 48 positions shown · non-contrast
Comparison: CT head and CTA head and neck yesterday.

CLINICAL DATA: 45-year-old male with recent right upper extremity
weakness, chest pain.

EXAM:
MRI HEAD WITHOUT CONTRAST
TECHNIQUE: Multiplanar, multiecho pulse sequences of the brain and surrounding
structures were obtained without intravenous contrast.

[Series 5: DWI · axial · 3.0mm · 0.92mm/px · z∈[-109,+55]mm · 6 of 111 slices shown (1 of 4)]
[im 1/111]
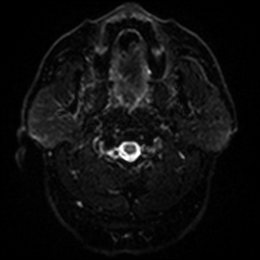
[im 23/111]
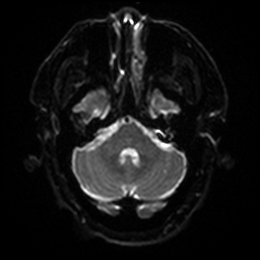
[im 45/111]
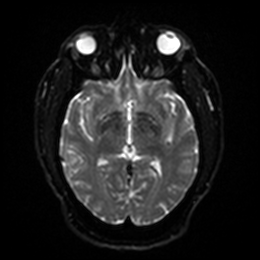
[im 67/111]
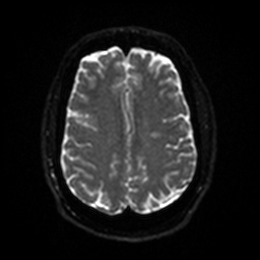
[im 89/111]
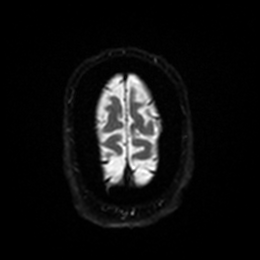
[im 111/111]
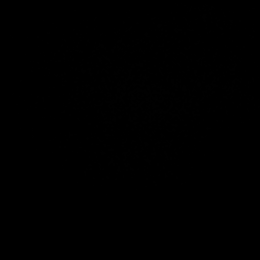

[Series 6: DWI · axial · 3.0mm · 0.92mm/px · z∈[-109,+49]mm · 2 of 54 slices shown (2 of 4)]
[im 1/54]
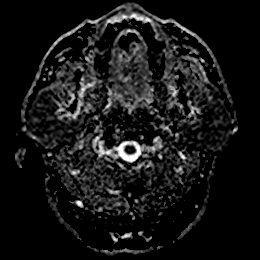
[im 54/54]
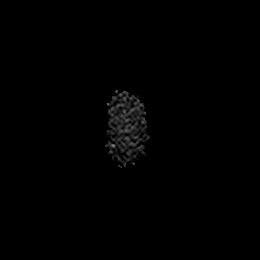

[Series 7: DWI · coronal · 4.0mm · 0.92mm/px · 3 of 64 slices shown (3 of 4)]
[im 1/64]
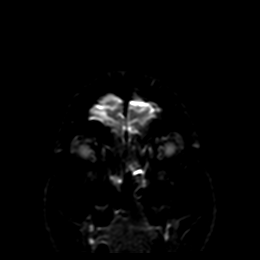
[im 32/64]
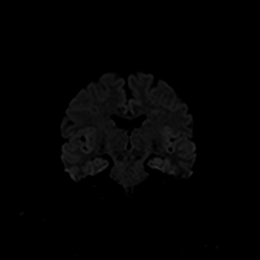
[im 64/64]
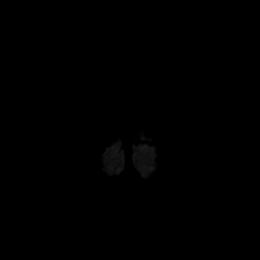

[Series 8: DWI · coronal · 4.0mm · 0.92mm/px · 2 of 32 slices shown (4 of 4)]
[im 1/32]
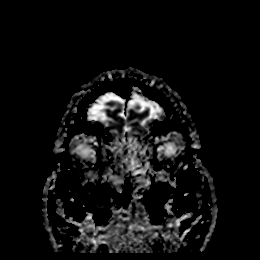
[im 32/32]
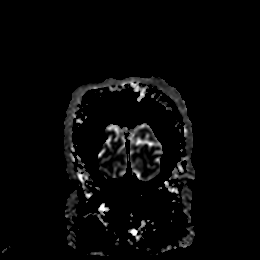

[Series 9: T1 · sagittal · 5.0mm · 0.75mm/px · 1 of 25 slices shown]
[im 1/25]
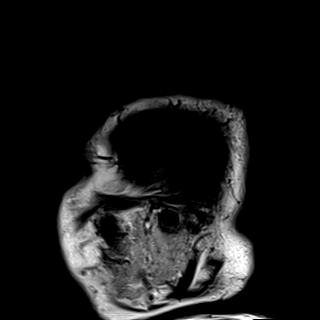

[Series 10: T2 · axial · 5.0mm · 0.72mm/px · z∈[-111,+62]mm · 2 of 30 slices shown (1 of 2)]
[im 1/30]
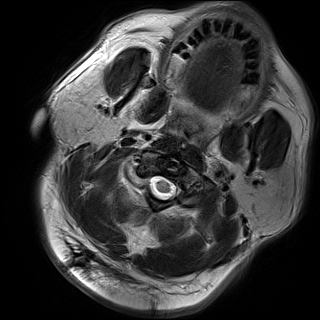
[im 30/30]
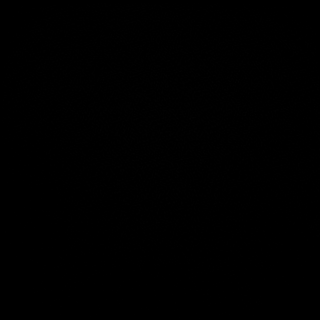

[Series 11: FLAIR · axial · 5.0mm · 0.45mm/px · z∈[-113,+60]mm · 2 of 30 slices shown]
[im 1/30]
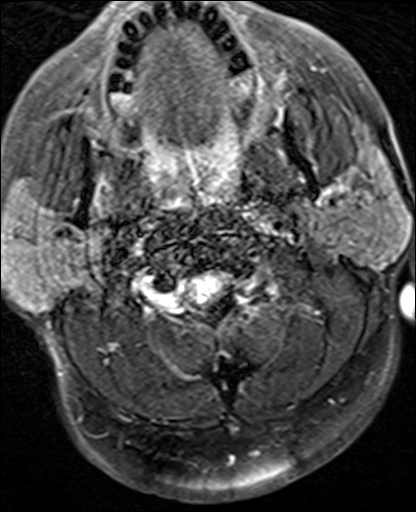
[im 30/30]
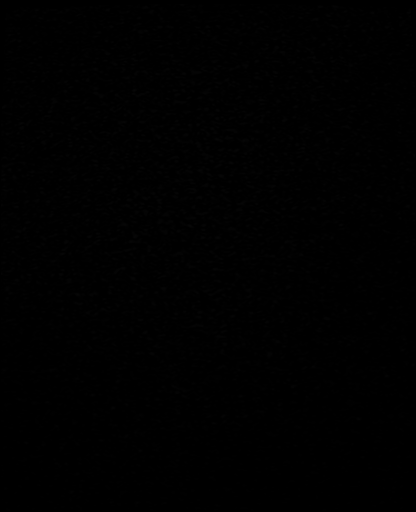

[Series 12: mag_images · axial · 3.0mm · 0.94mm/px · z∈[-109,+68]mm · 4 of 60 slices shown]
[im 1/60]
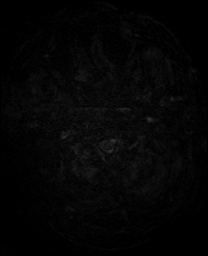
[im 20/60]
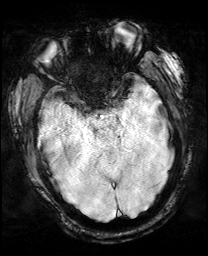
[im 40/60]
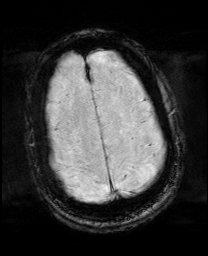
[im 60/60]
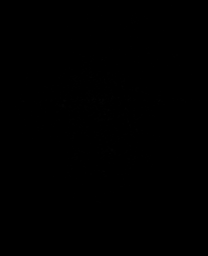

[Series 13: pha_images · axial · 3.0mm · 0.94mm/px · z∈[-109,+65]mm · 3 of 58 slices shown]
[im 1/58]
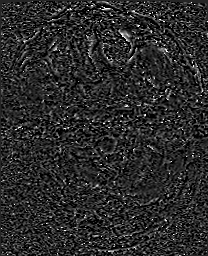
[im 29/58]
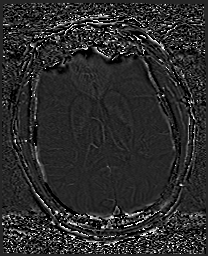
[im 58/58]
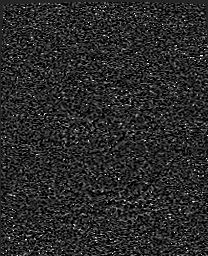

[Series 14: swi_images · axial · 3.0mm · 0.94mm/px · z∈[-109,+68]mm · 4 of 60 slices shown]
[im 1/60]
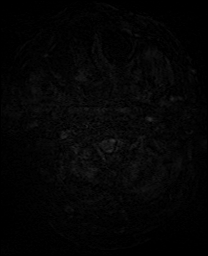
[im 20/60]
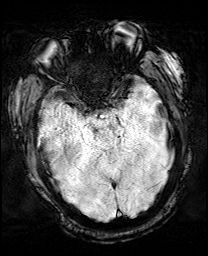
[im 40/60]
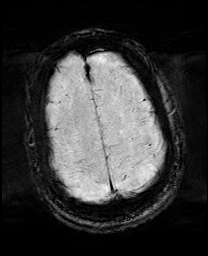
[im 60/60]
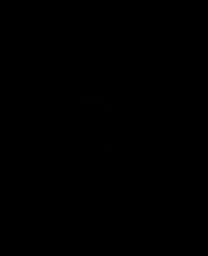

[Series 15: mip_images(sw) · axial · 24.0mm · 0.94mm/px · z∈[-98,+57]mm · 3 of 53 slices shown]
[im 1/53]
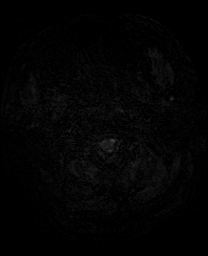
[im 27/53]
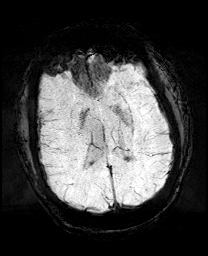
[im 53/53]
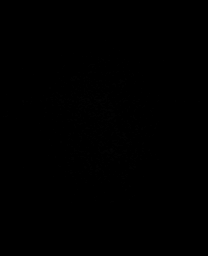

[Series 17: T2 · coronal · 5.0mm · 0.34mm/px · 2 of 30 slices shown (2 of 2)]
[im 1/30]
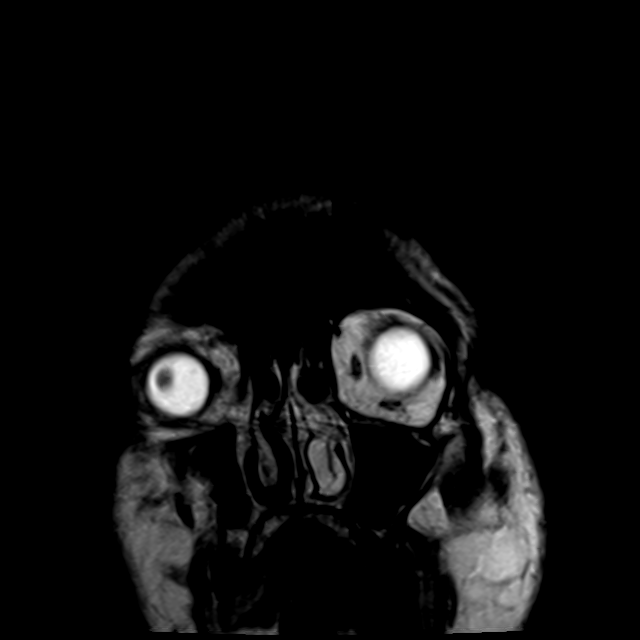
[im 30/30]
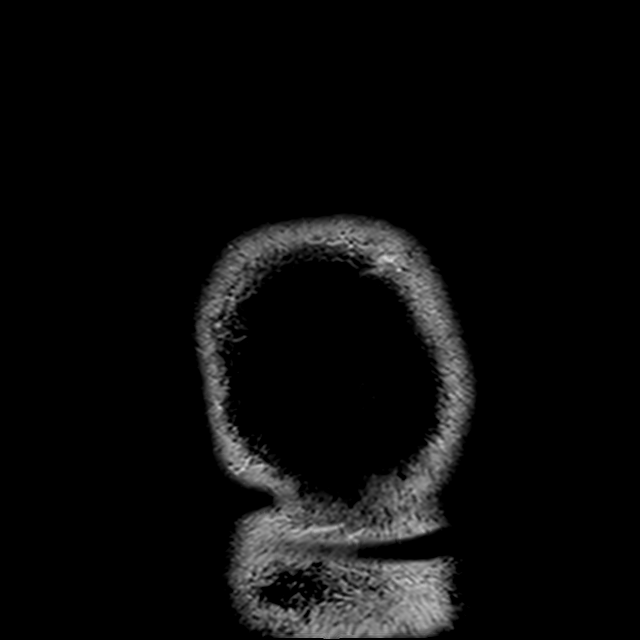

[34 of 48 positions shown; findings below may reference images not displayed]

Cervical spine MRI today reported separately.

Brain and cervical spine MRI 07/30/2020.
FINDINGS: Brain: Subtle linear abnormal diffusion in the left corona radiata
on series 5, image 90 is associated with T2 and FLAIR
hyperintensity, but isointense on ADC. This is new from last year.

No other abnormal diffusion. And elsewhere only minimal nonspecific
cerebral white matter T2 and FLAIR hyperintensity. No midline shift,
mass effect, evidence of mass lesion, ventriculomegaly, extra-axial
collection or acute intracranial hemorrhage. Cervicomedullary
junction and pituitary are within normal limits. No cortical
encephalomalacia or chronic cerebral blood products.

Cerebral volume is stable since last year and within normal limits.

Vascular: Major intracranial vascular flow voids are stable.

Skull and upper cervical spine: Cervical spine is detailed
separately. Negative skull and bone marrow signal.

Sinuses/Orbits: Disconjugate gaze. Paranasal sinuses and mastoids
are stable and well aerated.

Other: Grossly normal visible internal auditory structures. Negative
visible scalp and face.
IMPRESSION: 1. Small subacute white matter lacunar infarct in the left corona
radiata. No associated hemorrhage or mass effect.

2. No other acute intracranial abnormality. And elsewhere minimal
nonspecific white matter signal changes.

## 2023-06-09 ENCOUNTER — Encounter (HOSPITAL_COMMUNITY): Payer: Self-pay

## 2023-06-09 ENCOUNTER — Ambulatory Visit (HOSPITAL_COMMUNITY)
Admission: EM | Admit: 2023-06-09 | Discharge: 2023-06-09 | Disposition: A | Discharge status: Home / Self Care | Payer: Medicare HMO | Attending: Emergency Medicine | Admitting: Emergency Medicine

## 2023-06-09 DIAGNOSIS — M2351 Chronic instability of knee, right knee: Secondary | ICD-10-CM | POA: Diagnosis not present

## 2023-06-09 DIAGNOSIS — G8929 Other chronic pain: Secondary | ICD-10-CM

## 2023-06-09 DIAGNOSIS — W19XXXA Unspecified fall, initial encounter: Secondary | ICD-10-CM | POA: Diagnosis not present

## 2023-06-09 DIAGNOSIS — M25561 Pain in right knee: Secondary | ICD-10-CM | POA: Diagnosis not present

## 2023-06-09 MED ORDER — KETOROLAC TROMETHAMINE 30 MG/ML IJ SOLN
INTRAMUSCULAR | Status: AC
  Filled 2023-06-09: qty 1

## 2023-06-09 MED ORDER — KETOROLAC TROMETHAMINE 60 MG/2ML IM SOLN
30.0000 mg | Freq: Once | INTRAMUSCULAR | Status: AC
  Administered 2023-06-09: 30 mg via INTRAMUSCULAR

## 2023-06-09 NOTE — ED Provider Notes (Signed)
[facility]    CSN: [id number] Arrival date & time: 06/09/23  0831      History   Chief Complaint Chief Complaint  Patient presents with   Knee Pain    HPI [name] is a 48 y.o. male.   Patient presents to clinic for right knee pain.  This has been ongoing chronic issue.  He recently saw orthopedics where he had an x-ray and is scheduled for an MRI.  They are suspicious for a medial meniscus tear due to a positive McMurray's test.   He presents to clinic today because he is having right knee pain.  He was walking down some steps and his knee locked up and then buckled and he fell down the steps.  Is having some knee pain and swelling.  Reports he stands for 10 to 12 hours a day and this aggravates his knee pain and swelling.  Has been taking Tylenol.  Reports he was sent to the urgent care by his primary care provider.  Ambulatory.  Has had physical therapy, injections and multiple orthopedic follow-up for his knee pain.  History of CVA with right-sided deficits, which seem to be worsening his chronic right knee pain.  The history is provided by the patient and medical records.  Knee Pain   Past Medical History:  Diagnosis Date   Depression    Diabetes mellitus    Hypertension    Obesity    Sleep apnea    On CPAP machine   Stroke [facility])     Patient Active Problem List   Diagnosis Date Noted   Stroke (HCC) 05/06/2022   Weakness of right upper extremity 05/22/2021   HLD (hyperlipidemia) 04/29/2021   Acute CVA (cerebrovascular accident) (HCC) 04/29/2021   Compression of right ulnar nerve at multiple levels 07/30/2020   Paresthesias 05/22/2020   Carpal tunnel syndrome of right wrist 02/26/2020   Hypomagnesemia 03/04/2019   Ulnar neuropathy of right upper extremity 03/04/2019   Ulnar nerve compression, left 02/11/2019   Status post placement of implantable loop recorder 12/07/2018   Degenerative cervical disc 11/04/2018   COVID-19 virus infection     Suspected COVID-19 virus infection 10/09/2018   Viral gastroenteritis 10/09/2018   Acute respiratory failure with hypoxia (HCC) 10/08/2018   Medication management 08/27/2018   Cerebellar cerebrovascular accident (CVA) without late effect 05/09/2018   Bell's palsy 05/09/2018   Current use of insulin (HCC) 03/19/2018   Dry eye syndrome of both lacrimal glands 03/19/2018   Vitreous floaters of both eyes 03/19/2018   Premature ejaculation, acquired, generalized, moderate 02/26/2018   Gross hematuria 02/19/2018   Moderate episode of recurrent major depressive disorder (HCC) 02/06/2018   Grief 02/05/2018   SI (sacroiliac) pain 11/30/2017   GAD (generalized anxiety disorder) 07/20/2017   Severe nonproliferative diabetic retinopathy of both eyes without macular edema associated with type 2 diabetes mellitus (HCC) 07/20/2017   Thoracic radiculopathy 02/21/2017   Chronic right-sided low back pain with right-sided sciatica 02/16/2017   Alcohol abuse 11/01/2016   Intentional drug overdose (HCC) 10/31/2016   Severe episode of recurrent major depressive disorder, without psychotic features (HCC) 10/31/2016   Chronic pain of right knee 03/24/2016   Palpitations 08/17/2015   Vitamin D deficiency 06/24/2015   Adult situational stress disorder 06/10/2015   Recurrent depression (HCC) 06/10/2015   Umbilical hernia 06/10/2015   Acute pancreatitis 06/02/2015   Personality disorder (HCC) 04/03/2015   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 Authorizing Provider  albuterol (VENTOLIN HFA) 108 (90 Base) MCG/ACT inhaler Inhale 2 puffs into the lungs every 4 (four) hours as needed for wheezing or shortness of breath. 03/12/16   [provider]  amLODipine (NORVASC) 10 MG tablet Take 10 mg by mouth daily. 03/31/21   [provider]  blood glucose meter kit and supplies KIT Dispense based on patient and insurance preference. Use up to four times daily as directed. 04/30/21   Osvaldo Shipper, MD  calcium carbonate (TUMS - DOSED IN MG ELEMENTAL CALCIUM) 500 MG chewable tablet Chew 1,000 mg by mouth daily as needed for indigestion or heartburn.    [provider]  carvedilol (COREG) 25 MG tablet Take 25 mg by mouth 2 (two) times daily. 05/10/21   [provider]  chlorthalidone (HYGROTON) 25 MG tablet Take 25 mg by mouth daily. 11/25/20   [provider]  clopidogrel (PLAVIX) 75 MG tablet Take 1 tablet (75 mg total) by mouth daily. 05/01/21   Osvaldo Shipper, MD  dapagliflozin propanediol (FARXIGA) 10 MG TABS tablet Take 10 mg by mouth daily. 08/13/20   [provider]  ibuprofen (ADVIL) 200 MG tablet Take 400 mg by mouth every 6 (six) hours as needed for mild pain.    [provider]  Insulin Pen Needle 32G X 4 MM MISC Use as directed 04/30/21   Osvaldo Shipper, MD  losartan (COZAAR) 100 MG tablet Take 100 mg by mouth daily. 05/31/21   [provider]  metFORMIN (GLUCOPHAGE) 1000 MG tablet Take 1 tablet (1,000 mg total) by mouth 2 (two) times daily with a meal. 04/30/21   Osvaldo Shipper, MD  methocarbamol (ROBAXIN) 500 MG tablet Take 1 tablet (500 mg total) by mouth at bedtime as needed for muscle spasms. 06/25/21   Merrilee Jansky, MD  olmesartan (BENICAR) 20 MG tablet Take 20 mg by mouth daily. 11/25/20   [provider]  pregabalin (LYRICA) 150 MG capsule Take 150 mg by mouth 2 (two) times daily as needed (pain). 09/18/18   [provider]  Semaglutide,0.25 or 0.5MG /DOS, (OZEMPIC, 0.25 OR 0.5 MG/DOSE,) 2 MG/1.5ML SOPN Inject 0.5 mg into the skin every Monday. 08/20/18   [provider]  sertraline (ZOLOFT) 50 MG tablet Take 50 mg by mouth daily. 08/01/20   [provider]  spironolactone (ALDACTONE) 25 MG tablet Take 25 mg by mouth daily. 06/24/20   [provider]  traZODone (DESYREL) 100 MG tablet Take 100-300 mg by mouth at bedtime as needed for sleep. 07/26/18   [provider]    Family History Family History  Problem Relation Age of Onset   Heart disease Mother    Colon cancer Father    Cancer Paternal Grandmother    Prostate cancer Paternal Grandfather    Colon cancer Paternal Uncle    Prostate  cancer Paternal Uncle    Prostate cancer Paternal Uncle    Esophageal cancer Neg Hx    Rectal cancer Neg Hx    Stomach cancer Neg Hx     Social History Social History   Tobacco Use   Smoking status: Never   Smokeless tobacco: Never  Vaping Use   Vaping status: Never Used  Substance Use Topics   Alcohol use: No   Drug use: Never     Allergies   Percocet [oxycodone-acetaminophen]   Review of Systems Review of Systems  Per HPI   Physical Exam Triage Vital Signs ED Triage Vitals [06/09/23 0907]  Encounter Vitals Group     BP (!) 163/94     Systolic BP Percentile      Diastolic BP Percentile      Pulse Rate 99     Resp 16     Temp 98.2 F (36.8 C)     Temp Source Oral     SpO2 95 %     Weight      Height      Head Circumference      Peak Flow      Pain Score 8     Pain Loc      Pain Education      Exclude from Growth Chart    No data found.  Updated Vital Signs BP (!) 163/94 (BP Location: Left Arm)   Pulse 99   Temp 98.2 F (36.8 C) (Oral)   Resp 16   SpO2 95%   Visual Acuity Right Eye Distance:   Left Eye Distance:    Bilateral Distance:    Right Eye Near:   Left Eye Near:    Bilateral Near:     Physical Exam Vitals and nursing note reviewed.  Constitutional:      Appearance: Normal appearance.  HENT:     Head: Normocephalic and atraumatic.     Right Ear: External ear normal.     Left Ear: External ear normal.     Nose: Nose normal.     Mouth/Throat:     Mouth: Mucous membranes are moist.  Eyes:     Conjunctiva/sclera: Conjunctivae normal.  Cardiovascular:     Rate and Rhythm: Normal rate and regular rhythm.     Heart sounds: Normal heart sounds. No murmur heard. Pulmonary:     Effort: Pulmonary effort is normal. No respiratory distress.     Breath sounds: Normal breath sounds.  Musculoskeletal:        General: Swelling and tenderness present. No deformity. Normal range of motion.     Right knee: Swelling and effusion present. Normal range of motion. Tenderness present over the medial joint line.       Legs:     Comments: Right sided medial knee tenderness with an effusion.  No obvious deformity.  Ambulatory.  Range of motion intact with discomfort.  Skin:    General: Skin is warm and dry.  Neurological:     General: No focal deficit present.     Mental Status: He is alert and oriented to person, place, and time.  Psychiatric:        Mood and Affect: Mood normal.        Behavior: Behavior normal. Behavior is cooperative.      UC Treatments / Results  Labs (all labs ordered are listed, but only abnormal results are displayed) Labs Reviewed - No data to display  EKG   Radiology No results found.  Procedures Procedures (including critical care time)  Medications Ordered in UC Medications  ketorolac (TORADOL) injection 30 mg (has no administration in time range)    Initial Impression / Assessment and Plan / UC Course  I have reviewed the triage vital signs and the nursing notes.  Pertinent labs & imaging results that were available during my care of the patient were  reviewed by me and considered in my medical decision making (see chart for details).  Vitals and triage reviewed, patient is hemodynamically stable.  Right knee pain is chronic and ongoing, is being managed by orthopedics and has pending MRI.  Suspicious for medial meniscus tear.  Will give IM Toradol in clinic.  He is ambulatory, discussed repeat x-ray would most likely not be beneficial as there is low likelihood of fracture or changes from 4 days ago.  Patient agreeable to plan.  Work note provided.  Will follow-up with orthopedic.  No questions at this time.     Final Clinical Impressions(s) / UC Diagnoses   Final diagnoses:  Chronic pain of right knee  Recurrent right knee instability  Fall, initial encounter     Discharge Instructions      We have given you a Toradol injection for your right knee pain.  Please wear your braces or sleeves for compression, ice and elevate the knee.  Follow-up with your orthopedic to receive your MRI and for further investigation into the knee pain.  Return to clinic for any new or urgent symptoms.     ED Prescriptions   None    I have reviewed the PDMP during this encounter.   Rinaldo Ratel [place], [place] 06/09/23 [phone]

## 2023-06-09 NOTE — Discharge Instructions (Addendum)
We have given you a Toradol injection for your right knee pain.  Please wear your braces or sleeves for compression, ice and elevate the knee.  Follow-up with your orthopedic to receive your MRI and for further investigation into the knee pain.  Return to clinic for any new or urgent symptoms.

## 2023-06-09 NOTE — ED Triage Notes (Addendum)
Patient c/o right knee pain x 2 weeks. Patient states he saw his PCP Monday and had an x-ray. Patient states he was told he had a torn meniscus. Patient reports that his PCP told him if he continued to have pain to go to the UC. Patient states his "right knee buckled" and he fell down 5 steps and he is "now in excruciating pain."  Patient states he has been taking Tylenol and the last dose was last night.

## 2023-06-22 ENCOUNTER — Other Ambulatory Visit: Payer: Self-pay

## 2023-06-22 DIAGNOSIS — E1165 Type 2 diabetes mellitus with hyperglycemia: Secondary | ICD-10-CM

## 2023-06-26 ENCOUNTER — Other Ambulatory Visit: Payer: Medicare HMO

## 2023-06-27 LAB — MICROALBUMIN / CREATININE URINE RATIO
Creatinine, Urine: 168 mg/dL (ref 20–320)
Microalb Creat Ratio: 89 mg/g{creat} — ABNORMAL HIGH (ref <30)
Microalb, Ur: 15 mg/dL

## 2023-06-27 LAB — LIPID PANEL
Cholesterol: 175 mg/dL (ref <200)
HDL: 61 mg/dL (ref >=40)
LDL Cholesterol (Calc): 97 mg/dL
Non-HDL Cholesterol (Calc): 114 mg/dL (ref <130)
Total CHOL/HDL Ratio: 2.9 (calc) (ref <5.0)
Triglycerides: 77 mg/dL (ref <150)

## 2023-06-27 LAB — COMPREHENSIVE METABOLIC PANEL
AG Ratio: 1.6 (calc) (ref 1.0–2.5)
ALT: 12 U/L (ref 9–46)
AST: 12 U/L (ref 10–40)
Albumin: 4.3 g/dL (ref 3.6–5.1)
Alkaline phosphatase (APISO): 55 U/L (ref 36–130)
BUN: 7 mg/dL (ref 7–25)
CO2: 26 mmol/L (ref 20–32)
Calcium: 9.3 mg/dL (ref 8.6–10.3)
Chloride: 102 mmol/L (ref 98–110)
Creat: 0.78 mg/dL (ref 0.60–1.29)
Globulin: 2.7 g/dL (ref 1.9–3.7)
Glucose, Bld: 295 mg/dL — ABNORMAL HIGH (ref 65–99)
Potassium: 4 mmol/L (ref 3.5–5.3)
Sodium: 136 mmol/L (ref 135–146)
Total Bilirubin: 0.7 mg/dL (ref 0.2–1.2)
Total Protein: 7 g/dL (ref 6.1–8.1)

## 2023-06-27 LAB — HEMOGLOBIN A1C
Hgb A1c MFr Bld: 9.8 %{Hb} — ABNORMAL HIGH (ref <5.7)
Mean Plasma Glucose: 235 mg/dL
eAG (mmol/L): 13 mmol/L

## 2023-06-27 LAB — C-PEPTIDE: C-Peptide: 3.5 ng/mL (ref 0.80–3.85)

## 2023-06-28 ENCOUNTER — Other Ambulatory Visit: Payer: Self-pay

## 2023-06-28 ENCOUNTER — Encounter: Payer: Self-pay | Admitting: "Endocrinology

## 2023-06-28 ENCOUNTER — Ambulatory Visit (INDEPENDENT_AMBULATORY_CARE_PROVIDER_SITE_OTHER): Payer: Medicare HMO | Admitting: "Endocrinology

## 2023-06-28 VITALS — BP 150/80 | HR 87 | Ht 68.0 in | Wt 280.2 lb

## 2023-06-28 DIAGNOSIS — Z7984 Long term (current) use of oral hypoglycemic drugs: Secondary | ICD-10-CM | POA: Diagnosis not present

## 2023-06-28 DIAGNOSIS — E7800 Pure hypercholesterolemia, unspecified: Secondary | ICD-10-CM

## 2023-06-28 DIAGNOSIS — E1165 Type 2 diabetes mellitus with hyperglycemia: Secondary | ICD-10-CM

## 2023-06-28 MED ORDER — ATORVASTATIN CALCIUM 10 MG PO TABS
10.0000 mg | ORAL_TABLET | Freq: Every day | ORAL | 3 refills | Status: DC
  Filled 2023-06-28: qty 90, 90d supply, fill #0
  Filled 2023-11-30: qty 90, 90d supply, fill #1

## 2023-06-28 MED ORDER — DEXCOM G7 SENSOR MISC
1.0000 | 0 refills | Status: DC
  Filled 2023-06-28: qty 3, 30d supply, fill #0
  Filled 2023-07-23: qty 3, 30d supply, fill #1

## 2023-06-28 MED ORDER — LANCETS MISC. MISC
1.0000 | Freq: Three times a day (TID) | 3 refills | Status: AC
  Filled 2023-06-28 – 2023-07-23 (×2): qty 100, fill #0

## 2023-06-28 MED ORDER — TIRZEPATIDE 2.5 MG/0.5ML ~~LOC~~ SOAJ
2.5000 mg | SUBCUTANEOUS | 0 refills | Status: DC
  Filled 2023-06-28: qty 2, 28d supply, fill #0

## 2023-06-28 MED ORDER — BLOOD GLUCOSE MONITORING SUPPL DEVI
1.0000 | Freq: Three times a day (TID) | 0 refills | Status: AC
  Filled 2023-06-28 – 2023-11-30 (×3): qty 1, fill #0

## 2023-06-28 MED ORDER — BLOOD GLUCOSE TEST VI STRP
1.0000 | ORAL_STRIP | Freq: Three times a day (TID) | 3 refills | Status: AC
  Filled 2023-06-28 – 2023-07-23 (×2): qty 100, 34d supply, fill #0

## 2023-06-28 MED ORDER — LANCET DEVICE MISC
1.0000 | Freq: Three times a day (TID) | 0 refills | Status: AC
  Filled 2023-06-28 – 2023-07-23 (×2): qty 1, 30d supply, fill #0

## 2023-06-28 NOTE — Patient Instructions (Signed)

## 2023-06-28 NOTE — Progress Notes (Signed)
 Outpatient Endocrinology Note [place], MD  06/28/23   Christopher Roberson 04/04/1976 [id number]  Referring Provider: Loura Back, NP Primary Care Provider: Greer Ee., FNP Reason for consultation: Subjective   Assessment & Plan  Diagnoses and all orders for this visit:  Uncontrolled type 2 diabetes mellitus with hyperglycemia (HCC)  Long term (current) use of oral hypoglycemic drugs  Pure hypercholesterolemia  Other orders -     tirzepatide [facility]) 2.5 MG/0.5ML Pen; Inject 2.5 mg into the skin once a week. -     atorvastatin (LIPITOR) 10 MG tablet; Take 1 tablet (10 mg total) by mouth daily. -     Blood Glucose Monitoring Suppl DEVI; 1 each by Does not apply route in the morning, at noon, and at bedtime. May substitute to any manufacturer covered by patient's insurance. -     Glucose Blood (BLOOD GLUCOSE TEST STRIPS) STRP; 1 each by In Vitro route in the morning, at noon, and at bedtime. May substitute to any manufacturer covered by patient's insurance. -     Lancet Device MISC; 1 each by Does not apply route in the morning, at noon, and at bedtime. May substitute to any manufacturer covered by patient's insurance. -     Lancets Misc. MISC; 1 each by Does not apply route in the morning, at noon, and at bedtime. May substitute to any manufacturer covered by patient's insurance. -     Continuous Glucose Sensor (DEXCOM G7 SENSOR) MISC; 1 Device by Does not apply route continuous.    Diabetes Type II complicated by neuropathy, stroke, No results found for: "GFR" Hba1c goal less than 7, current Hba1c is  Lab Results  Component Value Date   HGBA1C 9.8 (H) 06/26/2023   Will recommend the following: Metformin 1000 mg bid Farxiga 10 mg every day  Mounjaro 2.5 mg qd  No known contraindications/side effects to any of above medications No history of MEN syndrome/medullary thyroid cancer/pancreatitis or pancreatic cancer in self or family   -Last LD and Tg are  as follows: Lab Results  Component Value Date   LDLCALC 97 06/26/2023    Lab Results  Component Value Date   TRIG 77 06/26/2023   -not on statin  -Follow low fat diet and exercise   -Blood pressure goal <140/90 - Microalbumin/creatinine goal is < 30 -Last MA/Cr is as follows: Lab Results  Component Value Date   MICROALBUR 15.0 06/26/2023   -on ACE/ARB losartan 50 mg qd -diet changes including salt restriction -limit eating outside -counseled BP targets per standards of diabetes care -uncontrolled blood pressure can lead to retinopathy, nephropathy and cardiovascular and atherosclerotic heart disease  Reviewed and counseled on: -A1C target -Blood sugar targets -Complications of uncontrolled diabetes  -Checking blood sugar before meals and bedtime and bring log next visit -All medications with mechanism of action and side effects -Hypoglycemia management: rule of 15's, Glucagon Emergency Kit and medical alert ID -low-carb low-fat plate-method diet -At least 20 minutes of physical activity per day -Annual dilated retinal eye exam and foot exam -compliance and follow up needs -follow up as scheduled or earlier if problem gets worse  Call if blood sugar is less than 70 or consistently above 250    Take a 15 gm snack of carbohydrate at bedtime before you go to sleep if your blood sugar is less than 100.    If you are going to fast after midnight for a test or procedure, ask your physician for instructions on how  to reduce/decrease your insulin dose.    Call if blood sugar is less than 70 or consistently above 250  -Treating a low sugar by rule of 15  (15 gms of sugar every 15 min until sugar is more than 70) If you feel your sugar is low, test your sugar to be sure If your sugar is low (less than 70), then take 15 grams of a fast acting Carbohydrate (3-4 glucose tablets or glucose gel or 4 ounces of juice or regular soda) Recheck your sugar 15 min after treating low to make  sure it is more than 70 If sugar is still less than 70, treat again with 15 grams of carbohydrate          Don't drive the hour of hypoglycemia  If unconscious/unable to eat or drink by mouth, use glucagon injection or nasal spray baqsimi and call 911. Can repeat again in 15 min if still unconscious.  Return in about 4 weeks (around 07/26/2023).   I have reviewed current medications, nurse's notes, allergies, vital signs, past medical and surgical history, family medical history, and social history for this encounter. Counseled patient on symptoms, examination findings, lab findings, imaging results, treatment decisions and monitoring and prognosis. The patient understood the recommendations and agrees with the treatment plan. All questions regarding treatment plan were fully answered.  Christopher [place], MD  06/28/23    History of Present Illness Christopher Roberson is a 48 y.o. year old male who presents for evaluation of Type II diabetes mellitus.  Christopher Roberson was first diagnosed around 2019.   Diabetes education +  Home diabetes regimen: Metformin 1000 mg bid Farxiga 10 mg every day   COMPLICATIONS - MI/+Stroke +  retinopathy +  neuropathy -  nephropathy  SYMPTOMS REVIEWED + Polyuria + Weight loss + Blurred vision  BLOOD SUGAR DATA Not checking BG  Physical Exam  BP (!) 150/80 (BP Location: Right Arm, Patient Position: Sitting)   Pulse 87   Ht 5\' 8"  (1.727 m)   Wt 280 lb 3.2 oz (127.1 kg)   SpO2 98%   BMI 42.60 kg/m    Constitutional: well developed, well nourished Head: normocephalic, atraumatic Eyes: sclera anicteric, no redness Neck: supple Lungs: normal respiratory effort Neurology: alert and oriented Skin: dry, no appreciable rashes Musculoskeletal: no appreciable defects Psychiatric: normal mood and affect Diabetic Foot Exam - Simple   No data filed      Current Medications Patient's Medications  New Prescriptions   ATORVASTATIN (LIPITOR) 10 MG  TABLET    Take 1 tablet (10 mg total) by mouth daily.   BLOOD GLUCOSE MONITORING SUPPL DEVI    1 each by Does not apply route in the morning, at noon, and at bedtime. May substitute to any manufacturer covered by patient's insurance.   CONTINUOUS GLUCOSE SENSOR (DEXCOM G7 SENSOR) MISC    1 Device by Does not apply route continuous.   GLUCOSE BLOOD (BLOOD GLUCOSE TEST STRIPS) STRP    1 each by In Vitro route in the morning, at noon, and at bedtime. May substitute to any manufacturer covered by patient's insurance.   LANCET DEVICE MISC    1 each by Does not apply route in the morning, at noon, and at bedtime. May substitute to any manufacturer covered by patient's insurance.   LANCETS MISC. MISC    1 each by Does not apply route in the morning, at noon, and at bedtime. May substitute to any manufacturer covered by patient's insurance.   TIRZEPATIDE (  MOUNJARO) 2.5 MG/0.5ML PEN    Inject 2.5 mg into the skin once a week.  Previous Medications   ALBUTEROL (VENTOLIN HFA) 108 (90 BASE) MCG/ACT INHALER    Inhale 2 puffs into the lungs every 4 (four) hours as needed for wheezing or shortness of breath.   AMLODIPINE (NORVASC) 10 MG TABLET    Take 10 mg by mouth daily.   BLOOD GLUCOSE METER KIT AND SUPPLIES KIT    Dispense based on patient and insurance preference. Use up to four times daily as directed.   CALCIUM CARBONATE (TUMS - DOSED IN MG ELEMENTAL CALCIUM) 500 MG CHEWABLE TABLET    Chew 1,000 mg by mouth daily as needed for indigestion or heartburn.   CARVEDILOL (COREG) 25 MG TABLET    Take 25 mg by mouth 2 (two) times daily.   CHLORTHALIDONE (HYGROTON) 25 MG TABLET    Take 25 mg by mouth daily.   CLOPIDOGREL (PLAVIX) 75 MG TABLET    Take 1 tablet (75 mg total) by mouth daily.   DAPAGLIFLOZIN PROPANEDIOL (FARXIGA) 10 MG TABS TABLET    Take 10 mg by mouth daily.   IBUPROFEN (ADVIL) 200 MG TABLET    Take 400 mg by mouth every 6 (six) hours as needed for mild pain.   INSULIN PEN NEEDLE 32G X 4 MM MISC     Use as directed   LOSARTAN (COZAAR) 100 MG TABLET    Take 100 mg by mouth daily.   METFORMIN (GLUCOPHAGE) 1000 MG TABLET    Take 1 tablet (1,000 mg total) by mouth 2 (two) times daily with a meal.   METHOCARBAMOL (ROBAXIN) 500 MG TABLET    Take 1 tablet (500 mg total) by mouth at bedtime as needed for muscle spasms.   MOUNJARO 2.5 MG/0.5ML PEN    INJECT 2.5 MG ONCE WEEKLY FOR DIABETES   OLMESARTAN (BENICAR) 20 MG TABLET    Take 20 mg by mouth daily.   PREGABALIN (LYRICA) 150 MG CAPSULE    Take 150 mg by mouth 2 (two) times daily as needed (pain).   SERTRALINE (ZOLOFT) 50 MG TABLET    Take 50 mg by mouth daily.   SPIRONOLACTONE (ALDACTONE) 25 MG TABLET    Take 25 mg by mouth daily.   TRAZODONE (DESYREL) 100 MG TABLET    Take 100-300 mg by mouth at bedtime as needed for sleep.  Modified Medications   No medications on file  Discontinued Medications   SEMAGLUTIDE,0.25 OR 0.5MG /DOS, (OZEMPIC, 0.25 OR 0.5 MG/DOSE,) 2 MG/1.5ML SOPN    Inject 0.5 mg into the skin every Monday.    Allergies Allergies  Allergen Reactions   Percocet [Oxycodone-Acetaminophen] Itching    Can take if takes with Benadryl    Past Medical History Past Medical History:  Diagnosis Date   Depression    Diabetes mellitus    Hypertension    Obesity    Sleep apnea    On CPAP machine   Stroke [facility])     Past Surgical History Past Surgical History:  Procedure Laterality Date   APPENDECTOMY     CARPAL TUNNEL RELEASE     ESOPHAGOGASTRODUODENOSCOPY     [facility] around 2015-2016   HERNIA REPAIR      Family History family history includes Cancer in his paternal grandmother; Colon cancer in his father and paternal uncle; Heart disease in his mother; Prostate cancer in his paternal grandfather, paternal uncle, and paternal uncle.  Social History Social History   Socioeconomic History  Marital status: Married    Spouse name: Not on file   Number of children: Not on file   Years of education: Not on  file   Highest education level: Not on file  Occupational History   Not on file  Tobacco Use   Smoking status: Never   Smokeless tobacco: Never  Vaping Use   Vaping status: Never Used  Substance and Sexual Activity   Alcohol use: No   Drug use: Never   Sexual activity: Not on file  Other Topics Concern   Not on file  Social History Narrative   Not on file   Social Drivers of Health   Financial Resource Strain: Low Risk  (05/20/2021)   Overall Financial Resource Strain (CARDIA)    Difficulty of Paying Living Expenses: Not hard at all  Recent Concern: Financial Resource Strain - Medium Risk (03/31/2021)   Received from [facility] visits prior to 06/25/2022., [facility] visits prior to 06/25/2022.   Overall Financial Resource Strain (CARDIA)    Difficulty of Paying Living Expenses: Somewhat hard  Food Insecurity: No Food Insecurity (05/18/2022)   Hunger Vital Sign    Worried About Running Out of Food in the Last Year: Never true    Ran Out of Food in the Last Year: Never true  Transportation Needs: No Transportation Needs (05/18/2022)   PRAPARE - Administrator, Civil Service (Medical): No    Lack of Transportation (Non-Medical): No  Physical Activity: Insufficiently Active (03/31/2021)   Received from [facility] visits prior to 06/25/2022., [facility] visits prior to 06/25/2022.   Exercise Vital Sign    Days of Exercise per Week: 1 day    Minutes of Exercise per Session: 20 min  Stress: No Stress Concern Present (05/31/2021)   [employer] of Occupational Health - Occupational Stress Questionnaire    Feeling of Stress : Only a little  Recent Concern: Stress - Stress Concern Present (03/31/2021)   Received from [facility] visits prior to 06/25/2022., [facility] visits prior to 06/25/2022.   [employer] of Occupational Health -  Occupational Stress Questionnaire    Feeling of Stress : Very much  Social Connections: Unknown (09/06/2021)   Received from [facility], [facility]   Social Network    Social Network: Not on file  Intimate Partner Violence: Unknown (07/29/2021)   Received from [facility], [facility]   HITS    Physically Hurt: Not on file    Insult or Talk Down To: Not on file    Threaten Physical Harm: Not on file    Scream or Curse: Not on file    Lab Results  Component Value Date   HGBA1C 9.8 (H) 06/26/2023   HGBA1C 7.0 (H) 05/06/2022   HGBA1C 10.8 (H) 04/30/2021   Lab Results  Component Value Date   CHOL 175 06/26/2023   Lab Results  Component Value Date   HDL 61 06/26/2023   Lab Results  Component Value Date   LDLCALC 97 06/26/2023   Lab Results  Component Value Date   TRIG 77 06/26/2023   Lab Results  Component Value Date   CHOLHDL 2.9 06/26/2023   Lab Results  Component Value Date   CREATININE 0.78 06/26/2023   No results found for: "GFR" Lab Results  Component Value Date   MICROALBUR 15.0 06/26/2023      Component Value Date/Time  NA 136 06/26/2023 0859   K 4.0 06/26/2023 0859   CL 102 06/26/2023 0859   CO2 26 06/26/2023 0859   GLUCOSE 295 (H) 06/26/2023 0859   BUN 7 06/26/2023 0859   CREATININE 0.78 06/26/2023 0859   CALCIUM 9.3 06/26/2023 0859   PROT 7.0 06/26/2023 0859   ALBUMIN 4.3 05/05/2022 2320   AST 12 06/26/2023 0859   ALT 12 06/26/2023 0859   ALKPHOS 42 05/05/2022 2320   BILITOT 0.7 06/26/2023 0859   GFRNONAA >60 05/05/2022 2320   GFRAA >60 10/10/2018 0330      Latest Ref Rng & Units 06/26/2023    8:59 AM 05/05/2022   11:20 PM 04/30/2021   12:23 AM  BMP  Glucose 65 - 99 mg/dL 409  811  914   BUN 7 - 25 mg/dL 7  10  11    Creatinine 0.60 - 1.29 mg/dL 7.82  9.56  2.13   BUN/Creat Ratio 6 - 22 (calc) SEE NOTE:     Sodium 135 - 146 mmol/L 136  140  134   Potassium 3.5 - 5.3 mmol/L 4.0  3.2  3.6   Chloride 98 - 110 mmol/L 102  104  103    CO2 20 - 32 mmol/L 26  27  25    Calcium 8.6 - 10.3 mg/dL 9.3  9.5  8.6        Component Value Date/Time   WBC 7.4 05/05/2022 2320   RBC 4.26 05/05/2022 2320   HGB 12.5 (L) 05/05/2022 2320   HCT 37.5 (L) 05/05/2022 2320   PLT 284 05/05/2022 2320   MCV 88.0 05/05/2022 2320   MCH 29.3 05/05/2022 2320   MCHC 33.3 05/05/2022 2320   RDW 12.9 05/05/2022 2320   LYMPHSABS 2.2 05/05/2022 2320   MONOABS 0.6 05/05/2022 2320   EOSABS 0.2 05/05/2022 2320   BASOSABS 0.0 05/05/2022 2320     Parts of this note may have been dictated using voice recognition software. There may be variances in spelling and vocabulary which are unintentional. Not all errors are proofread. Please notify the Thereasa Parkin if any discrepancies are noted or if the meaning of any statement is not clear.

## 2023-06-29 ENCOUNTER — Other Ambulatory Visit: Payer: Self-pay | Admitting: "Endocrinology

## 2023-06-29 ENCOUNTER — Other Ambulatory Visit: Payer: Self-pay

## 2023-06-29 MED ORDER — PIOGLITAZONE HCL 15 MG PO TABS: 15.0000 mg | ORAL_TABLET | Freq: Every day | ORAL | 3 refills | Status: DC

## 2023-07-12 ENCOUNTER — Telehealth: Payer: Self-pay

## 2023-07-12 ENCOUNTER — Other Ambulatory Visit (HOSPITAL_COMMUNITY): Payer: Self-pay

## 2023-07-12 NOTE — Telephone Encounter (Signed)
 Pharmacy Patient Advocate Encounter   Received notification from CoverMyMeds that prior authorization for [facility] is required/requested.   Insurance verification completed.   The patient is insured through [employer] .   Per test claim: PA required; PA submitted to above mentioned insurance via CoverMyMeds Key/confirmation #/EOC [id number] Status is pending

## 2023-07-18 NOTE — Telephone Encounter (Signed)
 Approved through 04/24/2024

## 2023-07-24 ENCOUNTER — Other Ambulatory Visit: Payer: Self-pay

## 2023-07-26 ENCOUNTER — Other Ambulatory Visit: Payer: Self-pay

## 2023-07-26 ENCOUNTER — Ambulatory Visit (INDEPENDENT_AMBULATORY_CARE_PROVIDER_SITE_OTHER): Admitting: "Endocrinology

## 2023-07-26 ENCOUNTER — Encounter: Payer: Self-pay | Admitting: "Endocrinology

## 2023-07-26 VITALS — BP 136/80 | HR 89 | Ht 68.0 in | Wt 284.0 lb

## 2023-07-26 DIAGNOSIS — E7800 Pure hypercholesterolemia, unspecified: Secondary | ICD-10-CM

## 2023-07-26 DIAGNOSIS — Z7985 Long-term (current) use of injectable non-insulin antidiabetic drugs: Secondary | ICD-10-CM | POA: Diagnosis not present

## 2023-07-26 DIAGNOSIS — Z7984 Long term (current) use of oral hypoglycemic drugs: Secondary | ICD-10-CM

## 2023-07-26 DIAGNOSIS — E1165 Type 2 diabetes mellitus with hyperglycemia: Secondary | ICD-10-CM | POA: Diagnosis not present

## 2023-07-26 MED ORDER — TRULICITY 1.5 MG/0.5ML ~~LOC~~ SOAJ
1.5000 mg | SUBCUTANEOUS | 0 refills | Status: AC
  Filled 2023-07-26: qty 2, 28d supply, fill #0

## 2023-07-26 MED ORDER — DEXCOM G7 SENSOR MISC
1.0000 | 3 refills | Status: AC
  Filled 2023-07-26: qty 9, 90d supply, fill #0
  Filled 2023-11-30: qty 9, 90d supply, fill #1

## 2023-07-26 MED ORDER — PEN NEEDLES 32G X 4 MM MISC
1.0000 | Freq: Four times a day (QID) | 2 refills | Status: AC
  Filled 2023-07-26: qty 200, 50d supply, fill #0
  Filled 2023-11-30: qty 200, 50d supply, fill #1

## 2023-07-26 MED ORDER — PIOGLITAZONE HCL 30 MG PO TABS
30.0000 mg | ORAL_TABLET | Freq: Every day | ORAL | 3 refills | Status: AC
  Filled 2023-07-26: qty 30, 30d supply, fill #0
  Filled 2023-11-30: qty 30, 30d supply, fill #1

## 2023-07-26 MED ORDER — NOVOLOG FLEXPEN 100 UNIT/ML ~~LOC~~ SOPN
PEN_INJECTOR | SUBCUTANEOUS | 11 refills | Status: DC
  Filled 2023-07-26: qty 15, 38d supply, fill #0
  Filled 2023-11-30: qty 15, 38d supply, fill #1

## 2023-07-26 NOTE — Progress Notes (Signed)
 Outpatient Endocrinology Note [facility] [place], MD  07/26/23   Christopher Roberson [birth year] [id number]  Referring Provider: Greer Ee., * Primary Care Provider: Greer Ee., FNP Reason for consultation: Subjective   Assessment & Plan  Diagnoses and all orders for this visit:  Uncontrolled type 2 diabetes mellitus with hyperglycemia (HCC)  Long term (current) use of oral hypoglycemic drugs  Long-term (current) use of injectable non-insulin antidiabetic drugs  Pure hypercholesterolemia  Other orders -     pioglitazone (ACTOS) 30 MG tablet; Take 1 tablet (30 mg total) by mouth daily. -     Dulaglutide (TRULICITY) 1.5 MG/0.5ML SOAJ; Inject 1.5 mg into the skin once a week. -     Continuous Glucose Sensor (DEXCOM G7 SENSOR) MISC; Change sensor every 3 days. -     insulin aspart (NOVOLOG FLEXPEN) 100 UNIT/ML FlexPen; Correction scale: max dose 40 units/day -     Insulin Pen Needle (PEN NEEDLES) 32G X 4 MM MISC; Use as directed in the morning, at noon, in the evening, and at bedtime.     Diabetes Type II complicated by neuropathy, stroke, No results found for: "GFR" Hba1c goal less than 7, current Hba1c is  Lab Results  Component Value Date   HGBA1C 9.8 (H) 06/26/2023   Will recommend the following: Metformin 1000 mg bid Farxiga 10 mg every day  Trulicity 1.5 mg qd Novolog correction scale: Use with your meal time/short acting insulin based on blood sugars as follows:  201 - 225: 3 units 226 - 250: 4 units 251 - 275: 5 units 276 - 300: 6 units 301 - 325: 7 units 326 - 350: 8 units 351 - 375: 9 units 376 - 400: 10 units   No known contraindications/side effects to any of above medications No history of MEN syndrome/medullary thyroid cancer/pancreatitis or pancreatic cancer in self or family   -Last LD and Tg are as follows: Lab Results  Component Value Date   LDLCALC 97 06/26/2023    Lab Results  Component Value Date   TRIG 77 06/26/2023    -On atorvastatin 10 mg every day  -Follow low fat diet and exercise   -Blood pressure goal <140/90 - Microalbumin/creatinine goal is < 30 -Last MA/Cr is as follows: Lab Results  Component Value Date   MICROALBUR 15.0 06/26/2023   -on ACE/ARB losartan 50 mg qd -diet changes including salt restriction -limit eating outside -counseled BP targets per standards of diabetes care -uncontrolled blood pressure can lead to retinopathy, nephropathy and cardiovascular and atherosclerotic heart disease  Reviewed and counseled on: -A1C target -Blood sugar targets -Complications of uncontrolled diabetes  -Checking blood sugar before meals and bedtime and bring log next visit -All medications with mechanism of action and side effects -Hypoglycemia management: rule of 15's, Glucagon Emergency Kit and medical alert ID -low-carb low-fat plate-method diet -At least 20 minutes of physical activity per day -Annual dilated retinal eye exam and foot exam -compliance and follow up needs -follow up as scheduled or earlier if problem gets worse  Call if blood sugar is less than 70 or consistently above 250    Take a 15 gm snack of carbohydrate at bedtime before you go to sleep if your blood sugar is less than 100.    If you are going to fast after midnight for a test or procedure, ask your physician for instructions on how to reduce/decrease your insulin dose.    Call if blood sugar is less than 70  or consistently above 250  -Treating a low sugar by rule of 15  (15 gms of sugar every 15 min until sugar is more than 70) If you feel your sugar is low, test your sugar to be sure If your sugar is low (less than 70), then take 15 grams of a fast acting Carbohydrate (3-4 glucose tablets or glucose gel or 4 ounces of juice or regular soda) Recheck your sugar 15 min after treating low to make sure it is more than 70 If sugar is still less than 70, treat again with 15 grams of carbohydrate          Don't  drive the hour of hypoglycemia  If unconscious/unable to eat or drink by mouth, use glucagon injection or nasal spray baqsimi and call 911. Can repeat again in 15 min if still unconscious.  Return in about 4 weeks (around 08/23/2023).   I have reviewed current medications, nurse's notes, allergies, vital signs, past medical and surgical history, family medical history, and social history for this encounter. Counseled patient on symptoms, examination findings, lab findings, imaging results, treatment decisions and monitoring and prognosis. The patient understood the recommendations and agrees with the treatment plan. All questions regarding treatment plan were fully answered.  [facility] [place], MD  07/26/23    History of Present Illness Christopher Roberson is a 48 y.o. year old male who presents for evaluation of Type II diabetes mellitus.  Christopher Roberson was first diagnosed around 2019.   Diabetes education +  Home diabetes regimen: Metformin 1000 mg bid Farxiga 10 mg every day Pioglitazone 15 mg every day   Trulicity 0.75 mg every day  COMPLICATIONS - MI/+Stroke +  retinopathy +  neuropathy -  nephropathy  SYMPTOMS REVIEWED + Polyuria + Weight loss - Blurred vision  BLOOD SUGAR DATA Checking at home 3 times a day Did not bring meter  Per recall: 157-332   Physical Exam  BP 136/80   Pulse 89   Ht 5\' 8"  (1.727 m)   Wt 284 lb (128.8 kg)   SpO2 98%   BMI 43.18 kg/m    Constitutional: well developed, well nourished Head: normocephalic, atraumatic Eyes: sclera anicteric, no redness Neck: supple Lungs: normal respiratory effort Neurology: alert and oriented Skin: dry, no appreciable rashes Musculoskeletal: no appreciable defects Psychiatric: normal mood and affect Diabetic Foot Exam - Simple   No data filed      Current Medications Patient's Medications  New Prescriptions   DULAGLUTIDE (TRULICITY) 1.5 MG/0.5ML SOAJ    Inject 1.5 mg into the skin once a week.    INSULIN ASPART (NOVOLOG FLEXPEN) 100 UNIT/ML FLEXPEN    Correction scale: max dose 40 units/day   INSULIN PEN NEEDLE (PEN NEEDLES) 32G X 4 MM MISC    Use as directed in the morning, at noon, in the evening, and at bedtime.   PIOGLITAZONE (ACTOS) 30 MG TABLET    Take 1 tablet (30 mg total) by mouth daily.  Previous Medications   ALBUTEROL (VENTOLIN HFA) 108 (90 BASE) MCG/ACT INHALER    Inhale 2 puffs into the lungs every 4 (four) hours as needed for wheezing or shortness of breath.   AMLODIPINE (NORVASC) 10 MG TABLET    Take 10 mg by mouth daily.   ATORVASTATIN (LIPITOR) 10 MG TABLET    Take 1 tablet (10 mg total) by mouth daily.   BLOOD GLUCOSE METER KIT AND SUPPLIES KIT    Dispense based on patient and insurance preference. Use up to four  times daily as directed.   BLOOD GLUCOSE MONITORING SUPPL DEVI    1 each by Does not apply route in the morning, at noon, and at bedtime. May substitute to any manufacturer covered by patient's insurance.   CALCIUM CARBONATE (TUMS - DOSED IN MG ELEMENTAL CALCIUM) 500 MG CHEWABLE TABLET    Chew 1,000 mg by mouth daily as needed for indigestion or heartburn.   CARVEDILOL (COREG) 25 MG TABLET    Take 25 mg by mouth 2 (two) times daily.   CHLORTHALIDONE (HYGROTON) 25 MG TABLET    Take 25 mg by mouth daily.   CLOPIDOGREL (PLAVIX) 75 MG TABLET    Take 1 tablet (75 mg total) by mouth daily.   DAPAGLIFLOZIN PROPANEDIOL (FARXIGA) 10 MG TABS TABLET    Take 10 mg by mouth daily.   GLUCOSE BLOOD (BLOOD GLUCOSE TEST STRIPS) STRP    Testing in the morning, at noon, and at bedtime. May substitute to any manufacturer covered by patient's insurance.   IBUPROFEN (ADVIL) 200 MG TABLET    Take 400 mg by mouth every 6 (six) hours as needed for mild pain.   INSULIN PEN NEEDLE 32G X 4 MM MISC    Use as directed   LANCET DEVICE MISC    1 each by Does not apply route in the morning, at noon, and at bedtime. May substitute to any manufacturer covered by patient's insurance.   LANCETS  MISC. MISC    1 each by Does not apply route in the morning, at noon, and at bedtime. May substitute to any manufacturer covered by patient's insurance.   LOSARTAN (COZAAR) 100 MG TABLET    Take 100 mg by mouth daily.   METFORMIN (GLUCOPHAGE) 1000 MG TABLET    Take 1 tablet (1,000 mg total) by mouth 2 (two) times daily with a meal.   METHOCARBAMOL (ROBAXIN) 500 MG TABLET    Take 1 tablet (500 mg total) by mouth at bedtime as needed for muscle spasms.   OLMESARTAN (BENICAR) 20 MG TABLET    Take 20 mg by mouth daily.   PREGABALIN (LYRICA) 150 MG CAPSULE    Take 150 mg by mouth 2 (two) times daily as needed (pain).   SERTRALINE (ZOLOFT) 50 MG TABLET    Take 50 mg by mouth daily.   SPIRONOLACTONE (ALDACTONE) 25 MG TABLET    Take 25 mg by mouth daily.   TRAZODONE (DESYREL) 100 MG TABLET    Take 100-300 mg by mouth at bedtime as needed for sleep.  Modified Medications   Modified Medication Previous Medication   CONTINUOUS GLUCOSE SENSOR (DEXCOM G7 SENSOR) MISC Continuous Glucose Sensor (DEXCOM G7 SENSOR) MISC      Change sensor every 3 days.    Change sensor every 3 days.  Discontinued Medications   PIOGLITAZONE (ACTOS) 15 MG TABLET    Take 1 tablet (15 mg total) by mouth daily.   TRULICITY 0.75 MG/0.5ML SOAJ    INJECT 0.75 MG UNDER THE SKIN ONCE WEEKLY    Allergies Allergies  Allergen Reactions   Percocet [Oxycodone-Acetaminophen] Itching    Can take if takes with Benadryl    Past Medical History Past Medical History:  Diagnosis Date   Depression    Diabetes mellitus    Hypertension    Obesity    Sleep apnea    On CPAP machine   Stroke [facility])     Past Surgical History Past Surgical History:  Procedure Laterality Date   APPENDECTOMY  CARPAL TUNNEL RELEASE     ESOPHAGOGASTRODUODENOSCOPY     [facility] around 2015-2016   HERNIA REPAIR      Family History family history includes Cancer in his paternal grandmother; Colon cancer in his father and paternal uncle; Heart  disease in his mother; Prostate cancer in his paternal grandfather, paternal uncle, and paternal uncle.  Social History Social History   Socioeconomic History   Marital status: Married    Spouse name: Not on file   Number of children: Not on file   Years of education: Not on file   Highest education level: Not on file  Occupational History   Not on file  Tobacco Use   Smoking status: Never   Smokeless tobacco: Never  Vaping Use   Vaping status: Never Used  Substance and Sexual Activity   Alcohol use: No   Drug use: Never   Sexual activity: Not on file  Other Topics Concern   Not on file  Social History Narrative   Not on file   Social Drivers of Health   Financial Resource Strain: Low Risk  (05/20/2021)   Overall Financial Resource Strain (CARDIA)    Difficulty of Paying Living Expenses: Not hard at all  Recent Concern: Financial Resource Strain - Medium Risk (03/31/2021)   Received from [facility] visits prior to 06/25/2022., [facility] visits prior to 06/25/2022.   Overall Financial Resource Strain (CARDIA)    Difficulty of Paying Living Expenses: Somewhat hard  Food Insecurity: Low Risk  (07/14/2023)   Received from [facility]   Hunger Vital Sign    Worried About Running Out of Food in the Last Year: Never true    Ran Out of Food in the Last Year: Never true  Transportation Needs: No Transportation Needs (07/14/2023)   Received from [facility]    In the past 12 months, has lack of reliable transportation kept you from medical appointments, meetings, work or from getting things needed for daily living? : No  Physical Activity: Insufficiently Active (03/31/2021)   Received from [facility] visits prior to 06/25/2022., [facility] visits prior to 06/25/2022.   Exercise Vital Sign    Days of Exercise per Week: 1 day    Minutes of Exercise per Session: 20 min  Stress: No  Stress Concern Present (05/31/2021)   [employer] of Occupational Health - Occupational Stress Questionnaire    Feeling of Stress : Only a little  Recent Concern: Stress - Stress Concern Present (03/31/2021)   Received from [facility] visits prior to 06/25/2022., [facility] visits prior to 06/25/2022.   [employer] of Occupational Health - Occupational Stress Questionnaire    Feeling of Stress : Very much  Social Connections: Unknown (09/06/2021)   Received from [facility], [facility]   Social Network    Social Network: Not on file  Intimate Partner Violence: Unknown (07/29/2021)   Received from [facility], [facility]   HITS    Physically Hurt: Not on file    Insult or Talk Down To: Not on file    Threaten Physical Harm: Not on file    Scream or Curse: Not on file    Lab Results  Component Value Date   HGBA1C 9.8 (H) 06/26/2023   HGBA1C 7.0 (H) 05/06/2022   HGBA1C 10.8 (H) 04/30/2021   Lab Results  Component Value Date  CHOL 175 06/26/2023   Lab Results  Component Value Date   HDL 61 06/26/2023   Lab Results  Component Value Date   LDLCALC 97 06/26/2023   Lab Results  Component Value Date   TRIG 77 06/26/2023   Lab Results  Component Value Date   CHOLHDL 2.9 06/26/2023   Lab Results  Component Value Date   CREATININE 0.78 06/26/2023   No results found for: "GFR" Lab Results  Component Value Date   MICROALBUR 15.0 06/26/2023      Component Value Date/Time   NA 136 06/26/2023 0859   K 4.0 06/26/2023 0859   CL 102 06/26/2023 0859   CO2 26 06/26/2023 0859   GLUCOSE 295 (H) 06/26/2023 0859   BUN 7 06/26/2023 0859   CREATININE 0.78 06/26/2023 0859   CALCIUM 9.3 06/26/2023 0859   PROT 7.0 06/26/2023 0859   ALBUMIN 4.3 05/05/2022 2320   AST 12 06/26/2023 0859   ALT 12 06/26/2023 0859   ALKPHOS 42 05/05/2022 2320   BILITOT 0.7 06/26/2023 0859   GFRNONAA >60 05/05/2022 2320   GFRAA >60  10/10/2018 0330      Latest Ref Rng & Units 06/26/2023    8:59 AM 05/05/2022   11:20 PM 04/30/2021   12:23 AM  BMP  Glucose 65 - 99 mg/dL 132  440  102   BUN 7 - 25 mg/dL 7  10  11    Creatinine 0.60 - 1.29 mg/dL 7.25  3.66  4.40   BUN/Creat Ratio 6 - 22 (calc) SEE NOTE:     Sodium 135 - 146 mmol/L 136  140  134   Potassium 3.5 - 5.3 mmol/L 4.0  3.2  3.6   Chloride 98 - 110 mmol/L 102  104  103   CO2 20 - 32 mmol/L 26  27  25    Calcium 8.6 - 10.3 mg/dL 9.3  9.5  8.6        Component Value Date/Time   WBC 7.4 05/05/2022 2320   RBC 4.26 05/05/2022 2320   HGB 12.5 (L) 05/05/2022 2320   HCT 37.5 (L) 05/05/2022 2320   PLT 284 05/05/2022 2320   MCV 88.0 05/05/2022 2320   MCH 29.3 05/05/2022 2320   MCHC 33.3 05/05/2022 2320   RDW 12.9 05/05/2022 2320   LYMPHSABS 2.2 05/05/2022 2320   MONOABS 0.6 05/05/2022 2320   EOSABS 0.2 05/05/2022 2320   BASOSABS 0.0 05/05/2022 2320     Parts of this note may have been dictated using voice recognition software. There may be variances in spelling and vocabulary which are unintentional. Not all errors are proofread. Please notify the Thereasa Parkin if any discrepancies are noted or if the meaning of any statement is not clear.

## 2023-07-26 NOTE — Patient Instructions (Signed)
  Correction scale: Use with your meal time/short acting insulin based on blood sugars as follows:  201 - 225: 3 units 226 - 250: 4 units 251 - 275: 5 units 276 - 300: 6 units 301 - 325: 7 units 326 - 350: 8 units 351 - 375: 9 units 376 - 400: 10 units

## 2023-07-27 ENCOUNTER — Other Ambulatory Visit: Payer: Self-pay

## 2023-07-27 ENCOUNTER — Encounter: Payer: Self-pay | Admitting: "Endocrinology

## 2023-07-28 ENCOUNTER — Other Ambulatory Visit: Payer: Self-pay

## 2023-07-31 ENCOUNTER — Other Ambulatory Visit: Payer: Self-pay

## 2023-08-23 ENCOUNTER — Ambulatory Visit: Admitting: "Endocrinology

## 2023-08-28 ENCOUNTER — Ambulatory Visit: Admitting: "Endocrinology

## 2023-10-04 ENCOUNTER — Encounter (INDEPENDENT_AMBULATORY_CARE_PROVIDER_SITE_OTHER): Admitting: Ophthalmology

## 2023-10-05 ENCOUNTER — Ambulatory Visit: Admitting: "Endocrinology

## 2023-10-10 ENCOUNTER — Other Ambulatory Visit: Payer: Self-pay

## 2023-11-30 ENCOUNTER — Other Ambulatory Visit: Payer: Self-pay

## 2024-02-03 ENCOUNTER — Emergency Department (HOSPITAL_COMMUNITY)

## 2024-02-03 ENCOUNTER — Inpatient Hospital Stay (HOSPITAL_COMMUNITY)
Admission: EM | Admit: 2024-02-03 | Discharge: 2024-02-12 | DRG: 872 | Disposition: A | Discharge status: Home Health | Attending: Internal Medicine | Admitting: Internal Medicine

## 2024-02-03 DIAGNOSIS — I10 Essential (primary) hypertension: Secondary | ICD-10-CM | POA: Diagnosis present

## 2024-02-03 DIAGNOSIS — E119 Type 2 diabetes mellitus without complications: Secondary | ICD-10-CM

## 2024-02-03 DIAGNOSIS — Z9049 Acquired absence of other specified parts of digestive tract: Secondary | ICD-10-CM

## 2024-02-03 DIAGNOSIS — D649 Anemia, unspecified: Secondary | ICD-10-CM | POA: Diagnosis not present

## 2024-02-03 DIAGNOSIS — G4733 Obstructive sleep apnea (adult) (pediatric): Secondary | ICD-10-CM | POA: Diagnosis present

## 2024-02-03 DIAGNOSIS — N186 End stage renal disease: Secondary | ICD-10-CM | POA: Diagnosis present

## 2024-02-03 DIAGNOSIS — L03115 Cellulitis of right lower limb: Secondary | ICD-10-CM | POA: Diagnosis present

## 2024-02-03 DIAGNOSIS — Z7984 Long term (current) use of oral hypoglycemic drugs: Secondary | ICD-10-CM

## 2024-02-03 DIAGNOSIS — Z992 Dependence on renal dialysis: Secondary | ICD-10-CM

## 2024-02-03 DIAGNOSIS — L02415 Cutaneous abscess of right lower limb: Secondary | ICD-10-CM | POA: Diagnosis present

## 2024-02-03 DIAGNOSIS — Z885 Allergy status to narcotic agent status: Secondary | ICD-10-CM

## 2024-02-03 DIAGNOSIS — Z8249 Family history of ischemic heart disease and other diseases of the circulatory system: Secondary | ICD-10-CM

## 2024-02-03 DIAGNOSIS — G4730 Sleep apnea, unspecified: Secondary | ICD-10-CM | POA: Diagnosis present

## 2024-02-03 DIAGNOSIS — F32A Depression, unspecified: Secondary | ICD-10-CM | POA: Diagnosis present

## 2024-02-03 DIAGNOSIS — E1122 Type 2 diabetes mellitus with diabetic chronic kidney disease: Secondary | ICD-10-CM | POA: Diagnosis present

## 2024-02-03 DIAGNOSIS — R2990 Unspecified symptoms and signs involving the nervous system: Principal | ICD-10-CM

## 2024-02-03 DIAGNOSIS — I120 Hypertensive chronic kidney disease with stage 5 chronic kidney disease or end stage renal disease: Secondary | ICD-10-CM | POA: Diagnosis present

## 2024-02-03 DIAGNOSIS — Z7902 Long term (current) use of antithrombotics/antiplatelets: Secondary | ICD-10-CM

## 2024-02-03 DIAGNOSIS — D75839 Thrombocytosis, unspecified: Secondary | ICD-10-CM | POA: Diagnosis present

## 2024-02-03 DIAGNOSIS — Z6841 Body Mass Index (BMI) 40.0 and over, adult: Secondary | ICD-10-CM

## 2024-02-03 DIAGNOSIS — Z1152 Encounter for screening for COVID-19: Secondary | ICD-10-CM

## 2024-02-03 DIAGNOSIS — E871 Hypo-osmolality and hyponatremia: Secondary | ICD-10-CM | POA: Diagnosis present

## 2024-02-03 DIAGNOSIS — E785 Hyperlipidemia, unspecified: Secondary | ICD-10-CM | POA: Diagnosis present

## 2024-02-03 DIAGNOSIS — R531 Weakness: Secondary | ICD-10-CM | POA: Diagnosis not present

## 2024-02-03 DIAGNOSIS — I69351 Hemiplegia and hemiparesis following cerebral infarction affecting right dominant side: Secondary | ICD-10-CM

## 2024-02-03 DIAGNOSIS — Z79899 Other long term (current) drug therapy: Secondary | ICD-10-CM

## 2024-02-03 DIAGNOSIS — E66813 Obesity, class 3: Secondary | ICD-10-CM | POA: Diagnosis present

## 2024-02-03 DIAGNOSIS — L0390 Cellulitis, unspecified: Secondary | ICD-10-CM

## 2024-02-03 DIAGNOSIS — A419 Sepsis, unspecified organism: Principal | ICD-10-CM | POA: Diagnosis present

## 2024-02-03 DIAGNOSIS — L02425 Furuncle of right lower limb: Secondary | ICD-10-CM | POA: Diagnosis present

## 2024-02-03 DIAGNOSIS — E1165 Type 2 diabetes mellitus with hyperglycemia: Secondary | ICD-10-CM | POA: Diagnosis not present

## 2024-02-03 DIAGNOSIS — N179 Acute kidney failure, unspecified: Secondary | ICD-10-CM | POA: Diagnosis present

## 2024-02-03 DIAGNOSIS — Z794 Long term (current) use of insulin: Secondary | ICD-10-CM

## 2024-02-03 DIAGNOSIS — K219 Gastro-esophageal reflux disease without esophagitis: Secondary | ICD-10-CM | POA: Diagnosis present

## 2024-02-03 DIAGNOSIS — E876 Hypokalemia: Secondary | ICD-10-CM | POA: Insufficient documentation

## 2024-02-03 LAB — COMPREHENSIVE METABOLIC PANEL WITH GFR
ALT: 15 U/L (ref 0–44)
AST: 19 U/L (ref 15–41)
Albumin: 4.3 g/dL (ref 3.5–5.0)
Alkaline Phosphatase: 78 U/L (ref 38–126)
Anion gap: 11 (ref 5–15)
BUN: 12 mg/dL (ref 6–20)
CO2: 29 mmol/L (ref 22–32)
Calcium: 10 mg/dL (ref 8.9–10.3)
Chloride: 97 mmol/L — ABNORMAL LOW (ref 98–111)
Creatinine, Ser: 1.05 mg/dL (ref 0.61–1.24)
GFR, Estimated: >60 mL/min (ref >60)
Glucose, Bld: 259 mg/dL — ABNORMAL HIGH (ref 70–99)
Potassium: 3.7 mmol/L (ref 3.5–5.1)
Sodium: 137 mmol/L (ref 135–145)
Total Bilirubin: 1.5 mg/dL — ABNORMAL HIGH (ref 0.0–1.2)
Total Protein: 8.2 g/dL — ABNORMAL HIGH (ref 6.5–8.1)

## 2024-02-03 LAB — ETHANOL: Alcohol, Ethyl (B): <15 mg/dL (ref <15)

## 2024-02-03 LAB — I-STAT CHEM 8, ED
BUN: 12 mg/dL (ref 6–20)
Calcium, Ion: 1.17 mmol/L (ref 1.15–1.40)
Chloride: 100 mmol/L (ref 98–111)
Creatinine, Ser: 0.9 mg/dL (ref 0.61–1.24)
Glucose, Bld: 278 mg/dL — ABNORMAL HIGH (ref 70–99)
HCT: 39 % (ref 39.0–52.0)
Hemoglobin: 13.3 g/dL (ref 13.0–17.0)
Potassium: 3.5 mmol/L (ref 3.5–5.1)
Sodium: 136 mmol/L (ref 135–145)
TCO2: 25 mmol/L (ref 22–32)

## 2024-02-03 LAB — CBC
HCT: 40.1 % (ref 39.0–52.0)
Hemoglobin: 13 g/dL (ref 13.0–17.0)
MCH: 29.1 pg (ref 26.0–34.0)
MCHC: 32.4 g/dL (ref 30.0–36.0)
MCV: 89.7 fL (ref 80.0–100.0)
Platelets: 309 K/uL (ref 150–400)
RBC: 4.47 MIL/uL (ref 4.22–5.81)
RDW: 11.9 % (ref 11.5–15.5)
WBC: 17.4 K/uL — ABNORMAL HIGH (ref 4.0–10.5)
nRBC: 0 % (ref 0.0–0.2)

## 2024-02-03 LAB — DIFFERENTIAL
Abs Immature Granulocytes: 0.08 K/uL — ABNORMAL HIGH (ref 0.00–0.07)
Basophils Absolute: 0.1 K/uL (ref 0.0–0.1)
Basophils Relative: 0 %
Eosinophils Absolute: 0.2 K/uL (ref 0.0–0.5)
Eosinophils Relative: 1 %
Immature Granulocytes: 1 %
Lymphocytes Relative: 9 %
Lymphs Abs: 1.6 K/uL (ref 0.7–4.0)
Monocytes Absolute: 1.1 K/uL — ABNORMAL HIGH (ref 0.1–1.0)
Monocytes Relative: 6 %
Neutro Abs: 14.5 K/uL — ABNORMAL HIGH (ref 1.7–7.7)
Neutrophils Relative %: 83 %

## 2024-02-03 LAB — PROTIME-INR
INR: 1.1 (ref 0.8–1.2)
Prothrombin Time: 14.5 s (ref 11.4–15.2)

## 2024-02-03 LAB — APTT: aPTT: 33 s (ref 24–36)

## 2024-02-03 LAB — RESP PANEL BY RT-PCR (RSV, FLU A&B, COVID)  RVPGX2
Influenza A by PCR: NEGATIVE
Influenza B by PCR: NEGATIVE
Resp Syncytial Virus by PCR: NEGATIVE
SARS Coronavirus 2 by RT PCR: NEGATIVE

## 2024-02-03 MED ORDER — ACETAMINOPHEN 325 MG PO TABS
650.0000 mg | ORAL_TABLET | Freq: Once | ORAL | Status: AC
  Administered 2024-02-03: 650 mg via ORAL
  Filled 2024-02-03: qty 2

## 2024-02-03 MED ORDER — IOHEXOL 350 MG/ML SOLN
75.0000 mL | Freq: Once | INTRAVENOUS | Status: AC | PRN
  Administered 2024-02-03: 75 mL via INTRAVENOUS

## 2024-02-03 MED ORDER — SODIUM CHLORIDE 0.9 % IV SOLN
2.0000 g | Freq: Once | INTRAVENOUS | Status: AC
  Administered 2024-02-03: 2 g via INTRAVENOUS
  Filled 2024-02-03: qty 20

## 2024-02-03 NOTE — ED Provider Notes (Signed)
 [place] EMERGENCY DEPARTMENT AT [facility] Provider Note   CSN: [id number] Arrival date & time: 02/03/24  2136     Patient presents with: No chief complaint on file.   Christopher Roberson is a 48 y.o. male.  {Add pertinent medical, surgical, social history, OB history to YEP:67052} Patient has a history of diabetes and hypertension and has had a stroke before.  Patient states about 2 hours before arriving to the emergency department he started to have some weakness in his right arm and right leg with some numbness.  The symptoms are getting better but not completely gone.  His brother said that his face was drooping also on the right side.  Code stroke was called immediately  The history is provided by the patient and medical records. No language interpreter was used.  Weakness Severity:  Mild Onset quality:  Sudden Timing:  Constant Progression:  Improving Chronicity:  Recurrent Context: not alcohol use   Relieved by:  Nothing Worsened by:  Nothing Ineffective treatments:  None tried Associated symptoms: no abdominal pain, no chest pain, no cough, no diarrhea, no frequency, no headaches and no seizures        Prior to Admission medications   Medication Sig Start Date End Date Taking? Authorizing Provider  albuterol  (VENTOLIN  HFA) 108 (90 Base) MCG/ACT inhaler Inhale 2 puffs into the lungs every 4 (four) hours as needed for wheezing or shortness of breath. 03/12/16   [provider]  amLODipine  (NORVASC ) 10 MG tablet Take 10 mg by mouth daily. 03/31/21   [provider]  atorvastatin  (LIPITOR ) 10 MG tablet Take 1 tablet (10 mg total) by mouth daily. 06/28/23   Motwani, Komal, MD  blood glucose meter kit and supplies KIT Dispense based on patient and insurance preference. Use up to four times daily as directed. 04/30/21   Krishnan, Gokul, MD  Blood Glucose Monitoring Suppl DEVI 1 each by Does not apply route in the morning, at noon, and at  bedtime. May substitute to any manufacturer covered by patient's insurance. 06/28/23   Motwani, Komal, MD  calcium  carbonate (TUMS - DOSED IN MG ELEMENTAL CALCIUM ) 500 MG chewable tablet Chew 1,000 mg by mouth daily as needed for indigestion or heartburn.    [provider]  carvedilol  (COREG ) 25 MG tablet Take 25 mg by mouth 2 (two) times daily. 05/10/21   [provider]  chlorthalidone  (HYGROTON ) 25 MG tablet Take 25 mg by mouth daily. 11/25/20   [provider]  clopidogrel  (PLAVIX ) 75 MG tablet Take 1 tablet (75 mg total) by mouth daily. 05/01/21   Krishnan, Gokul, MD  Continuous Glucose Sensor (DEXCOM G7 SENSOR) MISC Change sensor every 3 days. 07/26/23   Motwani, Komal, MD  dapagliflozin propanediol (FARXIGA) 10 MG TABS tablet Take 10 mg by mouth daily. 08/13/20   [provider]  ibuprofen  (ADVIL ) 200 MG tablet Take 400 mg by mouth every 6 (six) hours as needed for mild pain.    [provider]  insulin  aspart (NOVOLOG  FLEXPEN) 100 UNIT/ML FlexPen Correction scale: max dose 40 units/day 07/26/23   Motwani, Komal, MD  Insulin  Pen Needle (PEN NEEDLES) 32G X 4 MM MISC Use as directed in the morning, at noon, in the evening, and at bedtime. 07/26/23   Motwani, Komal, MD  Insulin  Pen Needle 32G X 4 MM MISC Use as directed 04/30/21   Krishnan, Gokul, MD  losartan  (COZAAR ) 100 MG tablet Take 100 mg by mouth daily. 05/31/21  [provider]  metFORMIN  (GLUCOPHAGE ) 1000 MG tablet Take 1 tablet (1,000 mg total) by mouth 2 (two) times daily with a meal. 04/30/21   Verdene Purchase, MD  methocarbamol  (ROBAXIN ) 500 MG tablet Take 1 tablet (500 mg total) by mouth at bedtime as needed for muscle spasms. 06/25/21   Blaise Aleene KIDD, MD  olmesartan (BENICAR) 20 MG tablet Take 20 mg by mouth daily. 11/25/20   [provider]  pioglitazone  (ACTOS ) 30 MG tablet Take 1 tablet (30 mg total) by mouth daily. 07/26/23 08/25/23  Motwani, Komal, MD  pregabalin  (LYRICA ) 150 MG capsule  Take 150 mg by mouth 2 (two) times daily as needed (pain). 09/18/18   [provider]  sertraline  (ZOLOFT ) 50 MG tablet Take 50 mg by mouth daily. 08/01/20   [provider]  spironolactone  (ALDACTONE ) 25 MG tablet Take 25 mg by mouth daily. 06/24/20   [provider]  traZODone  (DESYREL ) 100 MG tablet Take 100-300 mg by mouth at bedtime as needed for sleep. 07/26/18   [provider]    Allergies: Percocet [oxycodone-acetaminophen ]    Review of Systems  Constitutional:  Negative for appetite change and fatigue.  HENT:  Negative for congestion, ear discharge and sinus pressure.   Eyes:  Negative for discharge.  Respiratory:  Negative for cough.   Cardiovascular:  Negative for chest pain.  Gastrointestinal:  Negative for abdominal pain and diarrhea.  Genitourinary:  Negative for frequency and hematuria.  Musculoskeletal:  Negative for back pain.  Skin:  Negative for rash.  Neurological:  Positive for weakness. Negative for seizures and headaches.  Psychiatric/Behavioral:  Negative for hallucinations.     Updated Vital Signs BP (!) 163/80   Pulse 79   Temp (!) 101.1 F (38.4 C) (Oral)   Resp 12   SpO2 98%   Physical Exam Vitals and nursing note reviewed.  Constitutional:      Appearance: He is well-developed.  HENT:     Head: Normocephalic.     Nose: Nose normal.  Eyes:     General: No scleral icterus.    Conjunctiva/sclera: Conjunctivae normal.  Neck:     Thyroid: No thyromegaly.  Cardiovascular:     Rate and Rhythm: Normal rate and regular rhythm.     Heart sounds: No murmur heard.    No friction rub. No gallop.  Pulmonary:     Breath sounds: No stridor. No wheezing or rales.  Chest:     Chest wall: No tenderness.  Abdominal:     General: There is no distension.     Tenderness: There is no abdominal tenderness. There is no rebound.  Musculoskeletal:     Cervical back: Neck supple.  Lymphadenopathy:     Cervical: No cervical  adenopathy.  Skin:    Findings: Erythema and rash present.     Comments: Patient has a cellulitis to the right lower leg  Neurological:     Mental Status: He is alert and oriented to person, place, and time.     Motor: No abnormal muscle tone.     Coordination: Coordination normal.     Comments: Mild numbness and weakness in his right arm and right leg  Psychiatric:        Behavior: Behavior normal.     (all labs ordered are listed, but only abnormal results are displayed) Labs Reviewed  CBC - Abnormal; Notable for the following components:      Result Value   WBC 17.4 (*)  All other components within normal limits  DIFFERENTIAL - Abnormal; Notable for the following components:   Neutro Abs 14.5 (*)    Monocytes Absolute 1.1 (*)    Abs Immature Granulocytes 0.08 (*)    All other components within normal limits  COMPREHENSIVE METABOLIC PANEL WITH GFR - Abnormal; Notable for the following components:   Chloride 97 (*)    Glucose, Bld 259 (*)    Total Protein 8.2 (*)    Total Bilirubin 1.5 (*)    All other components within normal limits  I-STAT CHEM 8, ED - Abnormal; Notable for the following components:   Glucose, Bld 278 (*)    All other components within normal limits  RESP PANEL BY RT-PCR (RSV, FLU A&B, COVID)  RVPGX2  URINE CULTURE  PROTIME-INR  APTT  ETHANOL  URINE DRUG SCREEN  URINALYSIS, ROUTINE W REFLEX MICROSCOPIC    EKG: None  Radiology: CT ANGIO HEAD NECK W WO CM Result Date: 02/03/2024 EXAM: CTA HEAD AND NECK WITHOUT AND WITH IV CONTRAST 02/03/2024 10:51:54 PM TECHNIQUE: CTA of the head and neck was performed without and with the administration of 75mL intravenous contrast (iohexol  (OMNIPAQUE ) 350 MG/ML injection 75 mL IOHEXOL  350 MG/ML SOLN). Multiplanar 2D and/or 3D reformatted images are provided for review. Automated exposure control, iterative reconstruction, and/or weight based adjustment of the mA/kV was utilized to reduce the radiation dose to as  low as reasonably achievable. Stenosis of the internal carotid arteries measured using NASCET criteria. COMPARISON: None available CLINICAL HISTORY: Stroke suspected (Ped 0-17y); right hemiparesis. FINDINGS: AORTIC ARCH AND ARCH VESSELS: No dissection or arterial injury. No significant stenosis of the brachiocephalic or subclavian arteries. CERVICAL CAROTID ARTERIES: No dissection, arterial injury, or hemodynamically significant stenosis by NASCET criteria. CERVICAL VERTEBRAL ARTERIES: No dissection, arterial injury, or significant stenosis. LUNGS AND MEDIASTINUM: Unremarkable. SOFT TISSUES: No acute abnormality. BONES: No acute abnormality. ANTERIOR CIRCULATION: No significant stenosis of the internal carotid arteries. No significant stenosis of the anterior cerebral arteries. No significant stenosis of the middle cerebral arteries. No aneurysm. POSTERIOR CIRCULATION: No significant stenosis of the posterior cerebral arteries. No significant stenosis of the basilar artery. No significant stenosis of the vertebral arteries. No aneurysm. OTHER: No dural venous sinus thrombosis on this non-dedicated study. IMPRESSION: 1. No large vessel occlusion, hemodynamically significant stenosis, or aneurysm in the head or neck. Electronically signed by: Gilmore Molt MD 02/03/2024 11:14 PM EDT RP Workstation: [id number]   CT EXTREMITY LOWER RIGHT W CONTRAST Result Date: 02/03/2024 CLINICAL DATA:  Soft tissue swelling in the right thigh EXAM: CT OF THE LOWER RIGHT EXTREMITY WITH CONTRAST TECHNIQUE: Multidetector CT imaging of the lower right extremity was performed according to the standard protocol following intravenous contrast administration. RADIATION DOSE REDUCTION: This exam was performed according to the departmental dose-optimization program which includes automated exposure control, adjustment of the mA and/or kV according to patient size and/or use of iterative reconstruction technique. CONTRAST:  75mL OMNIPAQUE   IOHEXOL  350 MG/ML SOLN COMPARISON:  None Available. FINDINGS: Bones/Joint/Cartilage Right femur is well visualized and within normal limits. No acute fracture is noted. Ligaments Suboptimally assessed by CT. Muscles and Tendons Musculature of the right thigh appears within normal limits. No focal hematoma is noted. Soft tissues Considerable subcutaneous edema is identified within the anteromedial aspect of the right thigh. Overlying skin thickening is noted consistent with cellulitis. Foci of air are noted superiorly as well as a small air-fluid collection which measures approximately 2.4 cm in greatest dimension consistent with a small abscess.  Reactive adenopathy in the right inguinal region is seen. No other soft tissue abnormality is noted. IMPRESSION: Subcutaneous abscess with significant surrounding edema skin thickening. The air-fluid collection measures approximately 2.4 cm. Electronically Signed   By: Oneil Devonshire M.D.   On: 02/03/2024 23:12   CT HEAD CODE STROKE WO CONTRAST (LKW 0-4.5h, LVO 0-24h) Result Date: 02/03/2024 EXAM: CT HEAD WITHOUT CONTRAST 02/03/2024 09:59:12 PM TECHNIQUE: CT of the head was performed without the administration of intravenous contrast. Automated exposure control, iterative reconstruction, and/or weight based adjustment of the mA/kV was utilized to reduce the radiation dose to as low as reasonably achievable. COMPARISON: None available. CLINICAL HISTORY: Neuro deficit, acute, stroke suspected. FINDINGS: BRAIN AND VENTRICLES: No acute hemorrhage. No evidence of acute infarct. No hydrocephalus. No extra-axial collection. No mass effect or midline shift. ORBITS: No acute abnormality. SINUSES: No acute abnormality. SOFT TISSUES AND SKULL: No acute soft tissue abnormality. No skull fracture. IMPRESSION: 1. No acute intracranial abnormality.  ASPECTS 10. Findings discussed with Dr. Netta Fodge via telephone at 10:17 PM Electronically signed by: Gilmore Molt MD 02/03/2024 10:19 PM  EDT RP Workstation: [id number]    {Document cardiac monitor, telemetry assessment procedure when appropriate:32947} Procedures   Medications Ordered in the ED  acetaminophen  (TYLENOL ) tablet 650 mg (650 mg Oral Given 02/03/24 2228)  iohexol  (OMNIPAQUE ) 350 MG/ML injection 75 mL (75 mLs Intravenous Contrast Given 02/03/24 2250)  cefTRIAXone (ROCEPHIN) 2 g in sodium chloride  0.9 % 100 mL IVPB (0 g Intravenous Stopped 02/03/24 2333)   CRITICAL CARE Performed by: Fairy Sermon Total critical care time: 45 minutes Critical care time was exclusive of separately billable procedures and treating other patients. Critical care was necessary to treat or prevent imminent or life-threatening deterioration. Critical care was time spent personally by me on the following activities: development of treatment plan with patient and/or surrogate as well as nursing, discussions with consultants, evaluation of patient's response to treatment, examination of patient, obtaining history from patient or surrogate, ordering and performing treatments and interventions, ordering and review of laboratory studies, ordering and review of radiographic studies, pulse oximetry and re-evaluation of patient's condition.   Patient was seen by neurology and they did not recommend any TNK.  They recommended getting an MRI in the morning and just continuing his Plavix .   CT scan of the right thigh showed a large cellulitis and small abscess. {Click here for ABCD2, HEART and other calculators REFRESH Note before signing:1}                              Medical Decision Making Amount and/or Complexity of Data Reviewed Labs: ordered. Radiology: ordered.  Risk OTC drugs.   Patient with stroke symptoms that have improved.  Neurology recommends hospitalist admission with MRI in the morning.  Patient also has a cellulitis to his right thigh with a small abscess.  {Document critical care time when appropriate  Document review of  labs and clinical decision tools ie CHADS2VASC2, etc  Document your independent review of radiology images and any outside records  Document your discussion with family members, caretakers and with consultants  Document social determinants of health affecting pt's care  Document your decision making why or why not admission, treatments were needed:32947:::1}   Final diagnoses:  None    ED Discharge Orders     None

## 2024-02-03 NOTE — ED Triage Notes (Signed)
 Pt states that he started feeling light headed around 2 hours ago. He endorses that his face started twitching and his right side went numb. He feels weaker on the right side, new worsening loss of sensation to the right side, weaker grip to the right side. Slight drift of the right arm. MD at bedside.

## 2024-02-03 NOTE — Consult Note (Addendum)
 Addendum: CTA H/N Completed. 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 LKW 1930. Symptoms of right hemiparesis and numbness, worse from baseline. Symptoms in setting of frank blood in urine as well as fever and leukocytosis. NIHSS = 3. NCCT Head without acute ischemia or hemorrhage. CTA Head and Neck are pending. Overall, higher suspicion of stroke recrudescence phenomenon, but recurrent ischemic stroke has not yet been ruled out. TNK deferred due to recent GU bleeding    Recommendations:  -q4 vitals/neurochecks  -BP goal permissive up to 220/112mmHg for 24 hours followed by gradual reduction in BP toward goal normotension avoiding drop in BP >15% daily  -Blood glucose goal 180mg /dL while admitted  -Continue home Plavix  for now if deemed appropriate from GU bleeding perspective, but I would not add any additional antiplatelets at this time  -Atorvastatin  80mg  qHS  -Check LDL and A1c (goal LDL <70, goal A1c <7)  -Obtain CTA Head and Neck w/ contrast, MRI Brain w/o contrast, TTE w/ bubble, and place on telemetry monitoring  -Treat underlying infectious and/or metabolic derangements  -PT/OT/ST consults, NPO until passes bedside swallow study  -Neurology follow up recommended    ------------------------------------------------------------------------------  Advanced Imaging: Advanced imaging has been ordered. Results pending.   Metrics: Last Known Well: 02/03/2024 19:30:00 Dispatch Time: 02/03/2024 22:01:33 Arrival Time: 02/03/2024  21:36:00 Initial Response Time: 02/03/2024 77:93:77 Symptoms: Recurrent right hemiparesis, numbness. Initial patient interaction: 02/03/2024 22:07:00 NIHSS Assessment Completed: 02/03/2024 22:10:34 Patient is not a candidate for Thrombolytic. Thrombolytic Medical Decision: 02/03/2024 22:10:35 Patient was not deemed candidate for Thrombolytic because of following reasons: Recent gastrointestinal or urinary tract hemorrhage (within previous 21 days) .  CT Head: I personally reviewed all the CT images that were available to me and it showed: No hemorrhage or obvious acute ischemia  Primary Provider Notified of Diagnostic Impression and Management Plan on: 02/03/2024 22:31:10    ------------------------------------------------------------------------------  History of Present Illness: Patient is a 48 year old Male.  Patient was brought by private transportation with symptoms of Recurrent right hemiparesis, numbness. 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 Symptom onset 2 hours ago. Around this same time he also noted blood in his urine while using the toilet. His symptoms of weakness and numbness persisted so he presented to ED for further evaluation. He denies any preceding illness. Ne notes these symptoms feel the same as when he had his most recent stroke in 2022   Past Medical History:      Hypertension      Diabetes Mellitus      Hyperlipidemia      Stroke  Medications:  No Anticoagulant use  Antiplatelet use: Yes Plavix  Reviewed EMR for current medications  Allergies:  Reviewed  Social History: Drug Use: No  Family History:  There is no family history of premature cerebrovascular disease pertinent to this consultation  ROS :  14 Points Review of Systems was performed and was negative except mentioned in  HPI.  Past Surgical History: There Is No Surgical History Contributory To Today's Visit    Examination: BP(201/97), Pulse(106), 1A: Level of Consciousness - Alert; keenly responsive + 0 1B: Ask Month and Age - Both Questions Right + 0 1C: Blink Eyes & Squeeze Hands - Performs Both Tasks + 0 2: Test Horizontal Extraocular Movements - Normal + 0 3: Test Visual Fields - No Visual Loss + 0 4: Test Facial Palsy (Use Grimace if Obtunded) - Normal symmetry + 0 5A: Test Left Arm Motor Drift - No Drift for 10 Seconds + 0 5B: Test Right Arm Motor Drift - Drift, but doesn't hit bed + 1 6A: Test Left Leg Motor Drift - No Drift for 5 Seconds + 0 6B: Test Right Leg Motor Drift - Drift, but doesn't hit bed + 1 7: Test Limb Ataxia (FNF/Heel-Shin) - No Ataxia + 0 8: Test Sensation - Mild-Moderate Loss: Less Sharp/More Dull + 1 9: Test Language/Aphasia - Normal; No aphasia + 0 10: Test Dysarthria - Normal + 0 11: Test Extinction/Inattention - No abnormality + 0  NIHSS Score: 3   Pre-Morbid Modified Rankin Scale: 2 Points = Slight disability; unable to carry out all previous activities, but able to look after own affairs without assistance  Spoke with : ED Attending MD I reviewed the available imaging via Rapid and initiated discussion with the primary provider  This consult was conducted in real time using interactive audio and Immunologist. Patient was informed of the technology being used for this visit and agreed to proceed. Patient located in hospital and provider located at home/office setting.   Patient is being evaluated for possible acute neurologic impairment and high probability of imminent or life-threatening deterioration. I spent total of 35 minutes providing care to this patient, including time for face to face visit via telemedicine, review of medical records, imaging studies and discussion of findings with providers, the patient and/or family.    Dr Aloha Coppersmith   [facility] For Inpatient follow-up with [facility] physician please call [facility] at [phone]. As we are not an outpatient service for any post hospital discharge needs please contact the hospital for assistance. If you have any questions for the [facility] physicians or need to reconsult for clinical or diagnostic changes please contact us  via [facility] at [phone].   Signature : Aloha Coppersmith

## 2024-02-04 ENCOUNTER — Observation Stay (HOSPITAL_COMMUNITY)

## 2024-02-04 ENCOUNTER — Encounter (HOSPITAL_COMMUNITY): Payer: Self-pay

## 2024-02-04 DIAGNOSIS — D649 Anemia, unspecified: Secondary | ICD-10-CM | POA: Diagnosis not present

## 2024-02-04 DIAGNOSIS — Z743 Need for continuous supervision: Secondary | ICD-10-CM | POA: Diagnosis not present

## 2024-02-04 DIAGNOSIS — I69351 Hemiplegia and hemiparesis following cerebral infarction affecting right dominant side: Secondary | ICD-10-CM | POA: Diagnosis not present

## 2024-02-04 DIAGNOSIS — E876 Hypokalemia: Secondary | ICD-10-CM | POA: Diagnosis not present

## 2024-02-04 DIAGNOSIS — G4730 Sleep apnea, unspecified: Secondary | ICD-10-CM | POA: Diagnosis present

## 2024-02-04 DIAGNOSIS — Z7902 Long term (current) use of antithrombotics/antiplatelets: Secondary | ICD-10-CM | POA: Diagnosis not present

## 2024-02-04 DIAGNOSIS — R531 Weakness: Secondary | ICD-10-CM

## 2024-02-04 DIAGNOSIS — A419 Sepsis, unspecified organism: Secondary | ICD-10-CM

## 2024-02-04 DIAGNOSIS — K219 Gastro-esophageal reflux disease without esophagitis: Secondary | ICD-10-CM | POA: Diagnosis present

## 2024-02-04 DIAGNOSIS — Z7984 Long term (current) use of oral hypoglycemic drugs: Secondary | ICD-10-CM | POA: Diagnosis not present

## 2024-02-04 DIAGNOSIS — F32A Depression, unspecified: Secondary | ICD-10-CM | POA: Diagnosis present

## 2024-02-04 DIAGNOSIS — E785 Hyperlipidemia, unspecified: Secondary | ICD-10-CM | POA: Diagnosis present

## 2024-02-04 DIAGNOSIS — L02214 Cutaneous abscess of groin: Secondary | ICD-10-CM | POA: Diagnosis not present

## 2024-02-04 DIAGNOSIS — L02425 Furuncle of right lower limb: Secondary | ICD-10-CM | POA: Diagnosis present

## 2024-02-04 DIAGNOSIS — Z79899 Other long term (current) drug therapy: Secondary | ICD-10-CM | POA: Diagnosis not present

## 2024-02-04 DIAGNOSIS — L02415 Cutaneous abscess of right lower limb: Secondary | ICD-10-CM

## 2024-02-04 DIAGNOSIS — F418 Other specified anxiety disorders: Secondary | ICD-10-CM | POA: Diagnosis not present

## 2024-02-04 DIAGNOSIS — R2990 Unspecified symptoms and signs involving the nervous system: Secondary | ICD-10-CM | POA: Diagnosis not present

## 2024-02-04 DIAGNOSIS — I2699 Other pulmonary embolism without acute cor pulmonale: Secondary | ICD-10-CM

## 2024-02-04 DIAGNOSIS — Z992 Dependence on renal dialysis: Secondary | ICD-10-CM | POA: Diagnosis not present

## 2024-02-04 DIAGNOSIS — L0390 Cellulitis, unspecified: Secondary | ICD-10-CM | POA: Diagnosis not present

## 2024-02-04 DIAGNOSIS — E1165 Type 2 diabetes mellitus with hyperglycemia: Secondary | ICD-10-CM | POA: Diagnosis not present

## 2024-02-04 DIAGNOSIS — L02413 Cutaneous abscess of right upper limb: Secondary | ICD-10-CM | POA: Diagnosis not present

## 2024-02-04 DIAGNOSIS — Z794 Long term (current) use of insulin: Secondary | ICD-10-CM | POA: Diagnosis not present

## 2024-02-04 DIAGNOSIS — L03115 Cellulitis of right lower limb: Secondary | ICD-10-CM | POA: Diagnosis present

## 2024-02-04 DIAGNOSIS — E66813 Obesity, class 3: Secondary | ICD-10-CM | POA: Diagnosis present

## 2024-02-04 DIAGNOSIS — G4733 Obstructive sleep apnea (adult) (pediatric): Secondary | ICD-10-CM | POA: Diagnosis present

## 2024-02-04 DIAGNOSIS — Z1152 Encounter for screening for COVID-19: Secondary | ICD-10-CM | POA: Diagnosis not present

## 2024-02-04 DIAGNOSIS — N179 Acute kidney failure, unspecified: Secondary | ICD-10-CM | POA: Diagnosis present

## 2024-02-04 DIAGNOSIS — I10 Essential (primary) hypertension: Secondary | ICD-10-CM | POA: Diagnosis not present

## 2024-02-04 DIAGNOSIS — Z6841 Body Mass Index (BMI) 40.0 and over, adult: Secondary | ICD-10-CM | POA: Diagnosis not present

## 2024-02-04 DIAGNOSIS — D75839 Thrombocytosis, unspecified: Secondary | ICD-10-CM | POA: Diagnosis present

## 2024-02-04 DIAGNOSIS — E871 Hypo-osmolality and hyponatremia: Secondary | ICD-10-CM | POA: Diagnosis present

## 2024-02-04 DIAGNOSIS — E119 Type 2 diabetes mellitus without complications: Secondary | ICD-10-CM

## 2024-02-04 LAB — CBC
HCT: 38 % — ABNORMAL LOW (ref 39.0–52.0)
Hemoglobin: 12.3 g/dL — ABNORMAL LOW (ref 13.0–17.0)
MCH: 28.9 pg (ref 26.0–34.0)
MCHC: 32.4 g/dL (ref 30.0–36.0)
MCV: 89.4 fL (ref 80.0–100.0)
Platelets: 278 K/uL (ref 150–400)
RBC: 4.25 MIL/uL (ref 4.22–5.81)
RDW: 11.9 % (ref 11.5–15.5)
WBC: 16.5 K/uL — ABNORMAL HIGH (ref 4.0–10.5)
nRBC: 0 % (ref 0.0–0.2)

## 2024-02-04 LAB — HEMOGLOBIN A1C
Hgb A1c MFr Bld: 7.2 % — ABNORMAL HIGH (ref 4.8–5.6)
Mean Plasma Glucose: 159.94 mg/dL

## 2024-02-04 LAB — URINE DRUG SCREEN
Amphetamines: NEGATIVE
Barbiturates: NEGATIVE
Benzodiazepines: NEGATIVE
Cocaine: NEGATIVE
Fentanyl: NEGATIVE
Methadone Scn, Ur: NEGATIVE
Opiates: NEGATIVE
Tetrahydrocannabinol: NEGATIVE

## 2024-02-04 LAB — COMPREHENSIVE METABOLIC PANEL WITH GFR
ALT: 15 U/L (ref 0–44)
AST: 21 U/L (ref 15–41)
Albumin: 3.7 g/dL (ref 3.5–5.0)
Alkaline Phosphatase: 75 U/L (ref 38–126)
Anion gap: 9 (ref 5–15)
BUN: 12 mg/dL (ref 6–20)
CO2: 26 mmol/L (ref 22–32)
Calcium: 9.3 mg/dL (ref 8.9–10.3)
Chloride: 100 mmol/L (ref 98–111)
Creatinine, Ser: 0.8 mg/dL (ref 0.61–1.24)
GFR, Estimated: >60 mL/min (ref >60)
Glucose, Bld: 321 mg/dL — ABNORMAL HIGH (ref 70–99)
Potassium: 3.6 mmol/L (ref 3.5–5.1)
Sodium: 135 mmol/L (ref 135–145)
Total Bilirubin: 0.9 mg/dL (ref 0.0–1.2)
Total Protein: 6.8 g/dL (ref 6.5–8.1)

## 2024-02-04 LAB — URINALYSIS, ROUTINE W REFLEX MICROSCOPIC
Bacteria, UA: NONE SEEN
Bilirubin Urine: NEGATIVE
Glucose, UA: >=500 mg/dL — AB
Ketones, ur: NEGATIVE mg/dL
Leukocytes,Ua: NEGATIVE
Nitrite: NEGATIVE
Protein, ur: 30 mg/dL — AB
Specific Gravity, Urine: <1.005 — ABNORMAL LOW (ref 1.005–1.030)
pH: 5 (ref 5.0–8.0)

## 2024-02-04 LAB — LIPID PANEL
Cholesterol: 154 mg/dL (ref 0–200)
HDL: 67 mg/dL (ref >40)
LDL Cholesterol: 73 mg/dL (ref 0–99)
Total CHOL/HDL Ratio: 2.3 ratio
Triglycerides: 73 mg/dL (ref <150)
VLDL: 15 mg/dL (ref 0–40)

## 2024-02-04 LAB — LACTIC ACID, PLASMA: Lactic Acid, Venous: 1 mmol/L (ref 0.5–1.9)

## 2024-02-04 LAB — ECHOCARDIOGRAM COMPLETE BUBBLE STUDY
AR max vel: 3.66 cm2
AV Peak grad: 7.4 mmHg
Ao pk vel: 1.36 m/s
Area-P 1/2: 3.99 cm2
Calc EF: 64.8 %
S' Lateral: 3.4 cm
Single Plane A2C EF: 61.3 %
Single Plane A4C EF: 67.6 %

## 2024-02-04 LAB — CBG MONITORING, ED
Glucose-Capillary: 292 mg/dL — ABNORMAL HIGH (ref 70–99)
Glucose-Capillary: 261 mg/dL — ABNORMAL HIGH (ref 70–99)
Glucose-Capillary: 210 mg/dL — ABNORMAL HIGH (ref 70–99)
Glucose-Capillary: 196 mg/dL — ABNORMAL HIGH (ref 70–99)

## 2024-02-04 LAB — HIV ANTIBODY (ROUTINE TESTING W REFLEX): HIV Screen 4th Generation wRfx: NONREACTIVE

## 2024-02-04 MED ORDER — ATORVASTATIN CALCIUM 80 MG PO TABS
80.0000 mg | ORAL_TABLET | Freq: Every evening | ORAL | Status: DC
  Administered 2024-02-04 – 2024-02-11 (×8): 80 mg via ORAL
  Filled 2024-02-04 (×5): qty 1
  Filled 2024-02-04: qty 2
  Filled 2024-02-04 (×2): qty 1

## 2024-02-04 MED ORDER — SERTRALINE HCL 100 MG PO TABS
100.0000 mg | ORAL_TABLET | Freq: Every day | ORAL | Status: DC
  Administered 2024-02-04 – 2024-02-12 (×9): 100 mg via ORAL
  Filled 2024-02-04 (×4): qty 1
  Filled 2024-02-04: qty 2
  Filled 2024-02-04 (×4): qty 1

## 2024-02-04 MED ORDER — VANCOMYCIN HCL IN DEXTROSE 1-5 GM/200ML-% IV SOLN
1000.0000 mg | Freq: Once | INTRAVENOUS | Status: AC
Start: 2024-02-04 — End: 2024-02-04
  Administered 2024-02-04: 1000 mg via INTRAVENOUS
  Filled 2024-02-04: qty 200

## 2024-02-04 MED ORDER — ACETAMINOPHEN 325 MG PO TABS
650.0000 mg | ORAL_TABLET | Freq: Four times a day (QID) | ORAL | Status: DC | PRN
  Administered 2024-02-04 – 2024-02-05 (×4): 650 mg via ORAL
  Filled 2024-02-04 (×4): qty 2

## 2024-02-04 MED ORDER — PANTOPRAZOLE SODIUM 40 MG PO TBEC
40.0000 mg | DELAYED_RELEASE_TABLET | Freq: Every day | ORAL | Status: DC
Start: 2024-02-04 — End: 2024-02-12
  Administered 2024-02-04 – 2024-02-12 (×9): 40 mg via ORAL
  Filled 2024-02-04: qty 2
  Filled 2024-02-04 (×6): qty 1
  Filled 2024-02-04: qty 2
  Filled 2024-02-04: qty 1

## 2024-02-04 MED ORDER — CLOPIDOGREL BISULFATE 75 MG PO TABS: 75.0000 mg | ORAL_TABLET | Freq: Every day | ORAL | Status: DC

## 2024-02-04 MED ORDER — LORAZEPAM 2 MG/ML IJ SOLN
0.5000 mg | Freq: Once | INTRAMUSCULAR | Status: AC
  Administered 2024-02-04: 0.5 mg via INTRAVENOUS
  Filled 2024-02-04: qty 1

## 2024-02-04 MED ORDER — VANCOMYCIN HCL 1250 MG/250ML IV SOLN
1250.0000 mg | Freq: Two times a day (BID) | INTRAVENOUS | Status: DC
  Administered 2024-02-04 – 2024-02-06 (×4): 1250 mg via INTRAVENOUS
  Filled 2024-02-04 (×7): qty 250

## 2024-02-04 MED ORDER — INSULIN ASPART 100 UNIT/ML IJ SOLN
0.0000 [IU] | INTRAMUSCULAR | Status: DC
  Administered 2024-02-04: 5 [IU] via SUBCUTANEOUS
  Administered 2024-02-04: 3 [IU] via SUBCUTANEOUS
  Administered 2024-02-04: 5 [IU] via SUBCUTANEOUS
  Administered 2024-02-04 – 2024-02-05 (×2): 3 [IU] via SUBCUTANEOUS
  Administered 2024-02-05: 5 [IU] via SUBCUTANEOUS
  Administered 2024-02-05: 3 [IU] via SUBCUTANEOUS
  Administered 2024-02-05: 5 [IU] via SUBCUTANEOUS
  Administered 2024-02-05 (×2): 3 [IU] via SUBCUTANEOUS
  Administered 2024-02-06 (×2): 5 [IU] via SUBCUTANEOUS
  Filled 2024-02-04: qty 0.09

## 2024-02-04 MED ORDER — INSULIN ASPART 100 UNIT/ML IJ SOLN: 0.0000 [IU] | Freq: Every day | INTRAMUSCULAR | Status: DC

## 2024-02-04 MED ORDER — CLOPIDOGREL BISULFATE 75 MG PO TABS
75.0000 mg | ORAL_TABLET | Freq: Every day | ORAL | Status: DC
  Administered 2024-02-04: 75 mg via ORAL
  Filled 2024-02-04: qty 1

## 2024-02-04 MED ORDER — SODIUM CHLORIDE 0.9 % IV SOLN
2.0000 g | INTRAVENOUS | Status: DC
  Administered 2024-02-04 – 2024-02-08 (×5): 2 g via INTRAVENOUS
  Filled 2024-02-04 (×5): qty 20

## 2024-02-04 MED ORDER — EZETIMIBE 10 MG PO TABS
10.0000 mg | ORAL_TABLET | Freq: Every evening | ORAL | Status: DC
  Administered 2024-02-04 – 2024-02-11 (×8): 10 mg via ORAL
  Filled 2024-02-04 (×8): qty 1

## 2024-02-04 MED ORDER — TRAZODONE HCL 50 MG PO TABS
50.0000 mg | ORAL_TABLET | Freq: Every day | ORAL | Status: DC
  Administered 2024-02-04 – 2024-02-11 (×8): 50 mg via ORAL
  Filled 2024-02-04 (×8): qty 1

## 2024-02-04 MED ORDER — STROKE: EARLY STAGES OF RECOVERY BOOK
Freq: Once | Status: AC
  Filled 2024-02-04: qty 1

## 2024-02-04 MED ORDER — LOSARTAN POTASSIUM 50 MG PO TABS
100.0000 mg | ORAL_TABLET | Freq: Every day | ORAL | Status: DC
  Administered 2024-02-04 – 2024-02-05 (×2): 100 mg via ORAL
  Filled 2024-02-04 (×2): qty 2

## 2024-02-04 MED ORDER — INSULIN ASPART 100 UNIT/ML IJ SOLN: 0.0000 [IU] | Freq: Three times a day (TID) | INTRAMUSCULAR | Status: DC

## 2024-02-04 MED ORDER — NIFEDIPINE ER OSMOTIC RELEASE 30 MG PO TB24
90.0000 mg | ORAL_TABLET | Freq: Every day | ORAL | Status: DC
  Administered 2024-02-05 – 2024-02-12 (×8): 90 mg via ORAL
  Filled 2024-02-04 (×8): qty 3

## 2024-02-04 MED ORDER — HYDRALAZINE HCL 50 MG PO TABS
50.0000 mg | ORAL_TABLET | Freq: Two times a day (BID) | ORAL | Status: DC
  Administered 2024-02-04 – 2024-02-12 (×17): 50 mg via ORAL
  Filled 2024-02-04 (×15): qty 1
  Filled 2024-02-04: qty 2
  Filled 2024-02-04: qty 1

## 2024-02-04 NOTE — H&P (Signed)
 History and Physical    Christopher Roberson FMW:[id number] DOB: 1975-06-30 DOA: 02/03/2024  PCP: [facility] [address]  Patient coming from: Home  Chief Complaint: Right-sided weakness/numbness  HPI: Christopher Roberson is a 48 y.o. 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  LKW 1930.  CT head showing no acute intracranial abnormality.  CTA head and neck negative for LVO. Seen by teleneurology and they are suspecting stroke recrudescence phenomenon versus recurrent ischemic stroke.  Recommend admission for stroke workup.  Patient presented to the ED with fever of 101.1 F and heart rate of 106.  Blood pressure initially elevated to 201/97.  Patient noted to have cellulitis of his right upper leg/inner thigh.  Labs notable for WBC count 17.4, glucose 259, T. bili 1.5, transaminases and alkaline phosphatase normal, ethanol level <15, COVID/influenza/RSV PCR negative, UDS negative, UA not suggestive of infection.  CT of right lower extremity showing findings consistent with cellulitis and a small subcutaneous abscess measuring 2.4 cm. Patient was given Tylenol , vancomycin, and ceftriaxone.  TR[facility] called to admit.  Patient reports history of 2 previous strokes which left him with mild right-sided weakness.  Tonight around 7 PM he had worsening right upper and lower extremity weakness and numbness.  Symptoms have now improved.  He is also complaining of right upper thigh redness, swelling, and pain which has gotten progressively worse over the past 3 days and now having fevers.  He has not noticed any pus drainage from this area.  Patient states just before he came into the emergency room, his urine was dark orange in color.  Denies any pain with urination.  Denies abdominal pain, nausea, or vomiting.  Denies  cough, shortness of breath, or chest pain.  Review of Systems:  Review of Systems  All other systems reviewed and are negative.   Past Medical History:  Diagnosis Date   Depression    Diabetes mellitus    Hypertension    Obesity    Sleep apnea    On CPAP machine   Stroke [facility])     Past Surgical History:  Procedure Laterality Date   APPENDECTOMY     CARPAL TUNNEL RELEASE     ESOPHAGOGASTRODUODENOSCOPY     [facility] around 2015-2016   HERNIA REPAIR       reports that he has never smoked. He has never used smokeless tobacco. He reports that he does not drink alcohol and does not use drugs.  Allergies  Allergen Reactions   Percocet [Oxycodone-Acetaminophen ] Itching    Can take if takes with Benadryl     Family History  Problem Relation Age of Onset   Heart disease Mother    Colon cancer Father    Cancer Paternal Grandmother    Prostate cancer Paternal Grandfather    Colon cancer Paternal Uncle    Prostate cancer Paternal Uncle    Prostate cancer Paternal Uncle    Esophageal cancer Neg Hx    Rectal cancer Neg Hx    Stomach cancer Neg Hx     Prior to Admission medications   Medication Sig Start Date End Date Taking? Authorizing Provider  atorvastatin  (LIPITOR ) 80 MG tablet Take 80 mg by mouth every evening.   Yes [provider]  clopidogrel  (PLAVIX ) 75 MG tablet Take 1 tablet (75 mg total) by mouth daily. 05/01/21  Yes Krishnan, Gokul, MD  dapagliflozin propanediol (FARXIGA) 10 MG TABS tablet Take 10 mg by mouth daily. 08/13/20  Yes [provider]  ezetimibe (ZETIA) 10 MG tablet Take 10 mg by mouth every evening.   Yes [provider]  hydrALAZINE  (APRESOLINE ) 50 MG tablet Take 50 mg by mouth 2 (two) times daily. 01/23/24  Yes [provider]  losartan  (COZAAR ) 100 MG tablet Take 100 mg by mouth daily. 05/31/21  Yes [provider]  metFORMIN  (GLUCOPHAGE ) 1000 MG tablet Take 1 tablet (1,000 mg total) by mouth 2 (two)  times daily with a meal. 04/30/21  Yes Verdene Purchase, MD  NIFEdipine (PROCARDIA XL/NIFEDICAL-XL) 90 MG 24 hr tablet Take 90 mg by mouth daily. 01/31/24  Yes [provider]  nitroGLYCERIN (NITROSTAT) 0.4 MG SL tablet Place 0.4 mg under the tongue every 5 (five) minutes as needed. 01/23/24  Yes [provider]  pantoprazole  (PROTONIX ) 40 MG tablet Take 40 mg by mouth daily.   Yes [provider]  sertraline  (ZOLOFT ) 100 MG tablet Take 100 mg by mouth daily.   Yes [provider]  spironolactone  (ALDACTONE ) 25 MG tablet Take 25 mg by mouth daily. 06/24/20  Yes [provider]  tadalafil (CIALIS) 20 MG tablet Take 20 mg by mouth daily as needed.   Yes [provider]  traZODone  (DESYREL ) 50 MG tablet Take 50 mg by mouth at bedtime.   Yes [provider]  TRULICITY  0.75 MG/0.5ML SOAJ Inject 0.75 mg into the skin every Monday. 01/23/24  Yes [provider]  Vitamin D, Ergocalciferol, (DRISDOL) 1.25 MG (50000 UNIT) CAPS capsule Take 50,000 Units by mouth once a week.   Yes [provider]  atorvastatin  (LIPITOR ) 10 MG tablet Take 1 tablet (10 mg total) by mouth daily. Patient not taking: Reported on 02/03/2024 06/28/23   Motwani, Komal, MD  blood glucose meter kit and supplies KIT Dispense based on patient and insurance preference. Use up to four times daily as directed. 04/30/21   Krishnan, Gokul, MD  Blood Glucose Monitoring Suppl DEVI 1 each by Does not apply route in the morning, at noon, and at bedtime. May substitute to any manufacturer covered by patient's insurance. 06/28/23   Motwani, Komal, MD  Continuous Glucose Sensor (DEXCOM G7 SENSOR) MISC Change sensor every 3 days. 07/26/23   Motwani, Komal, MD  insulin  aspart (NOVOLOG  FLEXPEN) 100 UNIT/ML FlexPen Correction scale: max dose 40 units/day Patient not taking: Reported on 02/04/2024 07/26/23   Motwani, Komal, MD  Insulin  Pen Needle (PEN NEEDLES) 32G X 4 MM MISC Use as directed  in the morning, at noon, in the evening, and at bedtime. 07/26/23   Motwani, Komal, MD  Insulin  Pen Needle 32G X 4 MM MISC Use as directed 04/30/21   Krishnan, Gokul, MD  methocarbamol  (ROBAXIN ) 500 MG tablet Take 1 tablet (500 mg total) by mouth at bedtime as needed for muscle spasms. Patient not taking: Reported on 02/04/2024 06/25/21   Blaise Aleene KIDD, MD  pioglitazone  (ACTOS ) 30 MG tablet Take 1 tablet (30 mg total) by mouth daily. 07/26/23 08/25/23  Motwani, Komal, MD  traZODone  (DESYREL ) 100 MG tablet Take 100-300 mg by mouth at bedtime as needed for sleep. 07/26/18   [provider]    Physical Exam: Vitals:   02/04/24 0100 02/04/24 0115 02/04/24 0143 02/04/24 0200  BP: (!) 187/81 (!) 181/94 (!) 189/94 (!) 156/85  Pulse: 89 89 87 80  Resp: (!) 22 (!) 29 (!) 27 (!) 22  Temp:   98.5  F (36.9 C)   TempSrc:      SpO2: 95% 95% 95% 99%    Physical Exam Vitals reviewed. Exam conducted with a chaperone present (Patient's ED RN Caron).  Constitutional:      General: He is not in acute distress. HENT:     Head: Normocephalic and atraumatic.  Eyes:     Extraocular Movements: Extraocular movements intact.  Cardiovascular:     Rate and Rhythm: Normal rate and regular rhythm.     Heart sounds: Normal heart sounds.  Pulmonary:     Effort: Pulmonary effort is normal. No respiratory distress.     Breath sounds: Normal breath sounds.  Abdominal:     General: Bowel sounds are normal.     Palpations: Abdomen is soft.     Tenderness: There is no abdominal tenderness. There is no guarding.  Musculoskeletal:     Cervical back: Normal range of motion.     Right lower leg: No edema.     Left lower leg: No edema.  Skin:    General: Skin is warm and dry.     Comments: Right upper/inner thigh with large area of induration which is tender to palpation.  No obvious drainage.  Large area of surrounding erythema.  Neurological:     General: No focal deficit present.     Mental Status: He is  alert and oriented to person, place, and time.     Cranial Nerves: No cranial nerve deficit.     Sensory: No sensory deficit.     Motor: No weakness.     Comments: Strength 5 out of 5 in bilateral upper and lower extremities.  Sensation to light touch intact throughout.     Labs on Admission: I have personally reviewed following labs and imaging studies  CBC: Recent Labs  Lab 02/03/24 2155 02/03/24 2222  WBC 17.4*  --   NEUTROABS 14.5*  --   HGB 13.0 13.3  HCT 40.1 39.0  MCV 89.7  --   PLT 309  --    Basic Metabolic Panel: Recent Labs  Lab 02/03/24 2155 02/03/24 2222  NA 137 136  K 3.7 3.5  CL 97* 100  CO2 29  --   GLUCOSE 259* 278*  BUN 12 12  CREATININE 1.05 0.90  CALCIUM  10.0  --    GFR: CrCl cannot be calculated (Unknown ideal weight.). Liver Function Tests: Recent Labs  Lab 02/03/24 2155  AST 19  ALT 15  ALKPHOS 78  BILITOT 1.5*  PROT 8.2*  ALBUMIN 4.3   No results for input(s): LIPASE, AMYLASE in the last 168 hours. No results for input(s): AMMONIA in the last 168 hours. Coagulation Profile: Recent Labs  Lab 02/03/24 2155  INR 1.1   Cardiac Enzymes: No results for input(s): CKTOTAL, CKMB, CKMBINDEX, TROPONINI in the last 168 hours. BNP (last 3 results) No results for input(s): PROBNP in the last 8760 hours. HbA1C: No results for input(s): HGBA1C in the last 72 hours. CBG: No results for input(s): GLUCAP in the last 168 hours. Lipid Profile: No results for input(s): CHOL, HDL, LDLCALC, TRIG, CHOLHDL, LDLDIRECT in the last 72 hours. Thyroid Function Tests: No results for input(s): TSH, T4TOTAL, FREET4, T3FREE, THYROIDAB in the last 72 hours. Anemia Panel: No results for input(s): VITAMINB12, FOLATE, FERRITIN, TIBC, IRON, RETICCTPCT in the last 72 hours. Urine analysis:    Component Value Date/Time   COLORURINE YELLOW 02/04/2024 0025   APPEARANCEUR CLEAR 02/04/2024 0025   LABSPEC  <1.005 (L) 02/04/2024  0025   PHURINE 5.0 02/04/2024 0025   GLUCOSEU >=500 (A) 02/04/2024 0025   HGBUR SMALL (A) 02/04/2024 0025   BILIRUBINUR NEGATIVE 02/04/2024 0025   KETONESUR NEGATIVE 02/04/2024 0025   PROTEINUR 30 (A) 02/04/2024 0025   UROBILINOGEN 1.0 01/05/2012 1232   NITRITE NEGATIVE 02/04/2024 0025   LEUKOCYTESUR NEGATIVE 02/04/2024 0025    Radiological Exams on Admission: CT ANGIO HEAD NECK W WO CM Result Date: 02/03/2024 EXAM: CTA HEAD AND NECK WITHOUT AND WITH IV CONTRAST 02/03/2024 10:51:54 PM TECHNIQUE: CTA of the head and neck was performed without and with the administration of 75mL intravenous contrast (iohexol  (OMNIPAQUE ) 350 MG/ML injection 75 mL IOHEXOL  350 MG/ML SOLN). Multiplanar 2D and/or 3D reformatted images are provided for review. Automated exposure control, iterative reconstruction, and/or weight based adjustment of the mA/kV was utilized to reduce the radiation dose to as low as reasonably achievable. Stenosis of the internal carotid arteries measured using NASCET criteria. COMPARISON: None available CLINICAL HISTORY: Stroke suspected (Ped 0-17y); right hemiparesis. FINDINGS: AORTIC ARCH AND ARCH VESSELS: No dissection or arterial injury. No significant stenosis of the brachiocephalic or subclavian arteries. CERVICAL CAROTID ARTERIES: No dissection, arterial injury, or hemodynamically significant stenosis by NASCET criteria. CERVICAL VERTEBRAL ARTERIES: No dissection, arterial injury, or significant stenosis. LUNGS AND MEDIASTINUM: Unremarkable. SOFT TISSUES: No acute abnormality. BONES: No acute abnormality. ANTERIOR CIRCULATION: No significant stenosis of the internal carotid arteries. No significant stenosis of the anterior cerebral arteries. No significant stenosis of the middle cerebral arteries. No aneurysm. POSTERIOR CIRCULATION: No significant stenosis of the posterior cerebral arteries. No significant stenosis of the basilar artery. No significant stenosis of the  vertebral arteries. No aneurysm. OTHER: No dural venous sinus thrombosis on this non-dedicated study. IMPRESSION: 1. No large vessel occlusion, hemodynamically significant stenosis, or aneurysm in the head or neck. Electronically signed by: Gilmore Molt MD 02/03/2024 11:14 PM EDT RP Workstation: [id number]   CT EXTREMITY LOWER RIGHT W CONTRAST Result Date: 02/03/2024 CLINICAL DATA:  Soft tissue swelling in the right thigh EXAM: CT OF THE LOWER RIGHT EXTREMITY WITH CONTRAST TECHNIQUE: Multidetector CT imaging of the lower right extremity was performed according to the standard protocol following intravenous contrast administration. RADIATION DOSE REDUCTION: This exam was performed according to the departmental dose-optimization program which includes automated exposure control, adjustment of the mA and/or kV according to patient size and/or use of iterative reconstruction technique. CONTRAST:  75mL OMNIPAQUE  IOHEXOL  350 MG/ML SOLN COMPARISON:  None Available. FINDINGS: Bones/Joint/Cartilage Right femur is well visualized and within normal limits. No acute fracture is noted. Ligaments Suboptimally assessed by CT. Muscles and Tendons Musculature of the right thigh appears within normal limits. No focal hematoma is noted. Soft tissues Considerable subcutaneous edema is identified within the anteromedial aspect of the right thigh. Overlying skin thickening is noted consistent with cellulitis. Foci of air are noted superiorly as well as a small air-fluid collection which measures approximately 2.4 cm in greatest dimension consistent with a small abscess. Reactive adenopathy in the right inguinal region is seen. No other soft tissue abnormality is noted. IMPRESSION: Subcutaneous abscess with significant surrounding edema skin thickening. The air-fluid collection measures approximately 2.4 cm. Electronically Signed   By: Oneil Devonshire M.D.   On: 02/03/2024 23:12   CT HEAD CODE STROKE WO CONTRAST (LKW 0-4.5h, LVO  0-24h) Result Date: 02/03/2024 EXAM: CT HEAD WITHOUT CONTRAST 02/03/2024 09:59:12 PM TECHNIQUE: CT of the head was performed without the administration of intravenous contrast. Automated exposure control, iterative reconstruction, and/or weight based adjustment of  the mA/kV was utilized to reduce the radiation dose to as low as reasonably achievable. COMPARISON: None available. CLINICAL HISTORY: Neuro deficit, acute, stroke suspected. FINDINGS: BRAIN AND VENTRICLES: No acute hemorrhage. No evidence of acute infarct. No hydrocephalus. No extra-axial collection. No mass effect or midline shift. ORBITS: No acute abnormality. SINUSES: No acute abnormality. SOFT TISSUES AND SKULL: No acute soft tissue abnormality. No skull fracture. IMPRESSION: 1. No acute intracranial abnormality.  ASPECTS 10. Findings discussed with Dr. Zammit via telephone at 10:17 PM Electronically signed by: Gilmore Molt MD 02/03/2024 10:19 PM EDT RP Workstation: [id number]    EKG: Independently reviewed.  Sinus rhythm, no significant change since previous tracing.  Assessment and Plan  Right-sided weakness/numbness Patient with history of previous stroke x 2 with residual right-sided weakness.  He is presenting with worsening right-sided weakness/numbness from baseline.  LKW 1930.  Symptoms have now improved.  CT head showing no acute intracranial abnormality.  CTA head and neck negative for LVO. Seen by teleneurology and they are suspecting stroke recrudescence phenomenon versus recurrent ischemic stroke. -Admit to [facility] for stroke workup -It seems patient told teleneurology that he was having hematuria.  He had just urinated prior to my arrival and urine sample at bedside is not showing gross hematuria and this has also been confirmed with UA.  It should be okay to continue clopidogrel  except he does have an abscess of his upper inner thigh which will need evaluation by general surgery for possible drainage.  Patient  is not due for his next dose of Plavix  until morning, resume in the morning if okay with general surgery. -Continue atorvastatin  80 mg daily and ezetimibe 10 mg daily -Telemetry monitoring -Allow for permissive hypertension up to 220/110 for the first 24 hours followed by gradual reduction of blood pressure toward goal normotension avoiding dropping BP >15% daily. -MRI of the brain without contrast -TTE with bubble -Hemoglobin A1c, fasting lipid panel (goal LDL <70 and goal A1c <7) -Frequent neurochecks -PT, OT, speech therapy.   Sepsis secondary to right upper/inner thigh cellulitis with a small subcutaneous abscess Met SIRS criteria at the time of presentation to the ED with fever, tachycardia, and leukocytosis.  Patient was given Tylenol , vancomycin, and ceftriaxone in the ED.  Fever and tachycardia now resolved.  No hypotension.  Continue IV antibiotics.  Check lactic acid level and trend WBC count.  Blood cultures ordered.  Patient will need evaluation by general surgery in the morning to see if abscess can be drained.  Keep n.p.o. for now.   Poorly controlled type 2 diabetes Per pharmacy med rec, patient is on Farxiga, metformin , Actos , and Trulicity  but not using insulin  at home.  Glucose in the 250s.  Last hemoglobin A1c 9.8 in March 2025, repeat ordered.  Placed on sensitive sliding scale insulin  every 4 hours for now while NPO.  Hypertension Holding antihypertensives at this time to allow permissive hypertension as above.  OSA Continue CPAP at night.  GERD Continue pantoprazole .  Depression Continue sertraline  and trazodone .  DVT prophylaxis: SCDs Code Status: Full Code (discussed with the patient) Level of care: Progressive Care Unit Admission status: It is my clinical opinion that referral for OBSERVATION is reasonable and necessary in this patient based on the above information provided. The aforementioned taken together are felt to place the patient at high risk for  further clinical deterioration. However, it is anticipated that the patient may be medically stable for discharge from the hospital within 24 to 48 hours.  Editha Ram MD [facility]  If 7PM-7AM, please contact night-coverage [email or web address]  02/04/2024, 2:17 AM

## 2024-02-04 NOTE — ED Notes (Signed)
 This RN called [facility] to arrange transport.  [facility] rep states truck should be on their way now.

## 2024-02-04 NOTE — Progress Notes (Signed)
Placed patient on CPAP for the night with pressure set at 16cmH20

## 2024-02-04 NOTE — Progress Notes (Signed)
 Pharmacy Antibiotic Note  Christopher Roberson is a 48 y.o. male admitted on 02/03/2024 with RLE cellulitis with small subcutaneous abscess.  Pharmacy has been consulted for Vancomycin dosing.  Plan: Vancomycin 1250mg  IV q12h for estimated AUC 460 (goal 400-550) Monitor renal function and cx data      Temp (24hrs), Avg:99.8 F (37.7 C), Min:98.5 F (36.9 C), Max:101.1 F (38.4 C)  Recent Labs  Lab 02/03/24 2155 02/03/24 2222  WBC 17.4*  --   CREATININE 1.05 0.90    CrCl cannot be calculated (Unknown ideal weight.).    Allergies  Allergen Reactions   Percocet [Oxycodone-Acetaminophen ] Itching    Can take if takes with Benadryl     Antimicrobials this admission: 10/11 Rocephin >>  10/12 Vancomycin >>   Dose adjustments this admission:  Microbiology results: 10/12 UCx:  10/11 Resp PCR negative  Thank you for allowing pharmacy to be a part of this patient's care.  Rosaline Millet PharmD 02/04/2024 3:44 AM

## 2024-02-04 NOTE — ED Notes (Signed)
 Transfer of care report called to [facility] at 3W.  Discussed pt Dx, Hx, Sx, current condition, lines, labs, vitals, medications administered.  All questions asked were answered.

## 2024-02-04 NOTE — Consult Note (Signed)
 CC: RLE abscess  Requesting provider: Dr Mcarthur  HPI: Christopher Roberson is an 48 y.o. 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  He is being admitted for stroke workup.  Pt was noted to have cellulits of the right thigh, wbc was 17, glucose was 259 on admission.  CT RLE showed anteriomedial cellulitis and 2/4 cm abscess.  Pt started on Vanc and ceftriaxone.  Vitals stable.  Pt is on Plavix .  Past Medical History:  Diagnosis Date   Depression    Diabetes mellitus    Hypertension    Obesity    Sleep apnea    On CPAP machine   Stroke [facility])     Past Surgical History:  Procedure Laterality Date   APPENDECTOMY     CARPAL TUNNEL RELEASE     ESOPHAGOGASTRODUODENOSCOPY     [facility] around 2015-2016   HERNIA REPAIR      Family History  Problem Relation Age of Onset   Heart disease Mother    Colon cancer Father    Cancer Paternal Grandmother    Prostate cancer Paternal Grandfather    Colon cancer Paternal Uncle    Prostate cancer Paternal Uncle    Prostate cancer Paternal Uncle    Esophageal cancer Neg Hx    Rectal cancer Neg Hx    Stomach cancer Neg Hx     Social:  reports that he has never smoked. He has never used smokeless tobacco. He reports that he does not drink alcohol and does not use drugs.  Allergies:  Allergies  Allergen Reactions   Percocet [Oxycodone-Acetaminophen ] Itching    Can take if takes with Benadryl     Medications: I have reviewed the patient's current medications.  Results for orders placed or performed during the hospital encounter of 02/03/24 (from the past 48 hours)  Protime-INR     Status: None   Collection Time: 02/03/24  9:55 PM  Result Value Ref Range   Prothrombin Time 14.5 11.4 - 15.2 seconds   INR 1.1 0.8 - 1.2    Comment: (NOTE) INR goal varies based on device and disease states. Performed at [facility], [address] [place], [place]  [address]   APTT     Status: None   Collection Time: 02/03/24  9:55 PM  Result Value Ref Range   aPTT 33 24 - 36 seconds    Comment: Performed at [facility], [address] [place], [place] [address]  CBC     Status: Abnormal   Collection Time: 02/03/24  9:55 PM  Result Value Ref Range   WBC 17.4 (H) 4.0 - 10.5 K/uL   RBC 4.47 4.22 - 5.81 MIL/uL   Hemoglobin 13.0 13.0 - 17.0 g/dL   HCT 59.8 60.9 - 47.9 %   MCV 89.7 80.0 - 100.0 fL   MCH 29.1 26.0 - 34.0 pg   MCHC 32.4 30.0 - 36.0 g/dL   RDW 88.0 88.4 - 84.4 %   Platelets 309 150 - 400 K/uL   nRBC 0.0 0.0 - 0.2 %    Comment: Performed at [facility], [address], [place], [place] [address]  Differential     Status: Abnormal   Collection Time: 02/03/24  9:55 PM  Result Value Ref Range   Neutrophils Relative % 83 %   Neutro Abs 14.5 (H) 1.7 - 7.7 K/uL   Lymphocytes Relative 9 %  Lymphs Abs 1.6 0.7 - 4.0 K/uL   Monocytes Relative 6 %   Monocytes Absolute 1.1 (H) 0.1 - 1.0 K/uL   Eosinophils Relative 1 %   Eosinophils Absolute 0.2 0.0 - 0.5 K/uL   Basophils Relative 0 %   Basophils Absolute 0.1 0.0 - 0.1 K/uL   Immature Granulocytes 1 %   Abs Immature Granulocytes 0.08 (H) 0.00 - 0.07 K/uL    Comment: Performed at [facility], [address] [place], [place] [address]  Comprehensive metabolic panel     Status: Abnormal   Collection Time: 02/03/24  9:55 PM  Result Value Ref Range   Sodium 137 135 - 145 mmol/L   Potassium 3.7 3.5 - 5.1 mmol/L   Chloride 97 (L) 98 - 111 mmol/L   CO2 29 22 - 32 mmol/L   Glucose, Bld 259 (H) 70 - 99 mg/dL    Comment: Glucose reference range applies only to samples taken after fasting for at least 8 hours.   BUN 12 6 - 20 mg/dL   Creatinine, Ser 8.94 0.61 - 1.24 mg/dL   Calcium  10.0 8.9 - 10.3 mg/dL   Total Protein 8.2 (H) 6.5 - 8.1 g/dL   Albumin 4.3 3.5 - 5.0 g/dL   AST 19 15 - 41 U/L   ALT 15 0 - 44 U/L   Alkaline Phosphatase  78 38 - 126 U/L   Total Bilirubin 1.5 (H) 0.0 - 1.2 mg/dL   GFR, Estimated >39 >39 mL/min    Comment: (NOTE) Calculated using the CKD-EPI Creatinine Equation (2021)    Anion gap 11 5 - 15    Comment: Performed at [facility], [address], [place], [place] [address]  Ethanol     Status: None   Collection Time: 02/03/24  9:55 PM  Result Value Ref Range   Alcohol, Ethyl (B) <15 <15 mg/dL    Comment: (NOTE) For medical purposes only. Performed at [facility], [address], [place], [place] [address]   Resp panel by RT-PCR (RSV, Flu A&B, Covid) Anterior Nasal Swab     Status: None   Collection Time: 02/03/24 10:19 PM   Specimen: Anterior Nasal Swab  Result Value Ref Range   SARS Coronavirus 2 by RT PCR NEGATIVE NEGATIVE    Comment: (NOTE) SARS-CoV-2 target nucleic acids are NOT DETECTED.  The SARS-CoV-2 RNA is generally detectable in upper respiratory specimens during the acute phase of infection. The lowest concentration of SARS-CoV-2 viral copies this assay can detect is 138 copies/mL. A negative result does not preclude SARS-Cov-2 infection and should not be used as the sole basis for treatment or other patient management decisions. A negative result may occur with  improper specimen collection/handling, submission of specimen other than nasopharyngeal swab, presence of viral mutation(s) within the areas targeted by this assay, and inadequate number of viral copies(<138 copies/mL). A negative result must be combined with clinical observations, patient history, and epidemiological information. The expected result is Negative.  Fact Sheet for Patients:  BloggerCourse.com  Fact Sheet for Healthcare Providers:  SeriousBroker.it  This test is no t yet approved or cleared by the [place]  FDA and  has been authorized for detection and/or diagnosis of SARS-CoV-2 by FDA under an  Emergency Use Authorization (EUA). This EUA will remain  in effect (meaning this test can be used) for the duration of the COVID-19 declaration under Section 564(b)(1) of the Act, 21 U.S.C.section 360bbb-3(b)(1), unless the authorization is terminated  or revoked sooner.       Influenza A by PCR NEGATIVE NEGATIVE   Influenza B by PCR NEGATIVE NEGATIVE    Comment: (NOTE) The Xpert Xpress SARS-CoV-2/FLU/RSV plus assay is intended as an aid in the diagnosis of influenza from Nasopharyngeal swab specimens and should not be used as a sole basis for treatment. Nasal washings and aspirates are unacceptable for Xpert Xpress SARS-CoV-2/FLU/RSV testing.  Fact Sheet for Patients: BloggerCourse.com  Fact Sheet for Healthcare Providers: SeriousBroker.it  This test is not yet approved or cleared by the [place]  FDA and has been authorized for detection and/or diagnosis of SARS-CoV-2 by FDA under an Emergency Use Authorization (EUA). This EUA will remain in effect (meaning this test can be used) for the duration of the COVID-19 declaration under Section 564(b)(1) of the Act, 21 U.S.C. section 360bbb-3(b)(1), unless the authorization is terminated or revoked.     Resp Syncytial Virus by PCR NEGATIVE NEGATIVE    Comment: (NOTE) Fact Sheet for Patients: BloggerCourse.com  Fact Sheet for Healthcare Providers: SeriousBroker.it  This test is not yet approved or cleared by the [place]  FDA and has been authorized for detection and/or diagnosis of SARS-CoV-2 by FDA under an Emergency Use Authorization (EUA). This EUA will remain in effect (meaning this test can be used) for the duration of the COVID-19 declaration under Section 564(b)(1) of the Act, 21 U.S.C. section 360bbb-3(b)(1), unless the authorization is terminated or revoked.  Performed at [facility], [address] [place], [place] [address]   Dozier brew [address], ED     Status: Abnormal   Collection Time: 02/03/24 10:22 PM  Result Value Ref Range   Sodium 136 135 - 145 mmol/L   Potassium 3.5 3.5 - 5.1 mmol/L   Chloride 100 98 - 111 mmol/L   BUN 12 6 - 20 mg/dL   Creatinine, Ser 9.09 0.61 - 1.24 mg/dL   Glucose, Bld 721 (H) 70 - 99 mg/dL    Comment: Glucose reference range applies only to samples taken after fasting for at least 8 hours.   Calcium , Ion 1.17 1.15 - 1.40 mmol/L   TCO2 25 22 - 32 mmol/L   Hemoglobin 13.3 13.0 - 17.0 g/dL   HCT 60.9 60.9 - 47.9 %  Urine rapid drug screen (hosp performed)     Status: None   Collection Time: 02/04/24 12:25 AM  Result Value Ref Range   Opiates NEGATIVE NEGATIVE   Cocaine NEGATIVE NEGATIVE   Benzodiazepines NEGATIVE NEGATIVE   Amphetamines NEGATIVE NEGATIVE   Tetrahydrocannabinol NEGATIVE NEGATIVE   Barbiturates NEGATIVE NEGATIVE   Methadone Scn, Ur NEGATIVE NEGATIVE   Fentanyl NEGATIVE NEGATIVE    Comment: (NOTE) Drug screen is for Medical Purposes only. Positive results are preliminary only. If confirmation is needed, notify lab within 5 days.  Drug Class                 Cutoff (ng/mL) Amphetamine and metabolites 1000 Barbiturate and metabolites 200 Benzodiazepine              200 Opiates and metabolites     300 Cocaine and metabolites     300 THC                         50 Fentanyl                    5 Methadone  300  Trazodone  is metabolized in vivo to several metabolites,  including pharmacologically active m-CPP, which is excreted in the  urine.  Immunoassay screens for amphetamines and MDMA have potential  cross-reactivity with these compounds and may provide false positive  result.  Performed at [facility], [address] [place], [place] [address]   Urinalysis, Routine w reflex microscopic -Urine, Clean Catch     Status: Abnormal   Collection Time: 02/04/24 12:25 AM  Result Value  Ref Range   Color, Urine YELLOW YELLOW   APPearance CLEAR CLEAR   Specific Gravity, Urine <1.005 (L) 1.005 - 1.030   pH 5.0 5.0 - 8.0   Glucose, UA >=500 (A) NEGATIVE mg/dL   Hgb urine dipstick SMALL (A) NEGATIVE   Bilirubin Urine NEGATIVE NEGATIVE   Ketones, ur NEGATIVE NEGATIVE mg/dL   Protein, ur 30 (A) NEGATIVE mg/dL   Nitrite NEGATIVE NEGATIVE   Leukocytes,Ua NEGATIVE NEGATIVE   RBC / HPF 0-5 0 - 5 RBC/hpf   WBC, UA 0-5 0 - 5 WBC/hpf   Bacteria, UA NONE SEEN NONE SEEN   Squamous Epithelial / HPF 0-5 0 - 5 /HPF   Mucus PRESENT     Comment: Performed at [facility], [address] [place], [place] [address]  Lipid panel     Status: None   Collection Time: 02/04/24  4:07 AM  Result Value Ref Range   Cholesterol 154 0 - 200 mg/dL    Comment:        ATP III CLASSIFICATION:  <200     mg/dL   Desirable  799-760  mg/dL   Borderline High  >=759    mg/dL   High           Triglycerides 73 <150 mg/dL   HDL 67 >59 mg/dL   Total CHOL/HDL Ratio 2.3 RATIO   VLDL 15 0 - 40 mg/dL   LDL Cholesterol 73 0 - 99 mg/dL    Comment:        Total Cholesterol/HDL:CHD Risk Coronary Heart Disease Risk Table                     Men   Women  1/2 Average Risk   3.4   3.3  Average Risk       5.0   4.4  2 X Average Risk   9.6   7.1  3 X Average Risk  23.4   11.0        Use the calculated Patient Ratio above and the CHD Risk Table to determine the patient's CHD Risk.        ATP III CLASSIFICATION (LDL):  <100     mg/dL   Optimal  899-870  mg/dL   Near or Above                    Optimal  130-159  mg/dL   Borderline  839-810  mg/dL   High  >809     mg/dL   Very High Performed at [facility], [address] [place], [place] [address]   Hemoglobin A1c     Status: Abnormal   Collection Time: 02/04/24  4:07 AM  Result Value Ref Range   Hgb A1c MFr Bld 7.2 (H) 4.8 - 5.6 %    Comment: (NOTE) Diagnosis of Diabetes The following HbA1c ranges recommended  by the American Diabetes Association (ADA) may be used as an aid in the diagnosis of  diabetes mellitus.  Hemoglobin             Suggested A1C NGSP%              Diagnosis  <5.7                   Non Diabetic  5.7-6.4                Pre-Diabetic  >6.4                   Diabetic  <7.0                   Glycemic control for                       adults with diabetes.     Mean Plasma Glucose 159.94 mg/dL    Comment: Performed at [facility], [address], [place], [place] [address]  Lactic acid, plasma     Status: None   Collection Time: 02/04/24  4:07 AM  Result Value Ref Range   Lactic Acid, Venous 1.0 0.5 - 1.9 mmol/L    Comment: Performed at [facility], [address], [place], [place] [address]  CBC     Status: Abnormal   Collection Time: 02/04/24  4:07 AM  Result Value Ref Range   WBC 16.5 (H) 4.0 - 10.5 K/uL   RBC 4.25 4.22 - 5.81 MIL/uL   Hemoglobin 12.3 (L) 13.0 - 17.0 g/dL   HCT 61.9 (L) 60.9 - 47.9 %   MCV 89.4 80.0 - 100.0 fL   MCH 28.9 26.0 - 34.0 pg   MCHC 32.4 30.0 - 36.0 g/dL   RDW 88.0 88.4 - 84.4 %   Platelets 278 150 - 400 K/uL   nRBC 0.0 0.0 - 0.2 %    Comment: Performed at [facility], [address] [place], [place] [address]  Comprehensive metabolic panel     Status: Abnormal   Collection Time: 02/04/24  4:07 AM  Result Value Ref Range   Sodium 135 135 - 145 mmol/L   Potassium 3.6 3.5 - 5.1 mmol/L   Chloride 100 98 - 111 mmol/L   CO2 26 22 - 32 mmol/L   Glucose, Bld 321 (H) 70 - 99 mg/dL    Comment: Glucose reference range applies only to samples taken after fasting for at least 8 hours.   BUN 12 6 - 20 mg/dL   Creatinine, Ser 9.19 0.61 - 1.24 mg/dL   Calcium  9.3 8.9 - 10.3 mg/dL   Total Protein 6.8 6.5 - 8.1 g/dL   Albumin 3.7 3.5 - 5.0 g/dL   AST 21 15 - 41 U/L   ALT 15 0 - 44 U/L   Alkaline Phosphatase 75 38 - 126 U/L   Total Bilirubin 0.9 0.0 - 1.2 mg/dL   GFR, Estimated >39 >39 mL/min     Comment: (NOTE) Calculated using the CKD-EPI Creatinine Equation (2021)    Anion gap 9 5 - 15    Comment: Performed at [facility], [address], [place], [place] [address]  CBG monitoring, ED     Status: Abnormal   Collection Time: 02/04/24  4:08 AM  Result Value Ref Range   Glucose-Capillary 292 (H) 70 - 99 mg/dL    Comment: Glucose reference range applies only to samples taken after fasting for at least 8 hours.  CBG monitoring, ED  Status: Abnormal   Collection Time: 02/04/24  8:10 AM  Result Value Ref Range   Glucose-Capillary 261 (H) 70 - 99 mg/dL    Comment: Glucose reference range applies only to samples taken after fasting for at least 8 hours.    CT ANGIO HEAD NECK W WO CM Result Date: 02/03/2024 EXAM: CTA HEAD AND NECK WITHOUT AND WITH IV CONTRAST 02/03/2024 10:51:54 PM TECHNIQUE: CTA of the head and neck was performed without and with the administration of 75mL intravenous contrast (iohexol  (OMNIPAQUE ) 350 MG/ML injection 75 mL IOHEXOL  350 MG/ML SOLN). Multiplanar 2D and/or 3D reformatted images are provided for review. Automated exposure control, iterative reconstruction, and/or weight based adjustment of the mA/kV was utilized to reduce the radiation dose to as low as reasonably achievable. Stenosis of the internal carotid arteries measured using NASCET criteria. COMPARISON: None available CLINICAL HISTORY: Stroke suspected (Ped 0-17y); right hemiparesis. FINDINGS: AORTIC ARCH AND ARCH VESSELS: No dissection or arterial injury. No significant stenosis of the brachiocephalic or subclavian arteries. CERVICAL CAROTID ARTERIES: No dissection, arterial injury, or hemodynamically significant stenosis by NASCET criteria. CERVICAL VERTEBRAL ARTERIES: No dissection, arterial injury, or significant stenosis. LUNGS AND MEDIASTINUM: Unremarkable. SOFT TISSUES: No acute abnormality. BONES: No acute abnormality. ANTERIOR CIRCULATION: No significant stenosis of the  internal carotid arteries. No significant stenosis of the anterior cerebral arteries. No significant stenosis of the middle cerebral arteries. No aneurysm. POSTERIOR CIRCULATION: No significant stenosis of the posterior cerebral arteries. No significant stenosis of the basilar artery. No significant stenosis of the vertebral arteries. No aneurysm. OTHER: No dural venous sinus thrombosis on this non-dedicated study. IMPRESSION: 1. No large vessel occlusion, hemodynamically significant stenosis, or aneurysm in the head or neck. Electronically signed by: Gilmore Molt MD 02/03/2024 11:14 PM EDT RP Workstation: [id number]   CT EXTREMITY LOWER RIGHT W CONTRAST Result Date: 02/03/2024 CLINICAL DATA:  Soft tissue swelling in the right thigh EXAM: CT OF THE LOWER RIGHT EXTREMITY WITH CONTRAST TECHNIQUE: Multidetector CT imaging of the lower right extremity was performed according to the standard protocol following intravenous contrast administration. RADIATION DOSE REDUCTION: This exam was performed according to the departmental dose-optimization program which includes automated exposure control, adjustment of the mA and/or kV according to patient size and/or use of iterative reconstruction technique. CONTRAST:  75mL OMNIPAQUE  IOHEXOL  350 MG/ML SOLN COMPARISON:  None Available. FINDINGS: Bones/Joint/Cartilage Right femur is well visualized and within normal limits. No acute fracture is noted. Ligaments Suboptimally assessed by CT. Muscles and Tendons Musculature of the right thigh appears within normal limits. No focal hematoma is noted. Soft tissues Considerable subcutaneous edema is identified within the anteromedial aspect of the right thigh. Overlying skin thickening is noted consistent with cellulitis. Foci of air are noted superiorly as well as a small air-fluid collection which measures approximately 2.4 cm in greatest dimension consistent with a small abscess. Reactive adenopathy in the right inguinal region  is seen. No other soft tissue abnormality is noted. IMPRESSION: Subcutaneous abscess with significant surrounding edema skin thickening. The air-fluid collection measures approximately 2.4 cm. Electronically Signed   By: Oneil Devonshire M.D.   On: 02/03/2024 23:12   CT HEAD CODE STROKE WO CONTRAST (LKW 0-4.5h, LVO 0-24h) Result Date: 02/03/2024 EXAM: CT HEAD WITHOUT CONTRAST 02/03/2024 09:59:12 PM TECHNIQUE: CT of the head was performed without the administration of intravenous contrast. Automated exposure control, iterative reconstruction, and/or weight based adjustment of the mA/kV was utilized to reduce the radiation dose to as low as reasonably achievable. COMPARISON: None available. CLINICAL  HISTORY: Neuro deficit, acute, stroke suspected. FINDINGS: BRAIN AND VENTRICLES: No acute hemorrhage. No evidence of acute infarct. No hydrocephalus. No extra-axial collection. No mass effect or midline shift. ORBITS: No acute abnormality. SINUSES: No acute abnormality. SOFT TISSUES AND SKULL: No acute soft tissue abnormality. No skull fracture. IMPRESSION: 1. No acute intracranial abnormality.  ASPECTS 10. Findings discussed with Dr. Zammit via telephone at 10:17 PM Electronically signed by: Gilmore Molt MD 02/03/2024 10:19 PM EDT RP Workstation: [id number]    ROS - all of the below systems have been reviewed with the patient and positives are indicated with bold text General: chills, fever or night sweats Eyes: blurry vision or double vision ENT: epistaxis or sore throat Hematologic/Lymphatic: bleeding problems, blood clots or swollen lymph nodes Endocrine: temperature intolerance or unexpected weight changes Breast: new or changing breast lumps or nipple discharge Resp: cough, shortness of breath, or wheezing CV: chest pain or dyspnea on exertion GI: as per HPI GU: dysuria, trouble voiding, or hematuria Neuro: TIA or stroke symptoms    PE Blood pressure (!) 178/94, pulse 95, temperature 99.4 F  (37.4 C), temperature source Oral, resp. rate 18, SpO2 96%. Constitutional: NAD; conversant; no deformities Eyes: Moist conjunctiva; no lid lag; anicteric; PERRL Neck: Trachea midline; no thyromegaly Lungs: Normal respiratory effort CV: RRR GI: Abd soft, obese MSK: R groin cellulitis with subcutaneous indurated area in center.  Normal range of motion of extremities; no clubbing/cyanosis Psychiatric: Appropriate affect; alert and oriented x3  Results for orders placed or performed during the hospital encounter of 02/03/24 (from the past 48 hours)  Protime-INR     Status: None   Collection Time: 02/03/24  9:55 PM  Result Value Ref Range   Prothrombin Time 14.5 11.4 - 15.2 seconds   INR 1.1 0.8 - 1.2    Comment: (NOTE) INR goal varies based on device and disease states. Performed at [facility], [address] [place], [place] [address]   APTT     Status: None   Collection Time: 02/03/24  9:55 PM  Result Value Ref Range   aPTT 33 24 - 36 seconds    Comment: Performed at [facility], [address], [place], [place] [address]  CBC     Status: Abnormal   Collection Time: 02/03/24  9:55 PM  Result Value Ref Range   WBC 17.4 (H) 4.0 - 10.5 K/uL   RBC 4.47 4.22 - 5.81 MIL/uL   Hemoglobin 13.0 13.0 - 17.0 g/dL   HCT 59.8 60.9 - 47.9 %   MCV 89.7 80.0 - 100.0 fL   MCH 29.1 26.0 - 34.0 pg   MCHC 32.4 30.0 - 36.0 g/dL   RDW 88.0 88.4 - 84.4 %   Platelets 309 150 - 400 K/uL   nRBC 0.0 0.0 - 0.2 %    Comment: Performed at [facility], [address] [place], [place] [address]  Differential     Status: Abnormal   Collection Time: 02/03/24  9:55 PM  Result Value Ref Range   Neutrophils Relative % 83 %   Neutro Abs 14.5 (H) 1.7 - 7.7 K/uL   Lymphocytes Relative 9 %   Lymphs Abs 1.6 0.7 - 4.0 K/uL   Monocytes Relative 6 %   Monocytes Absolute 1.1 (H) 0.1 - 1.0 K/uL   Eosinophils Relative 1 %   Eosinophils Absolute 0.2 0.0  - 0.5 K/uL   Basophils Relative 0 %   Basophils Absolute 0.1 0.0 - 0.1  K/uL   Immature Granulocytes 1 %   Abs Immature Granulocytes 0.08 (H) 0.00 - 0.07 K/uL    Comment: Performed at [facility], [address] [place], [place] [address]  Comprehensive metabolic panel     Status: Abnormal   Collection Time: 02/03/24  9:55 PM  Result Value Ref Range   Sodium 137 135 - 145 mmol/L   Potassium 3.7 3.5 - 5.1 mmol/L   Chloride 97 (L) 98 - 111 mmol/L   CO2 29 22 - 32 mmol/L   Glucose, Bld 259 (H) 70 - 99 mg/dL    Comment: Glucose reference range applies only to samples taken after fasting for at least 8 hours.   BUN 12 6 - 20 mg/dL   Creatinine, Ser 8.94 0.61 - 1.24 mg/dL   Calcium  10.0 8.9 - 10.3 mg/dL   Total Protein 8.2 (H) 6.5 - 8.1 g/dL   Albumin 4.3 3.5 - 5.0 g/dL   AST 19 15 - 41 U/L   ALT 15 0 - 44 U/L   Alkaline Phosphatase 78 38 - 126 U/L   Total Bilirubin 1.5 (H) 0.0 - 1.2 mg/dL   GFR, Estimated >39 >39 mL/min    Comment: (NOTE) Calculated using the CKD-EPI Creatinine Equation (2021)    Anion gap 11 5 - 15    Comment: Performed at [facility], [address] [place], [place] [address]  Ethanol     Status: None   Collection Time: 02/03/24  9:55 PM  Result Value Ref Range   Alcohol, Ethyl (B) <15 <15 mg/dL    Comment: (NOTE) For medical purposes only. Performed at [facility], [address] [place], [place] [address]   Resp panel by RT-PCR (RSV, Flu A&B, Covid) Anterior Nasal Swab     Status: None   Collection Time: 02/03/24 10:19 PM   Specimen: Anterior Nasal Swab  Result Value Ref Range   SARS Coronavirus 2 by RT PCR NEGATIVE NEGATIVE    Comment: (NOTE) SARS-CoV-2 target nucleic acids are NOT DETECTED.  The SARS-CoV-2 RNA is generally detectable in upper respiratory specimens during the acute phase of infection. The lowest concentration of SARS-CoV-2 viral copies this assay can detect is 138  copies/mL. A negative result does not preclude SARS-Cov-2 infection and should not be used as the sole basis for treatment or other patient management decisions. A negative result may occur with  improper specimen collection/handling, submission of specimen other than nasopharyngeal swab, presence of viral mutation(s) within the areas targeted by this assay, and inadequate number of viral copies(<138 copies/mL). A negative result must be combined with clinical observations, patient history, and epidemiological information. The expected result is Negative.  Fact Sheet for Patients:  BloggerCourse.com  Fact Sheet for Healthcare Providers:  SeriousBroker.it  This test is no t yet approved or cleared by the [place]  FDA and  has been authorized for detection and/or diagnosis of SARS-CoV-2 by FDA under an Emergency Use Authorization (EUA). This EUA will remain  in effect (meaning this test can be used) for the duration of the COVID-19 declaration under Section 564(b)(1) of the Act, 21 U.S.C.section 360bbb-3(b)(1), unless the authorization is terminated  or revoked sooner.       Influenza A by PCR NEGATIVE NEGATIVE   Influenza B by PCR NEGATIVE NEGATIVE    Comment: (NOTE) The Xpert Xpress SARS-CoV-2/FLU/RSV plus assay is intended as an aid in the diagnosis of influenza from Nasopharyngeal swab specimens and should not be used  as a sole basis for treatment. Nasal washings and aspirates are unacceptable for Xpert Xpress SARS-CoV-2/FLU/RSV testing.  Fact Sheet for Patients: BloggerCourse.com  Fact Sheet for Healthcare Providers: SeriousBroker.it  This test is not yet approved or cleared by the [place]  FDA and has been authorized for detection and/or diagnosis of SARS-CoV-2 by FDA under an Emergency Use Authorization (EUA). This EUA will remain in effect (meaning this test  can be used) for the duration of the COVID-19 declaration under Section 564(b)(1) of the Act, 21 U.S.C. section 360bbb-3(b)(1), unless the authorization is terminated or revoked.     Resp Syncytial Virus by PCR NEGATIVE NEGATIVE    Comment: (NOTE) Fact Sheet for Patients: BloggerCourse.com  Fact Sheet for Healthcare Providers: SeriousBroker.it  This test is not yet approved or cleared by the [place]  FDA and has been authorized for detection and/or diagnosis of SARS-CoV-2 by FDA under an Emergency Use Authorization (EUA). This EUA will remain in effect (meaning this test can be used) for the duration of the COVID-19 declaration under Section 564(b)(1) of the Act, 21 U.S.C. section 360bbb-3(b)(1), unless the authorization is terminated or revoked.  Performed at [facility], [address], [place], [place] [address]   [facility] [address], ED     Status: Abnormal   Collection Time: 02/03/24 10:22 PM  Result Value Ref Range   Sodium 136 135 - 145 mmol/L   Potassium 3.5 3.5 - 5.1 mmol/L   Chloride 100 98 - 111 mmol/L   BUN 12 6 - 20 mg/dL   Creatinine, Ser 9.09 0.61 - 1.24 mg/dL   Glucose, Bld 721 (H) 70 - 99 mg/dL    Comment: Glucose reference range applies only to samples taken after fasting for at least 8 hours.   Calcium , Ion 1.17 1.15 - 1.40 mmol/L   TCO2 25 22 - 32 mmol/L   Hemoglobin 13.3 13.0 - 17.0 g/dL   HCT 60.9 60.9 - 47.9 %  Urine rapid drug screen (hosp performed)     Status: None   Collection Time: 02/04/24 12:25 AM  Result Value Ref Range   Opiates NEGATIVE NEGATIVE   Cocaine NEGATIVE NEGATIVE   Benzodiazepines NEGATIVE NEGATIVE   Amphetamines NEGATIVE NEGATIVE   Tetrahydrocannabinol NEGATIVE NEGATIVE   Barbiturates NEGATIVE NEGATIVE   Methadone Scn, Ur NEGATIVE NEGATIVE   Fentanyl NEGATIVE NEGATIVE    Comment: (NOTE) Drug screen is for Medical Purposes only. Positive results  are preliminary only. If confirmation is needed, notify lab within 5 days.  Drug Class                 Cutoff (ng/mL) Amphetamine and metabolites 1000 Barbiturate and metabolites 200 Benzodiazepine              200 Opiates and metabolites     300 Cocaine and metabolites     300 THC                         50 Fentanyl                    5 Methadone                   300  Trazodone  is metabolized in vivo to several metabolites,  including pharmacologically active m-CPP, which is excreted in the  urine.  Immunoassay screens for amphetamines and MDMA have potential  cross-reactivity with these compounds and may provide false positive  result.  Performed at [facility], [address], [place], [place] [address]   Urinalysis, Routine w reflex microscopic -Urine, Clean Catch     Status: Abnormal   Collection Time: 02/04/24 12:25 AM  Result Value Ref Range   Color, Urine YELLOW YELLOW   APPearance CLEAR CLEAR   Specific Gravity, Urine <1.005 (L) 1.005 - 1.030   pH 5.0 5.0 - 8.0   Glucose, UA >=500 (A) NEGATIVE mg/dL   Hgb urine dipstick SMALL (A) NEGATIVE   Bilirubin Urine NEGATIVE NEGATIVE   Ketones, ur NEGATIVE NEGATIVE mg/dL   Protein, ur 30 (A) NEGATIVE mg/dL   Nitrite NEGATIVE NEGATIVE   Leukocytes,Ua NEGATIVE NEGATIVE   RBC / HPF 0-5 0 - 5 RBC/hpf   WBC, UA 0-5 0 - 5 WBC/hpf   Bacteria, UA NONE SEEN NONE SEEN   Squamous Epithelial / HPF 0-5 0 - 5 /HPF   Mucus PRESENT     Comment: Performed at [facility], [address] [place], [place] [address]  Lipid panel     Status: None   Collection Time: 02/04/24  4:07 AM  Result Value Ref Range   Cholesterol 154 0 - 200 mg/dL    Comment:        ATP III CLASSIFICATION:  <200     mg/dL   Desirable  799-760  mg/dL   Borderline High  >=759    mg/dL   High           Triglycerides 73 <150 mg/dL   HDL 67 >59 mg/dL   Total CHOL/HDL Ratio 2.3 RATIO   VLDL 15 0 - 40 mg/dL   LDL  Cholesterol 73 0 - 99 mg/dL    Comment:        Total Cholesterol/HDL:CHD Risk Coronary Heart Disease Risk Table                     Men   Women  1/2 Average Risk   3.4   3.3  Average Risk       5.0   4.4  2 X Average Risk   9.6   7.1  3 X Average Risk  23.4   11.0        Use the calculated Patient Ratio above and the CHD Risk Table to determine the patient's CHD Risk.        ATP III CLASSIFICATION (LDL):  <100     mg/dL   Optimal  899-870  mg/dL   Near or Above                    Optimal  130-159  mg/dL   Borderline  839-810  mg/dL   High  >809     mg/dL   Very High Performed at [facility], [address] [place], [place] [address]   Hemoglobin A1c     Status: Abnormal   Collection Time: 02/04/24  4:07 AM  Result Value Ref Range   Hgb A1c MFr Bld 7.2 (H) 4.8 - 5.6 %    Comment: (NOTE) Diagnosis of Diabetes The following HbA1c ranges recommended by the American Diabetes Association (ADA) may be used as an aid in the diagnosis of diabetes mellitus.  Hemoglobin             Suggested A1C NGSP%              Diagnosis  <5.7  Non Diabetic  5.7-6.4                Pre-Diabetic  >6.4                   Diabetic  <7.0                   Glycemic control for                       adults with diabetes.     Mean Plasma Glucose 159.94 mg/dL    Comment: Performed at [facility], [address], [place], [place] [address]  Lactic acid, plasma     Status: None   Collection Time: 02/04/24  4:07 AM  Result Value Ref Range   Lactic Acid, Venous 1.0 0.5 - 1.9 mmol/L    Comment: Performed at [facility], [address] [place], [place] [address]  CBC     Status: Abnormal   Collection Time: 02/04/24  4:07 AM  Result Value Ref Range   WBC 16.5 (H) 4.0 - 10.5 K/uL   RBC 4.25 4.22 - 5.81 MIL/uL   Hemoglobin 12.3 (L) 13.0 - 17.0 g/dL   HCT 61.9 (L) 60.9 - 47.9 %   MCV 89.4 80.0 - 100.0 fL   MCH 28.9 26.0 - 34.0 pg    MCHC 32.4 30.0 - 36.0 g/dL   RDW 88.0 88.4 - 84.4 %   Platelets 278 150 - 400 K/uL   nRBC 0.0 0.0 - 0.2 %    Comment: Performed at [facility], [address], [place], [place] [address]  Comprehensive metabolic panel     Status: Abnormal   Collection Time: 02/04/24  4:07 AM  Result Value Ref Range   Sodium 135 135 - 145 mmol/L   Potassium 3.6 3.5 - 5.1 mmol/L   Chloride 100 98 - 111 mmol/L   CO2 26 22 - 32 mmol/L   Glucose, Bld 321 (H) 70 - 99 mg/dL    Comment: Glucose reference range applies only to samples taken after fasting for at least 8 hours.   BUN 12 6 - 20 mg/dL   Creatinine, Ser 9.19 0.61 - 1.24 mg/dL   Calcium  9.3 8.9 - 10.3 mg/dL   Total Protein 6.8 6.5 - 8.1 g/dL   Albumin 3.7 3.5 - 5.0 g/dL   AST 21 15 - 41 U/L   ALT 15 0 - 44 U/L   Alkaline Phosphatase 75 38 - 126 U/L   Total Bilirubin 0.9 0.0 - 1.2 mg/dL   GFR, Estimated >39 >39 mL/min    Comment: (NOTE) Calculated using the CKD-EPI Creatinine Equation (2021)    Anion gap 9 5 - 15    Comment: Performed at [facility], [address], [place], [place] [address]  CBG monitoring, ED     Status: Abnormal   Collection Time: 02/04/24  4:08 AM  Result Value Ref Range   Glucose-Capillary 292 (H) 70 - 99 mg/dL    Comment: Glucose reference range applies only to samples taken after fasting for at least 8 hours.  CBG monitoring, ED     Status: Abnormal   Collection Time: 02/04/24  8:10 AM  Result Value Ref Range   Glucose-Capillary 261 (H) 70 - 99 mg/dL    Comment: Glucose reference range applies only to samples taken after fasting for at least 8 hours.    CT ANGIO HEAD  NECK W WO CM Result Date: 02/03/2024 EXAM: CTA HEAD AND NECK WITHOUT AND WITH IV CONTRAST 02/03/2024 10:51:54 PM TECHNIQUE: CTA of the head and neck was performed without and with the administration of 75mL intravenous contrast (iohexol  (OMNIPAQUE ) 350 MG/ML injection 75 mL IOHEXOL  350 MG/ML SOLN). Multiplanar 2D  and/or 3D reformatted images are provided for review. Automated exposure control, iterative reconstruction, and/or weight based adjustment of the mA/kV was utilized to reduce the radiation dose to as low as reasonably achievable. Stenosis of the internal carotid arteries measured using NASCET criteria. COMPARISON: None available CLINICAL HISTORY: Stroke suspected (Ped 0-17y); right hemiparesis. FINDINGS: AORTIC ARCH AND ARCH VESSELS: No dissection or arterial injury. No significant stenosis of the brachiocephalic or subclavian arteries. CERVICAL CAROTID ARTERIES: No dissection, arterial injury, or hemodynamically significant stenosis by NASCET criteria. CERVICAL VERTEBRAL ARTERIES: No dissection, arterial injury, or significant stenosis. LUNGS AND MEDIASTINUM: Unremarkable. SOFT TISSUES: No acute abnormality. BONES: No acute abnormality. ANTERIOR CIRCULATION: No significant stenosis of the internal carotid arteries. No significant stenosis of the anterior cerebral arteries. No significant stenosis of the middle cerebral arteries. No aneurysm. POSTERIOR CIRCULATION: No significant stenosis of the posterior cerebral arteries. No significant stenosis of the basilar artery. No significant stenosis of the vertebral arteries. No aneurysm. OTHER: No dural venous sinus thrombosis on this non-dedicated study. IMPRESSION: 1. No large vessel occlusion, hemodynamically significant stenosis, or aneurysm in the head or neck. Electronically signed by: Gilmore Molt MD 02/03/2024 11:14 PM EDT RP Workstation: [id number]   CT EXTREMITY LOWER RIGHT W CONTRAST Result Date: 02/03/2024 CLINICAL DATA:  Soft tissue swelling in the right thigh EXAM: CT OF THE LOWER RIGHT EXTREMITY WITH CONTRAST TECHNIQUE: Multidetector CT imaging of the lower right extremity was performed according to the standard protocol following intravenous contrast administration. RADIATION DOSE REDUCTION: This exam was performed according to the departmental  dose-optimization program which includes automated exposure control, adjustment of the mA and/or kV according to patient size and/or use of iterative reconstruction technique. CONTRAST:  75mL OMNIPAQUE  IOHEXOL  350 MG/ML SOLN COMPARISON:  None Available. FINDINGS: Bones/Joint/Cartilage Right femur is well visualized and within normal limits. No acute fracture is noted. Ligaments Suboptimally assessed by CT. Muscles and Tendons Musculature of the right thigh appears within normal limits. No focal hematoma is noted. Soft tissues Considerable subcutaneous edema is identified within the anteromedial aspect of the right thigh. Overlying skin thickening is noted consistent with cellulitis. Foci of air are noted superiorly as well as a small air-fluid collection which measures approximately 2.4 cm in greatest dimension consistent with a small abscess. Reactive adenopathy in the right inguinal region is seen. No other soft tissue abnormality is noted. IMPRESSION: Subcutaneous abscess with significant surrounding edema skin thickening. The air-fluid collection measures approximately 2.4 cm. Electronically Signed   By: Oneil Devonshire M.D.   On: 02/03/2024 23:12   CT HEAD CODE STROKE WO CONTRAST (LKW 0-4.5h, LVO 0-24h) Result Date: 02/03/2024 EXAM: CT HEAD WITHOUT CONTRAST 02/03/2024 09:59:12 PM TECHNIQUE: CT of the head was performed without the administration of intravenous contrast. Automated exposure control, iterative reconstruction, and/or weight based adjustment of the mA/kV was utilized to reduce the radiation dose to as low as reasonably achievable. COMPARISON: None available. CLINICAL HISTORY: Neuro deficit, acute, stroke suspected. FINDINGS: BRAIN AND VENTRICLES: No acute hemorrhage. No evidence of acute infarct. No hydrocephalus. No extra-axial collection. No mass effect or midline shift. ORBITS: No acute abnormality. SINUSES: No acute abnormality. SOFT TISSUES AND SKULL: No acute soft tissue abnormality. No skull  fracture. IMPRESSION: 1. No acute intracranial abnormality.  ASPECTS 10. Findings discussed with Dr. Zammit via telephone at 10:17 PM Electronically signed by: Gilmore Molt MD 02/03/2024 10:19 PM EDT RP Workstation: [id number]   CT RLE images reviewed.  R groin abscess seems amenable to drainage   A/P: Christopher Roberson is an 48 y.o. male with R thigh cellulitis and abscess being admitted for stroke work up.  Would recommend I&D.  Could be done at bedside if pt is not a candidate for anesthesia at this time.  Will re-evaluate in the AM.  Ok for diet today, npo p mn.  No need to hold anticoagulation right now.   Bernarda JAYSON Ned, MD  Colorectal and General Surgery [facility]   high medical decision making.

## 2024-02-04 NOTE — Progress Notes (Signed)
 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  He is presenting with worsening right-sided weakness/numbness from baseline.  LKW 1930.  Symptoms have now improved.  CT head showing no acute intracranial abnormality.  CTA head and neck negative for LVO. Seen by teleneurology and they are suspecting stroke recrudescence phenomenon versus recurrent ischemic stroke. -Continue atorvastatin  80 mg daily and ezetimibe 10 mg daily -Telemetry monitoring -Allow for permissive hypertension up to 220/110 for the first 24 hours followed by gradual reduction of blood pressure toward goal normotension avoiding dropping BP >15% daily. -MRI of the brain without contrast is negative for acute stroke.   -TTE with bubble pending -Hemoglobin A1c, fasting lipid panel (goal LDL <70 and goal A1c <7) -Resume Plavix  -Frequent neurochecks -PT, OT, speech therapy.    Sepsis secondary to right upper/inner thigh cellulitis with a small subcutaneous abscess Patient with fever, tachycardia and leukocytosis.  CT has revealed cellulitis as well as subcutaneous abscess about 2.5 cm.  Leukocytosis is persistent. General surgery consulted for drainage.  We will resume Plavix  once I&D completed. (Addendum: Plan for procedure tomorrow, keep n.p.o. after midnight) Monitor vitals closely. Continue antibiotics Vancomycin and ceftriaxone.  Follow-up on cultures.  Poorly controlled type 2 diabetes Per pharmacy med rec, patient is on Farxiga, metformin , Actos , and Trulicity  but not using insulin  at home.  Glucose in the 250s.  Last hemoglobin A1c 9.8  in March 2025, repeat ordered.   ACHS glucose checks and sliding scale coverage  Hypertension Resume antihypertensives gradually that MRI is negative for acute stroke.   OSA Continue CPAP at night.   GERD Continue pantoprazole .   Depression Continue sertraline  and trazodone .   DVT prophylaxis: SCDs Code Status: Full Code (discussed with the patient) Level of care: Progressive Care Unit Admission status: It is my clinical opinion that referral for OBSERVATION is reasonable and necessary in this patient based on the above information provided. The aforementioned taken together are felt to place the patient at high risk for further clinical deterioration. However, it is anticipated that the patient may be medically stable for discharge from the hospital within 24 to 48 hours.   Subjective:  Patient seen and examined at the bedside.  He feels hungry.  He also states he would like to go home if possible.  His vital signs are stable.  He is afebrile.  He understands need for IV antibiotic therapy and drainage  objective: Vitals:   02/04/24 0630 02/04/24 0807 02/04/24 0830 02/04/24 0900  BP: (!) 206/103  (!) 167/83 (!) 178/94  Pulse: 88  94 95  Resp: (!) 23  16 18   Temp:  99.4 F (37.4 C)    TempSrc:  Oral    SpO2: 95%  96% 96%   No intake or output data in the 24 hours ending 02/04/24 0952 There were no vitals filed for this visit.  Examination:  General exam: Appears calm and comfortable  Respiratory system: Bilateral decreased breath sounds at bases Cardiovascular system: S1 & S2 heard, Rate controlled Gastrointestinal system: Abdomen is nondistended, soft and nontender. Normal bowel sounds heard. Extremities: Large area  of localized swelling/edema erythema inner upper thigh on the right, no fluctuation noted.  Central nervous system: Alert and oriented. No focal neurological deficits. Moving extremities Skin: No rashes, lesions or ulcers Psychiatry: Judgement and insight  appear normal. Mood & affect appropriate.     Data Reviewed: I have personally reviewed following labs and imaging studies  CBC: Recent Labs  Lab 02/03/24 2155 02/03/24 2222 02/04/24 0407  WBC 17.4*  --  16.5*  NEUTROABS 14.5*  --   --   HGB 13.0 13.3 12.3*  HCT 40.1 39.0 38.0*  MCV 89.7  --  89.4  PLT 309  --  278   Basic Metabolic Panel: Recent Labs  Lab 02/03/24 2155 02/03/24 2222 02/04/24 0407  NA 137 136 135  K 3.7 3.5 3.6  CL 97* 100 100  CO2 29  --  26  GLUCOSE 259* 278* 321*  BUN 12 12 12   CREATININE 1.05 0.90 0.80  CALCIUM  10.0  --  9.3   GFR: CrCl cannot be calculated (Unknown ideal weight.). Liver Function Tests: Recent Labs  Lab 02/03/24 2155 02/04/24 0407  AST 19 21  ALT 15 15  ALKPHOS 78 75  BILITOT 1.5* 0.9  PROT 8.2* 6.8  ALBUMIN 4.3 3.7   No results for input(s): LIPASE, AMYLASE in the last 168 hours. No results for input(s): AMMONIA in the last 168 hours. Coagulation Profile: Recent Labs  Lab 02/03/24 2155  INR 1.1   Cardiac Enzymes: No results for input(s): CKTOTAL, CKMB, CKMBINDEX, TROPONINI in the last 168 hours. BNP (last 3 results) No results for input(s): PROBNP in the last 8760 hours. HbA1C: Recent Labs    02/04/24 0407  HGBA1C 7.2*   CBG: Recent Labs  Lab 02/04/24 0408 02/04/24 0810  GLUCAP 292* 261*   Lipid Profile: Recent Labs    02/04/24 0407  CHOL 154  HDL 67  LDLCALC 73  TRIG 73  CHOLHDL 2.3   Thyroid Function Tests: No results for input(s): TSH, T4TOTAL, FREET4, T3FREE, THYROIDAB in the last 72 hours. Anemia Panel: No results for input(s): VITAMINB12, FOLATE, FERRITIN, TIBC, IRON, RETICCTPCT in the last 72 hours. Sepsis Labs: Recent Labs  Lab 02/04/24 0407  LATICACIDVEN 1.0    Recent Results (from the past 240 hours)  Resp panel by RT-PCR (RSV, Flu A&B, Covid) Anterior Nasal Swab     Status: None   Collection Time: 02/03/24 10:19 PM   Specimen:  Anterior Nasal Swab  Result Value Ref Range Status   SARS Coronavirus 2 by RT PCR NEGATIVE NEGATIVE Final    Comment: (NOTE) SARS-CoV-2 target nucleic acids are NOT DETECTED.  The SARS-CoV-2 RNA is generally detectable in upper respiratory specimens during the acute phase of infection. The lowest concentration of SARS-CoV-2 viral copies this assay can detect is 138 copies/mL. A negative result does not preclude SARS-Cov-2 infection and should not be used as the sole basis for treatment or other patient management decisions. A negative result may occur with  improper specimen collection/handling, submission of specimen other than nasopharyngeal swab, presence of viral mutation(s) within the areas targeted by this assay, and inadequate number of viral copies(<138 copies/mL). A negative result must be combined with clinical observations, patient history, and epidemiological information. The expected result is Negative.  Fact Sheet for Patients:  BloggerCourse.com  Fact Sheet for Healthcare Providers:  SeriousBroker.it  This test is no t yet approved or cleared by the [place]  FDA and  has been authorized for detection and/or diagnosis of SARS-CoV-2 by  FDA under an Emergency Use Authorization (EUA). This EUA will remain  in effect (meaning this test can be used) for the duration of the COVID-19 declaration under Section 564(b)(1) of the Act, 21 U.S.C.section 360bbb-3(b)(1), unless the authorization is terminated  or revoked sooner.       Influenza A by PCR NEGATIVE NEGATIVE Final   Influenza B by PCR NEGATIVE NEGATIVE Final    Comment: (NOTE) The Xpert Xpress SARS-CoV-2/FLU/RSV plus assay is intended as an aid in the diagnosis of influenza from Nasopharyngeal swab specimens and should not be used as a sole basis for treatment. Nasal washings and aspirates are unacceptable for Xpert Xpress SARS-CoV-2/FLU/RSV testing.  Fact  Sheet for Patients: BloggerCourse.com  Fact Sheet for Healthcare Providers: SeriousBroker.it  This test is not yet approved or cleared by the [place]  FDA and has been authorized for detection and/or diagnosis of SARS-CoV-2 by FDA under an Emergency Use Authorization (EUA). This EUA will remain in effect (meaning this test can be used) for the duration of the COVID-19 declaration under Section 564(b)(1) of the Act, 21 U.S.C. section 360bbb-3(b)(1), unless the authorization is terminated or revoked.     Resp Syncytial Virus by PCR NEGATIVE NEGATIVE Final    Comment: (NOTE) Fact Sheet for Patients: BloggerCourse.com  Fact Sheet for Healthcare Providers: SeriousBroker.it  This test is not yet approved or cleared by the [place]  FDA and has been authorized for detection and/or diagnosis of SARS-CoV-2 by FDA under an Emergency Use Authorization (EUA). This EUA will remain in effect (meaning this test can be used) for the duration of the COVID-19 declaration under Section 564(b)(1) of the Act, 21 U.S.C. section 360bbb-3(b)(1), unless the authorization is terminated or revoked.  Performed at [facility], [address], [place], [place] [address]          Radiology Studies: CT ANGIO HEAD NECK W WO CM Result Date: 02/03/2024 EXAM: CTA HEAD AND NECK WITHOUT AND WITH IV CONTRAST 02/03/2024 10:51:54 PM TECHNIQUE: CTA of the head and neck was performed without and with the administration of 75mL intravenous contrast (iohexol  (OMNIPAQUE ) 350 MG/ML injection 75 mL IOHEXOL  350 MG/ML SOLN). Multiplanar 2D and/or 3D reformatted images are provided for review. Automated exposure control, iterative reconstruction, and/or weight based adjustment of the mA/kV was utilized to reduce the radiation dose to as low as reasonably achievable. Stenosis of the internal carotid  arteries measured using NASCET criteria. COMPARISON: None available CLINICAL HISTORY: Stroke suspected (Ped 0-17y); right hemiparesis. FINDINGS: AORTIC ARCH AND ARCH VESSELS: No dissection or arterial injury. No significant stenosis of the brachiocephalic or subclavian arteries. CERVICAL CAROTID ARTERIES: No dissection, arterial injury, or hemodynamically significant stenosis by NASCET criteria. CERVICAL VERTEBRAL ARTERIES: No dissection, arterial injury, or significant stenosis. LUNGS AND MEDIASTINUM: Unremarkable. SOFT TISSUES: No acute abnormality. BONES: No acute abnormality. ANTERIOR CIRCULATION: No significant stenosis of the internal carotid arteries. No significant stenosis of the anterior cerebral arteries. No significant stenosis of the middle cerebral arteries. No aneurysm. POSTERIOR CIRCULATION: No significant stenosis of the posterior cerebral arteries. No significant stenosis of the basilar artery. No significant stenosis of the vertebral arteries. No aneurysm. OTHER: No dural venous sinus thrombosis on this non-dedicated study. IMPRESSION: 1. No large vessel occlusion, hemodynamically significant stenosis, or aneurysm in the head or neck. Electronically signed by: Gilmore Molt MD 02/03/2024 11:14 PM EDT RP Workstation: [id number]   CT EXTREMITY LOWER RIGHT W CONTRAST Result Date: 02/03/2024 CLINICAL DATA:  Soft tissue swelling in the right thigh EXAM: CT  OF THE LOWER RIGHT EXTREMITY WITH CONTRAST TECHNIQUE: Multidetector CT imaging of the lower right extremity was performed according to the standard protocol following intravenous contrast administration. RADIATION DOSE REDUCTION: This exam was performed according to the departmental dose-optimization program which includes automated exposure control, adjustment of the mA and/or kV according to patient size and/or use of iterative reconstruction technique. CONTRAST:  75mL OMNIPAQUE  IOHEXOL  350 MG/ML SOLN COMPARISON:  None Available. FINDINGS:  Bones/Joint/Cartilage Right femur is well visualized and within normal limits. No acute fracture is noted. Ligaments Suboptimally assessed by CT. Muscles and Tendons Musculature of the right thigh appears within normal limits. No focal hematoma is noted. Soft tissues Considerable subcutaneous edema is identified within the anteromedial aspect of the right thigh. Overlying skin thickening is noted consistent with cellulitis. Foci of air are noted superiorly as well as a small air-fluid collection which measures approximately 2.4 cm in greatest dimension consistent with a small abscess. Reactive adenopathy in the right inguinal region is seen. No other soft tissue abnormality is noted. IMPRESSION: Subcutaneous abscess with significant surrounding edema skin thickening. The air-fluid collection measures approximately 2.4 cm. Electronically Signed   By: Oneil Devonshire M.D.   On: 02/03/2024 23:12   CT HEAD CODE STROKE WO CONTRAST (LKW 0-4.5h, LVO 0-24h) Result Date: 02/03/2024 EXAM: CT HEAD WITHOUT CONTRAST 02/03/2024 09:59:12 PM TECHNIQUE: CT of the head was performed without the administration of intravenous contrast. Automated exposure control, iterative reconstruction, and/or weight based adjustment of the mA/kV was utilized to reduce the radiation dose to as low as reasonably achievable. COMPARISON: None available. CLINICAL HISTORY: Neuro deficit, acute, stroke suspected. FINDINGS: BRAIN AND VENTRICLES: No acute hemorrhage. No evidence of acute infarct. No hydrocephalus. No extra-axial collection. No mass effect or midline shift. ORBITS: No acute abnormality. SINUSES: No acute abnormality. SOFT TISSUES AND SKULL: No acute soft tissue abnormality. No skull fracture. IMPRESSION: 1. No acute intracranial abnormality.  ASPECTS 10. Findings discussed with Dr. Zammit via telephone at 10:17 PM Electronically signed by: Gilmore Molt MD 02/03/2024 10:19 PM EDT RP Workstation: [id number]        Scheduled Meds:   [START ON 02/05/2024]  stroke: early stages of recovery book   Does not apply Once   atorvastatin   80 mg Oral QPM   ezetimibe  10 mg Oral QPM   insulin  aspart  0-9 Units Subcutaneous Q4H   pantoprazole   40 mg Oral Daily   sertraline   100 mg Oral Daily   traZODone   50 mg Oral QHS   Continuous Infusions:  cefTRIAXone (ROCEPHIN)  IV     vancomycin Stopped (02/04/24 0739)          Davine Coba, MD [facility] 02/04/2024, 9:52 AM

## 2024-02-04 NOTE — Progress Notes (Signed)
 Echocardiogram 2D Echocardiogram has been performed.  Delania Ferg N Camaron Cammack,RDCS 02/04/2024, 1:40 PM

## 2024-02-05 ENCOUNTER — Encounter (HOSPITAL_COMMUNITY): Payer: Self-pay | Admitting: Internal Medicine

## 2024-02-05 ENCOUNTER — Other Ambulatory Visit: Payer: Self-pay

## 2024-02-05 DIAGNOSIS — E66813 Obesity, class 3: Secondary | ICD-10-CM

## 2024-02-05 DIAGNOSIS — I10 Essential (primary) hypertension: Secondary | ICD-10-CM

## 2024-02-05 DIAGNOSIS — L02415 Cutaneous abscess of right lower limb: Secondary | ICD-10-CM | POA: Diagnosis not present

## 2024-02-05 DIAGNOSIS — R531 Weakness: Secondary | ICD-10-CM | POA: Diagnosis not present

## 2024-02-05 DIAGNOSIS — I69351 Hemiplegia and hemiparesis following cerebral infarction affecting right dominant side: Secondary | ICD-10-CM | POA: Diagnosis not present

## 2024-02-05 DIAGNOSIS — L0390 Cellulitis, unspecified: Secondary | ICD-10-CM | POA: Diagnosis not present

## 2024-02-05 DIAGNOSIS — A419 Sepsis, unspecified organism: Secondary | ICD-10-CM

## 2024-02-05 LAB — GLUCOSE, CAPILLARY
Glucose-Capillary: 237 mg/dL — ABNORMAL HIGH (ref 70–99)
Glucose-Capillary: 246 mg/dL — ABNORMAL HIGH (ref 70–99)
Glucose-Capillary: 250 mg/dL — ABNORMAL HIGH (ref 70–99)
Glucose-Capillary: 255 mg/dL — ABNORMAL HIGH (ref 70–99)
Glucose-Capillary: 257 mg/dL — ABNORMAL HIGH (ref 70–99)
Glucose-Capillary: 264 mg/dL — ABNORMAL HIGH (ref 70–99)
Glucose-Capillary: 264 mg/dL — ABNORMAL HIGH (ref 70–99)

## 2024-02-05 LAB — URINE CULTURE: Culture: NO GROWTH

## 2024-02-05 LAB — MRSA NEXT GEN BY PCR, NASAL: MRSA by PCR Next Gen: NOT DETECTED

## 2024-02-05 NOTE — Assessment & Plan Note (Addendum)
 02/05/24 Body mass index is 43.59 kg/m.

## 2024-02-05 NOTE — Assessment & Plan Note (Signed)
 02/05/24 no new stroke on MRI brain. He has chronic right sided weakness due to old prior CVA.  02/06/24 chronic. Resolved. No new CVA.

## 2024-02-05 NOTE — Assessment & Plan Note (Addendum)
 02/05/24 on IV rocephin/vanco. Surgery consulted. May be able to take to OR for I&D. Still febrile.  02/06/24 s/p I&D of right thigh abscess. Prn oxycodone, IV dilaudid for pain. On IV rocephin. Awaiting abscess culture data. Surgery following

## 2024-02-05 NOTE — Inpatient Diabetes Management (Signed)
 Inpatient Diabetes Program Recommendations  AACE/ADA: New Consensus Statement on Inpatient Glycemic Control   Target Ranges:  Prepandial:   less than 140 mg/dL      Peak postprandial:   less than 180 mg/dL (1-2 hours)      Critically ill patients:  140 - 180 mg/dL    Latest Reference Range & Units 02/04/24 08:10 02/04/24 12:08 02/04/24 15:55 02/04/24 23:50 02/05/24 04:40 02/05/24 07:48  Glucose-Capillary 70 - 99 mg/dL 738 (H) 789 (H) 803 (H) 257 (H) 246 (H) 255 (H)   Review of Glycemic Control  Diabetes history: DM2 Outpatient Diabetes medications: Farxiga 10 mg daily, Metformin  1000 mg BID, Actos  30 mg daily, Trulicity  0.75 mg Qweek Current orders for Inpatient glycemic control: Novolog  0-9 units Q4H  Inpatient Diabetes Program Recommendations:    Insulin : Please consider ordering Lantus  15 units Q24H starting now.  Thanks, Earnie Gainer, RN, MSN, CDCES Diabetes Coordinator Inpatient Diabetes Program [phone] (Team Pager from 8am to 5pm)

## 2024-02-05 NOTE — Assessment & Plan Note (Addendum)
 02/05/24 continue losartan  and nifedipine.  02/06/24 stable on losartan  and nifedipine.

## 2024-02-05 NOTE — Subjective & Objective (Signed)
 Pt seen and examined. Taken to OR today for I&D of right thigh abscess. Still having fevers. On IV Rocephin

## 2024-02-05 NOTE — Plan of Care (Signed)
  Problem: Education: Goal: Ability to describe self-care measures that may prevent or decrease complications (Diabetes Survival Skills Education) will improve Outcome: Progressing   Problem: Education: Goal: Individualized Educational Video(s) Outcome: Progressing   Problem: Coping: Goal: Ability to adjust to condition or change in health will improve Outcome: Progressing

## 2024-02-05 NOTE — Progress Notes (Signed)
 PROGRESS NOTE    Christopher Roberson  FMW:[id number] DOB: 1975/05/01 DOA: 02/03/2024 PCP: [facility] [address]  Subjective: Pt seen and examined. Met with pt and wife [name] at bedside. MRI brain fortunately negative for new CVA. Surgery has seen patient about right thigh abscess. Has been present since Saturday. Now draining some. Surgery thinks maybe I&D today or tomorrow. Still febrile. On IV ABX with rocephin/vanco.   Hospital Course: CC: lightheaded, facial twitching, right side numbness HPI: Christopher Roberson is a 48 y.o. 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  LKW 1930.  CT head showing no acute intracranial abnormality.  CTA head and neck negative for LVO. Seen by teleneurology and they are suspecting stroke recrudescence phenomenon versus recurrent ischemic stroke.  Recommend admission for stroke workup.  Patient presented to the ED with fever of 101.1 F and heart rate of 106.  Blood pressure initially elevated to 201/97.  Patient noted to have cellulitis of his right upper leg/inner thigh.  Labs notable for WBC count 17.4, glucose 259, T. bili 1.5, transaminases and alkaline phosphatase normal, ethanol level <15, COVID/influenza/RSV PCR negative, UDS negative, UA not suggestive of infection.  CT of right lower extremity showing findings consistent with cellulitis and a small subcutaneous abscess measuring 2.4 cm. Patient was given Tylenol , vancomycin, and ceftriaxone.  TRH called to admit.   Patient reports history of 2 previous strokes which left him with mild right-sided weakness.  Tonight around 7 PM he had worsening right upper and lower extremity weakness and numbness.  Symptoms have now improved.  He is also complaining of right upper thigh redness, swelling, and pain which has  gotten progressively worse over the past 3 days and now having fevers.  He has not noticed any pus drainage from this area.  Patient states just before he came into the emergency room, his urine was dark orange in color.  Denies any pain with urination.  Denies abdominal pain, nausea, or vomiting.  Denies cough, shortness of breath, or chest pain.  Significant Events: Admitted 02/03/2024 for right sided weakness/numbness   Admission Labs: WBC 17.4, HgB 13, plt 309 INR 1.1 Na 137, K 3.7, CO2 of 29, BUN 12, Scr 1.05 T. Prot 8.2, AST 19, ALT 15, alk phos 78, T. Bili 1.5 ETOH <15 Covid/rsv/flu negative UDS negative UA spg <1.005, glu >500, small HgB, negative nitrite, negative LE  Admission Imaging Studies: CT head No acute intracranial abnormality. ASPECTS 10.  CTA head/neck No large vessel occlusion, hemodynamically significant stenosis, or aneurysm in the head or neck. CT right leg Subcutaneous abscess with significant surrounding edema skin thickening. The air-fluid collection measures approximately 2.4 cm. MRI brain No acute intracranial abnormality. 2. Progressed but chronic small infarcts in the left cerebellar hemisphere.Stable mild but age-advanced white matter changes suggestive of chronic small vessel disease  Significant Labs: T. Chol 154, Trig 73, HDL 67, LEL 73 A1c 7.2% HIV non-reactive  Significant Imaging Studies:   Antibiotic Therapy: Anti-infectives (From admission, onward)    Start     Dose/Rate Route Frequency Ordered Stop   02/04/24 2200  cefTRIAXone (ROCEPHIN) 2 g in sodium chloride  0.9 % 100 mL IVPB        2 g 200 mL/hr over 30 Minutes Intravenous Every 24 hours 02/04/24 0334     02/04/24 0600  vancomycin (VANCOREADY) IVPB 1250 mg/250 mL  1,250 mg 166.7 mL/hr over 90 Minutes Intravenous Every 12 hours 02/04/24 0352     02/04/24 0030  vancomycin (VANCOCIN) IVPB 1000 mg/200 mL premix        1,000 mg 200 mL/hr over 60 Minutes Intravenous  Once 02/04/24  0017 02/04/24 0150   02/03/24 2245  cefTRIAXone (ROCEPHIN) 2 g in sodium chloride  0.9 % 100 mL IVPB        2 g 200 mL/hr over 30 Minutes Intravenous  Once 02/03/24 2230 02/03/24 2333       Procedures:   Consultants: Neurology General surgery    Assessment and Plan: * Right sided weakness 02/05/24 no new stroke on MRI brain. He has chronic right sided weakness due to old prior CVA.   Abscess of right thigh 02/05/24 on IV rocephin/vanco. Surgery consulted. May be able to take to OR for I&D. Still febrile.   Sepsis due to cellulitis (HCC) 02/05/24 present on admission. Cellulitis and abscess right thigh. WBC 17.4, fever of 101.4, abscess noted on CT right leg.   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   Obesity, Class III, BMI 40-49.9 (morbid obesity) (HCC) 02/05/24 Body mass index is 43.59 kg/m.    Essential hypertension 02/05/24 continue losartan  and nifedipine.   Type 2 diabetes mellitus with hyperglycemia, with long-term current use of insulin  (HCC) 02/05/24 on SSI.   DVT prophylaxis: SCD's Start: 02/04/24 0319    Code Status: Full Code Family Communication: discussed with pt and wife tamika at bedside Disposition Plan: return home Reason for continuing need for hospitalization: going to OR for I&D today or tomorrow.  Objective: Vitals:   02/04/24 2350 02/05/24 0400 02/05/24 0751 02/05/24 1159  BP: (!) 128/58 (!) 124/59 (!) 140/83 113/64  Pulse: 92 100 (!) 102 81  Resp:   18 18  Temp:  (!) 100.5 F (38.1 C) (!) 101.8 F (38.8 C) 98.7 F (37.1 C)  TempSrc:  Oral    SpO2: 96% 96% 94% 93%  Weight: 130 kg     Height: 5' 7.99 (1.727 m)      No intake or output data in the 24 hours ending 02/05/24 1231 Filed Weights   02/04/24 2350  Weight: 130 kg    Examination:  Physical Exam Vitals and nursing note reviewed.  Constitutional:      General: He is not in acute distress.    Appearance:  He is obese. He is not toxic-appearing.  HENT:     Head: Normocephalic and atraumatic.  Cardiovascular:     Rate and Rhythm: Normal rate and regular rhythm.  Pulmonary:     Effort: Pulmonary effort is normal. No respiratory distress.     Breath sounds: Normal breath sounds.  Abdominal:     General: Bowel sounds are normal.     Palpations: Abdomen is soft.  Skin:    General: Skin is warm and dry.     Capillary Refill: Capillary refill takes less than 2 seconds.     Findings: Erythema present.     Comments: Erythema, tenderness in right inner thigh where abscess is located on CT right LE.  Neurological:     Mental Status: He is alert and oriented to person, place, and time.     Data Reviewed: I have personally reviewed following labs and imaging studies  CBC: Recent Labs  Lab 02/03/24 2155 02/03/24 2222 02/04/24 0407  WBC 17.4*  --  16.5*  NEUTROABS 14.5*  --   --  HGB 13.0 13.3 12.3*  HCT 40.1 39.0 38.0*  MCV 89.7  --  89.4  PLT 309  --  278   Basic Metabolic Panel: Recent Labs  Lab 02/03/24 2155 02/03/24 2222 02/04/24 0407  NA 137 136 135  K 3.7 3.5 3.6  CL 97* 100 100  CO2 29  --  26  GLUCOSE 259* 278* 321*  BUN 12 12 12   CREATININE 1.05 0.90 0.80  CALCIUM  10.0  --  9.3   GFR: Estimated Creatinine Clearance: 146.9 mL/min (by C-G formula based on SCr of 0.8 mg/dL). Liver Function Tests: Recent Labs  Lab 02/03/24 2155 02/04/24 0407  AST 19 21  ALT 15 15  ALKPHOS 78 75  BILITOT 1.5* 0.9  PROT 8.2* 6.8  ALBUMIN 4.3 3.7   Coagulation Profile: Recent Labs  Lab 02/03/24 2155  INR 1.1   HbA1C: Recent Labs    02/04/24 0407  HGBA1C 7.2*   CBG: Recent Labs  Lab 02/04/24 1555 02/04/24 2350 02/05/24 0440 02/05/24 0748 02/05/24 1157  GLUCAP 196* 257* 246* 255* 237*   Lipid Profile: Recent Labs    02/04/24 0407  CHOL 154  HDL 67  LDLCALC 73  TRIG 73  CHOLHDL 2.3   Sepsis Labs: Recent Labs  Lab 02/04/24 0407  LATICACIDVEN 1.0     Recent Results (from the past 240 hours)  Resp panel by RT-PCR (RSV, Flu A&B, Covid) Anterior Nasal Swab     Status: None   Collection Time: 02/03/24 10:19 PM   Specimen: Anterior Nasal Swab  Result Value Ref Range Status   SARS Coronavirus 2 by RT PCR NEGATIVE NEGATIVE Final    Comment: (NOTE) SARS-CoV-2 target nucleic acids are NOT DETECTED.  The SARS-CoV-2 RNA is generally detectable in upper respiratory specimens during the acute phase of infection. The lowest concentration of SARS-CoV-2 viral copies this assay can detect is 138 copies/mL. A negative result does not preclude SARS-Cov-2 infection and should not be used as the sole basis for treatment or other patient management decisions. A negative result may occur with  improper specimen collection/handling, submission of specimen other than nasopharyngeal swab, presence of viral mutation(s) within the areas targeted by this assay, and inadequate number of viral copies(<138 copies/mL). A negative result must be combined with clinical observations, patient history, and epidemiological information. The expected result is Negative.  Fact Sheet for Patients:  BloggerCourse.com  Fact Sheet for Healthcare Providers:  SeriousBroker.it  This test is no t yet approved or cleared by the [place]  FDA and  has been authorized for detection and/or diagnosis of SARS-CoV-2 by FDA under an Emergency Use Authorization (EUA). This EUA will remain  in effect (meaning this test can be used) for the duration of the COVID-19 declaration under Section 564(b)(1) of the Act, 21 U.S.C.section 360bbb-3(b)(1), unless the authorization is terminated  or revoked sooner.       Influenza A by PCR NEGATIVE NEGATIVE Final   Influenza B by PCR NEGATIVE NEGATIVE Final    Comment: (NOTE) The Xpert Xpress SARS-CoV-2/FLU/RSV plus assay is intended as an aid in the diagnosis of influenza from  Nasopharyngeal swab specimens and should not be used as a sole basis for treatment. Nasal washings and aspirates are unacceptable for Xpert Xpress SARS-CoV-2/FLU/RSV testing.  Fact Sheet for Patients: BloggerCourse.com  Fact Sheet for Healthcare Providers: SeriousBroker.it  This test is not yet approved or cleared by the [place]  FDA and has been authorized for detection and/or diagnosis of SARS-CoV-2 by FDA under  an Emergency Use Authorization (EUA). This EUA will remain in effect (meaning this test can be used) for the duration of the COVID-19 declaration under Section 564(b)(1) of the Act, 21 U.S.C. section 360bbb-3(b)(1), unless the authorization is terminated or revoked.     Resp Syncytial Virus by PCR NEGATIVE NEGATIVE Final    Comment: (NOTE) Fact Sheet for Patients: BloggerCourse.com  Fact Sheet for Healthcare Providers: SeriousBroker.it  This test is not yet approved or cleared by the [place]  FDA and has been authorized for detection and/or diagnosis of SARS-CoV-2 by FDA under an Emergency Use Authorization (EUA). This EUA will remain in effect (meaning this test can be used) for the duration of the COVID-19 declaration under Section 564(b)(1) of the Act, 21 U.S.C. section 360bbb-3(b)(1), unless the authorization is terminated or revoked.  Performed at [facility], [address], [place], [place] [address]   Urine Culture     Status: None   Collection Time: 02/04/24 12:25 AM   Specimen: Urine, Clean Catch  Result Value Ref Range Status   Specimen Description   Final    URINE, CLEAN CATCH Performed at [facility], [address] [place], [place] [address]    Special Requests   Final    NONE Performed at [facility], [address] [place], [place] [address]    Culture   Final    NO  GROWTH Performed at [facility], [address], [place], [place] [address]    Report Status 02/05/2024 FINAL  Final  Culture, blood (Routine X 2) w Reflex to ID Panel     Status: None (Preliminary result)   Collection Time: 02/04/24  9:28 PM   Specimen: BLOOD LEFT HAND  Result Value Ref Range Status   Specimen Description BLOOD LEFT HAND  Final   Special Requests   Final    BOTTLES DRAWN AEROBIC AND ANAEROBIC Blood Culture adequate volume   Culture   Final    NO GROWTH < 12 HOURS Performed at [facility], [address] [place], [place] [address]    Report Status PENDING  Incomplete  Culture, blood (Routine X 2) w Reflex to ID Panel     Status: None (Preliminary result)   Collection Time: 02/04/24  9:28 PM   Specimen: BLOOD RIGHT HAND  Result Value Ref Range Status   Specimen Description BLOOD RIGHT HAND  Final   Special Requests   Final    BOTTLES DRAWN AEROBIC AND ANAEROBIC Blood Culture adequate volume   Culture   Final    NO GROWTH < 12 HOURS Performed at [facility], [address] [place], [place] [address]    Report Status PENDING  Incomplete     Radiology Studies: ECHOCARDIOGRAM COMPLETE BUBBLE STUDY Result Date: 02/04/2024    ECHOCARDIOGRAM REPORT   Patient Name:   LEVELL TAVANO Date of Exam: 02/04/2024 Medical Rec #:  [id number]      Height:       68.0 in Accession #:    [id number]     Weight:       284.0 lb Date of Birth:  31-Dec-1975      BSA:          2.372 m Patient Age:    48 years       BP:           142/87 mmHg Patient Gender: M  HR:           81 bpm. Exam Location:  Inpatient Procedure: 2D Echo, Cardiac Doppler, Color Doppler and Saline Contrast Bubble            Study (Both Spectral and Color Flow Doppler were utilized during            procedure). Indications:    Stroke  History:        Patient has prior history of Echocardiogram examinations, most                 recent 04/25/2022. Stroke; Risk Factors:Sleep Apnea, Hypertension,                  Diabetes, Obesity and Non-Smoker.  Sonographer:    Logan Shove RDCS Referring Phys: [id number] VASUNDHRA RATHORE IMPRESSIONS  1. Left ventricular ejection fraction, by estimation, is 65 to 70%. The left ventricle has normal function. The left ventricle has no regional wall motion abnormalities. There is moderate concentric left ventricular hypertrophy. Left ventricular diastolic parameters are consistent with Grade II diastolic dysfunction (pseudonormalization). Elevated left atrial pressure.  2. Right ventricular systolic function is normal. The right ventricular size is normal. Tricuspid regurgitation signal is inadequate for assessing PA pressure.  3. Left atrial size was moderately dilated.  4. The mitral valve is normal in structure. No evidence of mitral valve regurgitation. No evidence of mitral stenosis.  5. The aortic valve is tricuspid. Aortic valve regurgitation is not visualized. No aortic stenosis is present.  6. The inferior vena cava is normal in size with greater than 50% respiratory variability, suggesting right atrial pressure of 3 mmHg.  7. Agitated saline contrast bubble study was negative, with no evidence of any interatrial shunt. Comparison(s): No significant change from prior study. Prior images reviewed side by side. FINDINGS  Left Ventricle: Left ventricular ejection fraction, by estimation, is 65 to 70%. The left ventricle has normal function. The left ventricle has no regional wall motion abnormalities. The left ventricular internal cavity size was normal in size. There is  moderate concentric left ventricular hypertrophy. Left ventricular diastolic parameters are consistent with Grade II diastolic dysfunction (pseudonormalization). Elevated left atrial pressure. Right Ventricle: The right ventricular size is normal. No increase in right ventricular wall thickness. Right ventricular systolic function is normal. Tricuspid regurgitation signal is inadequate for assessing PA  pressure. Left Atrium: Left atrial size was moderately dilated. Right Atrium: Right atrial size was normal in size. Pericardium: There is no evidence of pericardial effusion. Mitral Valve: The mitral valve is normal in structure. No evidence of mitral valve regurgitation. No evidence of mitral valve stenosis. Tricuspid Valve: The tricuspid valve is normal in structure. Tricuspid valve regurgitation is trivial. Aortic Valve: The aortic valve is tricuspid. Aortic valve regurgitation is not visualized. No aortic stenosis is present. Aortic valve peak gradient measures 7.4 mmHg. Pulmonic Valve: The pulmonic valve was normal in structure. Pulmonic valve regurgitation is not visualized. No evidence of pulmonic stenosis. Aorta: The aortic root and ascending aorta are structurally normal, with no evidence of dilitation. Venous: The inferior vena cava is normal in size with greater than 50% respiratory variability, suggesting right atrial pressure of 3 mmHg. IAS/Shunts: No atrial level shunt detected by color flow Doppler. Agitated saline contrast was given intravenously to evaluate for intracardiac shunting. Agitated saline contrast bubble study was negative, with no evidence of any interatrial shunt.  LEFT VENTRICLE PLAX 2D LVIDd:         5.10 cm  Diastology LVIDs:         3.40 cm      LV e' medial:    4.90 cm/s LV PW:         1.40 cm      LV E/e' medial:  16.2 LV IVS:        1.40 cm      LV e' lateral:   5.11 cm/s LVOT diam:     2.20 cm      LV E/e' lateral: 15.6 LVOT Area:     3.80 cm  LV Volumes (MOD) LV vol d, MOD A2C: 180.0 ml LV vol d, MOD A4C: 198.0 ml LV vol s, MOD A2C: 69.6 ml LV vol s, MOD A4C: 64.1 ml LV SV MOD A2C:     110.4 ml LV SV MOD A4C:     198.0 ml LV SV MOD BP:      121.4 ml RIGHT VENTRICLE             IVC RV Basal diam:  3.90 cm     IVC diam: 1.70 cm RV S prime:     15.60 cm/s TAPSE (M-mode): 1.8 cm LEFT ATRIUM              Index        RIGHT ATRIUM           Index LA diam:        4.40 cm  1.85  cm/m   RA Area:     18.80 cm LA Vol (A2C):   125.0 ml 52.69 ml/m  RA Volume:   55.60 ml  23.44 ml/m LA Vol (A4C):   59.1 ml  24.91 ml/m LA Biplane Vol: 89.1 ml  37.56 ml/m  AORTIC VALVE AV Area (Vmax): 3.66 cm AV Vmax:        136.00 cm/s AV Peak Grad:   7.4 mmHg LVOT Vmax:      131.00 cm/s  AORTA Ao Root diam: 3.30 cm Ao Asc diam:  3.80 cm MITRAL VALVE MV Area (PHT): 3.99 cm    SHUNTS MV Decel Time: 190 msec    Systemic Diam: 2.20 cm MV E velocity: 79.60 cm/s MV A velocity: 82.60 cm/s MV E/A ratio:  0.96 Mihai Croitoru MD Electronically signed by Jerel Balding MD Signature Date/Time: 02/04/2024/2:40:32 PM    Final    MR BRAIN WO CONTRAST Result Date: 02/04/2024 EXAM: MR Brain without Intravenous Contrast. CLINICAL HISTORY: 48 year old male. Code Stroke presentation yesterday. Neuro deficit, acute, stroke suspected. TECHNIQUE: Magnetic resonance images of the brain without intravenous contrast in multiple planes. CONTRAST: Without. COMPARISON: CT head and CTA head and neck 02/03/2024. Brain MRI 05/07/2022. FINDINGS: BRAIN: No restricted diffusion to indicate acute infarction. No intracranial mass or hemorrhage. No midline shift or extra-axial fluid collection. No cerebellar tonsillar ectopia. The central arterial and venous flow voids are patent. Major vascular flow voids appear stable from last year and negative. Linear and small chronic infarcts in the left cerebellar hemisphere are new or increased from last year but chronic (series 8 image 9). Background brain volume remains normal. Scattered small but age advanced white matter T2 and FLAIR hyperintensity in both hemispheres, especially the left corona radiata this is stable, nonspecific, but suggestive of chronic small vessel disease. No cortical encephalomalacia identified. Deep gray nuclei and brainstem remain normal. VENTRICLES: No hydrocephalus. ORBITS: The orbits are normal. SINUSES AND MASTOIDS: The sinuses and mastoid air cells are clear.  BONES: No acute fracture or focal osseous  lesion. IMPRESSION: 1. No acute intracranial abnormality. 2. Progressed but chronic small infarcts in the left cerebellar hemisphere. Stable mild but age-advanced white matter changes suggestive of chronic small vessel disease. Electronically signed by: Helayne Hurst MD 02/04/2024 10:26 AM EDT RP Workstation: [id number]   CT ANGIO HEAD NECK W WO CM Result Date: 02/03/2024 EXAM: CTA HEAD AND NECK WITHOUT AND WITH IV CONTRAST 02/03/2024 10:51:54 PM TECHNIQUE: CTA of the head and neck was performed without and with the administration of 75mL intravenous contrast (iohexol  (OMNIPAQUE ) 350 MG/ML injection 75 mL IOHEXOL  350 MG/ML SOLN). Multiplanar 2D and/or 3D reformatted images are provided for review. Automated exposure control, iterative reconstruction, and/or weight based adjustment of the mA/kV was utilized to reduce the radiation dose to as low as reasonably achievable. Stenosis of the internal carotid arteries measured using NASCET criteria. COMPARISON: None available CLINICAL HISTORY: Stroke suspected (Ped 0-17y); right hemiparesis. FINDINGS: AORTIC ARCH AND ARCH VESSELS: No dissection or arterial injury. No significant stenosis of the brachiocephalic or subclavian arteries. CERVICAL CAROTID ARTERIES: No dissection, arterial injury, or hemodynamically significant stenosis by NASCET criteria. CERVICAL VERTEBRAL ARTERIES: No dissection, arterial injury, or significant stenosis. LUNGS AND MEDIASTINUM: Unremarkable. SOFT TISSUES: No acute abnormality. BONES: No acute abnormality. ANTERIOR CIRCULATION: No significant stenosis of the internal carotid arteries. No significant stenosis of the anterior cerebral arteries. No significant stenosis of the middle cerebral arteries. No aneurysm. POSTERIOR CIRCULATION: No significant stenosis of the posterior cerebral arteries. No significant stenosis of the basilar artery. No significant stenosis of the vertebral arteries. No aneurysm.  OTHER: No dural venous sinus thrombosis on this non-dedicated study. IMPRESSION: 1. No large vessel occlusion, hemodynamically significant stenosis, or aneurysm in the head or neck. Electronically signed by: Gilmore Molt MD 02/03/2024 11:14 PM EDT RP Workstation: [id number]   CT EXTREMITY LOWER RIGHT W CONTRAST Result Date: 02/03/2024 CLINICAL DATA:  Soft tissue swelling in the right thigh EXAM: CT OF THE LOWER RIGHT EXTREMITY WITH CONTRAST TECHNIQUE: Multidetector CT imaging of the lower right extremity was performed according to the standard protocol following intravenous contrast administration. RADIATION DOSE REDUCTION: This exam was performed according to the departmental dose-optimization program which includes automated exposure control, adjustment of the mA and/or kV according to patient size and/or use of iterative reconstruction technique. CONTRAST:  75mL OMNIPAQUE  IOHEXOL  350 MG/ML SOLN COMPARISON:  None Available. FINDINGS: Bones/Joint/Cartilage Right femur is well visualized and within normal limits. No acute fracture is noted. Ligaments Suboptimally assessed by CT. Muscles and Tendons Musculature of the right thigh appears within normal limits. No focal hematoma is noted. Soft tissues Considerable subcutaneous edema is identified within the anteromedial aspect of the right thigh. Overlying skin thickening is noted consistent with cellulitis. Foci of air are noted superiorly as well as a small air-fluid collection which measures approximately 2.4 cm in greatest dimension consistent with a small abscess. Reactive adenopathy in the right inguinal region is seen. No other soft tissue abnormality is noted. IMPRESSION: Subcutaneous abscess with significant surrounding edema skin thickening. The air-fluid collection measures approximately 2.4 cm. Electronically Signed   By: Oneil Devonshire M.D.   On: 02/03/2024 23:12   CT HEAD CODE STROKE WO CONTRAST (LKW 0-4.5h, LVO 0-24h) Result Date:  02/03/2024 EXAM: CT HEAD WITHOUT CONTRAST 02/03/2024 09:59:12 PM TECHNIQUE: CT of the head was performed without the administration of intravenous contrast. Automated exposure control, iterative reconstruction, and/or weight based adjustment of the mA/kV was utilized to reduce the radiation dose to as low as reasonably achievable. COMPARISON: None available. CLINICAL  HISTORY: Neuro deficit, acute, stroke suspected. FINDINGS: BRAIN AND VENTRICLES: No acute hemorrhage. No evidence of acute infarct. No hydrocephalus. No extra-axial collection. No mass effect or midline shift. ORBITS: No acute abnormality. SINUSES: No acute abnormality. SOFT TISSUES AND SKULL: No acute soft tissue abnormality. No skull fracture. IMPRESSION: 1. No acute intracranial abnormality.  ASPECTS 10. Findings discussed with Dr. Zammit via telephone at 10:17 PM Electronically signed by: Gilmore Molt MD 02/03/2024 10:19 PM EDT RP Workstation: [id number]    Scheduled Meds:   stroke: early stages of recovery book   Does not apply Once   atorvastatin   80 mg Oral QPM   ezetimibe  10 mg Oral QPM   hydrALAZINE   50 mg Oral BID   insulin  aspart  0-9 Units Subcutaneous Q4H   losartan   100 mg Oral Daily   NIFEdipine  90 mg Oral Daily   pantoprazole   40 mg Oral Daily   sertraline   100 mg Oral Daily   traZODone   50 mg Oral QHS   Continuous Infusions:  cefTRIAXone (ROCEPHIN)  IV 2 g (02/04/24 2259)   vancomycin 1,250 mg (02/05/24 0551)     LOS: 1 day   Time spent: 55 minutes  Camellia Door, DO  [facility]  02/05/2024, 12:31 PM

## 2024-02-05 NOTE — Consult Note (Addendum)
 NEUROLOGY CONSULT NOTE   Date of service: February 05, 2024 Patient Name: Christopher Roberson MRN:  [id number] DOB:  20-May-1975 Chief Complaint: Right side weakness and numbness Requesting Provider: Laurence Locus, DO  History of Present Illness  Christopher Roberson is a 48 y.o. male with hx of previous stroke with residual right side weakness, HTN, HLD, DM, depression, OSA and obesity who presented to the Ed for evaluation of worsening weakness in his right arm and leg with numbness. Reports that someone also pointed out his face was drooping. Patient was evaluated by [facility] neurologist. Code stroke CT head with no acute process. CTA head and neck No LVO. MRI brain with no acute process. It was recommended he have a stroke workup and was transferred to [facility] .  Patient states he developed right side weakness and numbness that lasted just a little while. He states that he has a boil on his right leg and it was causing him pain and believes that is the reason for his symptoms.He states he feels back to baseline and numbness is gone.   In the Ed he was febrile 101, HR 106. Labs with WBC 17.4 glucose 259, UDS and UA negative. CT of right lower extremity is consistent with cellulitis and a small subcutaneous abscess. He was started on antibiotics.   LKW: 1930 Modified rankin score: 1-No significant post stroke disability and can perform usual duties with stroke symptoms IV Thrombolysis:  No not a stroke  EVT:  No LVO    NIHSS components Score: Comment  1a Level of Conscious 0[]  1[]  2[]  3[]      1b LOC Questions 0[]  1[]  2[]       1c LOC Commands 0[]  1[]  2[]       2 Best Gaze 0[]  1[]  2[]       3 Visual 0[]  1[]  2[]  3[]      4 Facial Palsy 0[]  1[]  2[]  3[]      5a Motor Arm - left 0[]  1[]  2[]  3[]  4[]  UN[]    5b Motor Arm - Right 0[]  1[]  2[]  3[]  4[]  UN[]    6a Motor Leg - Left 0[]  1[]  2[]  3[]  4[]  UN[]    6b Motor Leg - Right 0[]  1[]  2[]  3[]  4[]  UN[]    7 Limb Ataxia 0[]  1[]  2[]  UN[]      8 Sensory 0[]  1[]  2[]  UN[]      9  Best Language 0[]  1[]  2[]  3[]      10 Dysarthria 0[]  1[]  2[]  UN[]      11 Extinct. and Inattention 0[]  1[]  2[]       TOTAL: 0      ROS  Comprehensive ROS performed and pertinent positives documented in HPI    Past History   Past Medical History:  Diagnosis Date   Depression    Diabetes mellitus    Hypertension    Obesity    Sleep apnea    On CPAP machine   Stroke [facility])     Past Surgical History:  Procedure Laterality Date   APPENDECTOMY     CARPAL TUNNEL RELEASE     ESOPHAGOGASTRODUODENOSCOPY     [facility] around 2015-2016   HERNIA REPAIR      Family History: Family History  Problem Relation Age of Onset   Heart disease Mother    Colon cancer Father    Cancer Paternal Grandmother    Prostate cancer Paternal Grandfather    Colon cancer Paternal Uncle    Prostate cancer Paternal Uncle    Prostate cancer Paternal Uncle  Esophageal cancer Neg Hx    Rectal cancer Neg Hx    Stomach cancer Neg Hx     Social History  reports that he has never smoked. He has never used smokeless tobacco. He reports that he does not drink alcohol and does not use drugs.  Allergies  Allergen Reactions   Percocet [Oxycodone-Acetaminophen ] Itching    Can take if takes with Benadryl     Medications   Current Facility-Administered Medications:     stroke: early stages of recovery book, , Does not apply, Once, Alfornia Madison, MD   acetaminophen  (TYLENOL ) tablet 650 mg, 650 mg, Oral, Q6H PRN, Sigdel, Santosh, MD, 650 mg at 02/04/24 1926   atorvastatin  (LIPITOR ) tablet 80 mg, 80 mg, Oral, QPM, Rathore, Vasundhra, MD, 80 mg at 02/04/24 1859   cefTRIAXone (ROCEPHIN) 2 g in sodium chloride  0.9 % 100 mL IVPB, 2 g, Intravenous, Q24H, Rathore, Vasundhra, MD, Last Rate: 200 mL/hr at 02/04/24 2259, 2 g at 02/04/24 2259   clopidogrel  (PLAVIX ) tablet 75 mg, 75 mg, Oral, Daily, Sigdel, Santosh, MD, 75 mg at 02/04/24 1556   ezetimibe (ZETIA) tablet 10 mg, 10 mg, Oral, QPM, Rathore,  Vasundhra, MD, 10 mg at 02/04/24 1859   hydrALAZINE  (APRESOLINE ) tablet 50 mg, 50 mg, Oral, BID, Sigdel, Santosh, MD, 50 mg at 02/04/24 2255   insulin  aspart (novoLOG ) injection 0-9 Units, 0-9 Units, Subcutaneous, Q4H, Rathore, Vasundhra, MD, 3 Units at 02/05/24 9447   losartan  (COZAAR ) tablet 100 mg, 100 mg, Oral, Daily, Sigdel, Santosh, MD, 100 mg at 02/04/24 1556   NIFEdipine (PROCARDIA-XL/NIFEDICAL-XL) 24 hr tablet 90 mg, 90 mg, Oral, Daily, Sigdel, Santosh, MD   pantoprazole  (PROTONIX ) EC tablet 40 mg, 40 mg, Oral, Daily, Rathore, Vasundhra, MD, 40 mg at 02/04/24 0935   sertraline  (ZOLOFT ) tablet 100 mg, 100 mg, Oral, Daily, Rathore, Vasundhra, MD, 100 mg at 02/04/24 0935   traZODone  (DESYREL ) tablet 50 mg, 50 mg, Oral, QHS, Rathore, Vasundhra, MD, 50 mg at 02/04/24 2255   vancomycin (VANCOREADY) IVPB 1250 mg/250 mL, 1,250 mg, Intravenous, Q12H, Brenton Alfonso HERO, [facility], Last Rate: 166.7 mL/hr at 02/05/24 0551, 1,250 mg at 02/05/24 0551  Vitals   Vitals:   02/04/24 2255 02/04/24 2350 02/05/24 0400 02/05/24 0751  BP:  (!) 128/58 (!) 124/59 (!) 140/83  Pulse: 81 92 100 (!) 102  Resp: 18   18  Temp:   (!) 100.5 F (38.1 C) (!) 101.8 F (38.8 C)  TempSrc:   Oral   SpO2: 96% 96% 96% 94%  Weight:  130 kg    Height:  5' 7.99 (1.727 m)      Body mass index is 43.59 kg/m.   Physical Exam   Constitutional: Appears well-developed and well-nourished.  Psych: Affect appropriate to situation.  Eyes: No scleral injection.  HENT: No OP obstruction.  Head: Normocephalic.  Cardiovascular: Normal rate and regular rhythm.  Respiratory: Effort normal, non-labored breathing.  GI: Soft.  No distension. There is no tenderness.  Skin: WDI.   Neurologic Examination   Mental Status -  Level of arousal and orientation to time, place, and person were intact. Language including expression, naming, repetition, comprehension was assessed and found intact. Attention span and concentration were  normal. Recent and remote memory were intact. Fund of Knowledge was assessed and was intact.  Cranial Nerves II - XII - II - Visual field intact OU. III, IV, VI - Extraocular movements intact. V - Facial sensation intact bilaterally. VII - Facial movement intact bilaterally. VIII - Hearing &  vestibular intact bilaterally. X - Palate elevates symmetrically. XI - Chin turning & shoulder shrug intact bilaterally. XII - Tongue protrusion intact.  Motor Strength - The patient's strength was normal in all extremities and pronator drift was absent.  Bulk was normal and fasciculations were absent.   Motor Tone - Muscle tone was assessed at the neck and appendages and was normal. Sensory - Light touch, temperature/pinprick were assessed and were symmetrical.   Coordination - The patient had normal movements in the hands and feet with no ataxia or dysmetria.  Tremor was absent. Gait and Station - deferred.  Labs/Imaging/Neurodiagnostic studies   CBC:  Recent Labs  Lab February 20, 2024 2155 2024/02/20 2222 02/04/24 0407  WBC 17.4*  --  16.5*  NEUTROABS 14.5*  --   --   HGB 13.0 13.3 12.3*  HCT 40.1 39.0 38.0*  MCV 89.7  --  89.4  PLT 309  --  278   Basic Metabolic Panel:  Lab Results  Component Value Date   NA 135 02/04/2024   K 3.6 02/04/2024   CO2 26 02/04/2024   GLUCOSE 321 (H) 02/04/2024   BUN 12 02/04/2024   CREATININE 0.80 02/04/2024   CALCIUM  9.3 02/04/2024   GFRNONAA >60 02/04/2024   GFRAA >60 10/10/2018   Lipid Panel:  Lab Results  Component Value Date   LDLCALC 73 02/04/2024   HgbA1c:  Lab Results  Component Value Date   HGBA1C 7.2 (H) 02/04/2024   Urine Drug Screen:     Component Value Date/Time   LABOPIA NEGATIVE 02/04/2024 0025   COCAINSCRNUR NEGATIVE 02/04/2024 0025   LABBENZ NEGATIVE 02/04/2024 0025   AMPHETMU NEGATIVE 02/04/2024 0025   THCU NEGATIVE 02/04/2024 0025   LABBARB NEGATIVE 02/04/2024 0025    Alcohol Level     Component Value Date/Time   ETH  <15 Feb 20, 2024 2155   INR  Lab Results  Component Value Date   INR 1.1 20-Feb-2024   APTT  Lab Results  Component Value Date   APTT 33 Feb 20, 2024   AED levels: No results found for: PHENYTOIN, ZONISAMIDE, LAMOTRIGINE, LEVETIRACETA  CT Head without contrast(Personally reviewed): No acute process. Aspects 10   CT angio Head and Neck with contrast(Personally reviewed): No LVO   MRI Brain(Personally reviewed): No acute process  Progressed but chronic small infarcts in the left cerebellar hemisphere. Stable mild but age-advanced white matter changes suggestive of chronic small vessel disease.  Labs:  LDL 73 A1c 7.2  2D echo EF 65-70%. Moderate LVH with grade II diastolic function. LA moderately dilated   ASSESSMENT   Christopher Roberson is a 48 y.o. male previous stroke with residual right side weakness, HTN, HLD, DM, depression, OSA and obesity who presented to the Ed for evaluation of worsening weakness in his right arm and leg with numbness. Reports that someone also pointed out his face was drooping.   CT/ CTA head and neck and MRI brain negative for acute process. Symptoms are completley resolved and likely recrudescence phenomenon in the setting of acute infection    RECOMMENDATIONS   - Frequent neuro checks - Continue home Plavix  - continue home atorvastatin  and zetia  - Risk factor modification - Telemetry monitoring - PT consult, OT consult, Speech consult -Neurology will sign off. Please call with questions or concerns  ______________________________________________________________________    Signed, Karna DELENA Geralds, NP [facility]  NEUROHOSPITALIST ADDENDUM Performed a face to face diagnostic evaluation.   I have reviewed the contents of history and physical exam as documented by PA/ARNP/Resident  and agree with above documentation.  I have discussed and formulated the above plan as documented. Edits to the note have been made as  needed.  Filippo Puls, MD [facility]

## 2024-02-05 NOTE — Evaluation (Signed)
 Occupational Therapy Evaluation and Discharge Patient Details Name: Christopher Roberson MRN: [id number] DOB: [birth year] Today's Date: 02/05/2024   History of Present Illness   Christopher Roberson is a 48 y.o. male admitted 02/03/24 with worsening R weakness/numbness from baseline.  CT and MRI negative for acute intracranial abnormality. Dx with cellulitis of his right upper leg/inner thigh. PMH includes DM2, HTN, hyperlipidemia, obesity, OSA on CPAP, previous stroke x 2 with residual right-sided weakness, depression.     Clinical Impressions Pt typically independent in ADL and mobility. Is a Production designer, theatre/television/film at [employer], lives with his brother but fiance is available for assistance if needed. Today was able to demonstrate transfers, LB and UB dressing, sink level grooming, in room mobility without assist or guidance from OT. Pt reports pain in RLE but functional and independent in ADL and mobility. Pt agrees he is at baseline and does not need further OT at this time. No questions at end of session. OT will sign off.      If plan is discharge home, recommend the following:         Functional Status Assessment   Patient has not had a recent decline in their functional status     Equipment Recommendations   None recommended by OT     Recommendations for Other Services         Precautions/Restrictions   Precautions Precautions: None Restrictions Weight Bearing Restrictions Per Provider Order: No     Mobility Bed Mobility Overal bed mobility: Independent                  Transfers Overall transfer level: Independent Equipment used: None                      Balance                                           ADL either performed or assessed with clinical judgement   ADL Overall ADL's : At baseline                                       General ADL Comments: Pt able to demonstrate UB and LB ADL, sink level grooming,  transfers without assist from therapist     Vision         Perception         Praxis         Pertinent Vitals/Pain Pain Assessment Pain Assessment: Faces Faces Pain Scale: Hurts a little bit Pain Location: RLE (abscess) Pain Descriptors / Indicators: Discomfort Pain Intervention(s): Monitored during session, Repositioned     Extremity/Trunk Assessment Upper Extremity Assessment Upper Extremity Assessment: Overall WFL for tasks assessed (R weakness from prior CVA (at baseline))   Lower Extremity Assessment Lower Extremity Assessment: Defer to PT evaluation       Communication Communication Communication: No apparent difficulties   Cognition Arousal: Alert Behavior During Therapy: [facility] for tasks assessed/performed                                 Following commands: Intact       Cueing  General Comments   Cueing Techniques: Verbal cues      Exercises  Shoulder Instructions      Home Living Family/patient expects to be discharged to:: Private residence Living Arrangements: Other relatives (brother) Available Help at Discharge: Family;Other (Comment) (fiance) Type of Home: House Home Access: Level entry     Home Layout: Two level Alternate Level Stairs-Number of Steps:  (flight) Alternate Level Stairs-Rails: Right Bathroom Shower/Tub: Chief Strategy Officer: Standard Bathroom Accessibility: Yes How Accessible: Accessible via walker Home Equipment: None   Additional Comments: Reports 1 recent fall down steps      Prior Functioning/Environment Prior Level of Function : Independent/Modified Independent             Mobility Comments: Production designer, theatre/television/film at [employer] ADLs Comments: independent per Pt    OT Problem List: Pain   OT Treatment/Interventions:        OT Goals(Current goals can be found in the care plan section)   Acute Rehab OT Goals Patient Stated Goal: help RLE feel better OT Goal Formulation:  With patient Time For Goal Achievement: 02/19/24 Potential to Achieve Goals: Good   OT Frequency:       Co-evaluation              AM-PAC OT 6 Clicks Daily Activity     Outcome Measure Help from another person eating meals?: None Help from another person taking care of personal grooming?: None Help from another person toileting, which includes using toliet, bedpan, or urinal?: None Help from another person bathing (including washing, rinsing, drying)?: None Help from another person to put on and taking off regular upper body clothing?: None Help from another person to put on and taking off regular lower body clothing?: None 6 Click Score: 24   End of Session Nurse Communication: Mobility status  Activity Tolerance: Patient tolerated treatment well Patient left: in chair;with call bell/phone within reach  OT Visit Diagnosis: Pain Pain - Right/Left: Right Pain - part of body: Leg                Time: 8897-8885 OT Time Calculation (min): 12 min Charges:  OT General Charges $OT Visit: 1 Visit OT Evaluation $OT Eval Low Complexity: 1 Low  Leita DEL OTR/L Acute Rehabilitation Services Office: [phone]  Leita JINNY Odea 02/05/2024, 1:35 PM

## 2024-02-05 NOTE — Evaluation (Signed)
 Physical Therapy Brief Evaluation and Discharge Note Patient Details Name: Christopher Roberson MRN: [id number] DOB: 11-17-1975 Today's Date: 02/05/2024   History of Present Illness  Christopher Roberson is a 48 y.o. male admitted 02/03/24 with worsening R weakness/numbness from baseline.  CT and MRI negative for acute intracranial abnormality. Dx with cellulitis of his right upper leg/inner thigh. PMH includes DM2, HTN, hyperlipidemia, obesity, OSA on CPAP, previous stroke x 2 with residual right-sided weakness, depression.  Clinical Impression  Patient evaluated by Physical Therapy with no further acute PT needs identified. Pt reports RLE pain, but otherwise appears to be back to his functional baseline. Pt ambulating 200 ft with no assistive device and negotiated 2 steps independently. BLE strength 5/5. Education reinforced regarding BEFAST stroke signs and symptoms. PTA, pt lives with his brother and works as a Production designer, theatre/television/film at [employer]. All education has been completed and the patient has no further questions. No follow-up Physical Therapy or equipment needs. PT is signing off. Thank you for this referral.        PT Assessment Patient does not need any further PT services  Assistance Needed at Discharge  None    Equipment Recommendations None recommended by PT  Recommendations for Other Services       Precautions/Restrictions Precautions Precautions: None Restrictions Weight Bearing Restrictions Per Provider Order: No        Mobility  Bed Mobility   Supine/Sidelying to sit: Independent      Transfers Overall transfer level: Independent Equipment used: None                    Ambulation/Gait Ambulation/Gait assistance: Independent Gait Distance (Feet): 200 Feet Assistive device: None Gait Pattern/deviations: WFL(Within Functional Limits)      Home Activity Instructions    Stairs Stairs: Yes Stairs assistance: Modified independent (Device/Increase  time) Stair Management: One rail Right Number of Stairs: 2    Modified Rankin (Stroke Patients Only)        Balance Overall balance assessment: Independent                        Pertinent Vitals/Pain PT - Brief Vital Signs All Vital Signs Stable: Yes Pain Assessment Pain Assessment: Faces Faces Pain Scale: Hurts a little bit Pain Location: RLE (abscess) Pain Descriptors / Indicators: Discomfort Pain Intervention(s): Monitored during session     Home Living Family/patient expects to be discharged to:: Private residence Living Arrangements: Other relatives (brother) Available Help at Discharge: Family;Friend(s) Home Environment: Level entry   Home Equipment: None   Additional Comments: Reports 1 recent fall down steps    Prior Function Level of Independence: Independent Comments: Works as Production designer, theatre/television/film at [employer]    UE/LE Assessment   UE ROM/Strength/Tone/Coordination: [employer]    LE ROM/Strength/Tone/Coordination: [employer]      Communication   Communication Communication: No apparent difficulties     Cognition Overall Cognitive Status: Appears within functional limits for tasks assessed/performed       General Comments      Exercises     Assessment/Plan    PT Problem List         PT Visit Diagnosis Other symptoms and signs involving the nervous system (R29.898)    No Skilled PT All education completed;Patient at baseline level of functioning;Patient is independent with all acitivity/mobility   Co-evaluation                AMPAC 6 Clicks Help needed turning from your back  to your side while in a flat bed without using bedrails?: None Help needed moving from lying on your back to sitting on the side of a flat bed without using bedrails?: None Help needed moving to and from a bed to a chair (including a wheelchair)?: None Help needed standing up from a chair using your arms (e.g., wheelchair or bedside chair)?: None Help needed to  walk in hospital room?: None Help needed climbing 3-5 steps with a railing? : None 6 Click Score: 24      End of Session   Activity Tolerance: Patient tolerated treatment well Patient left: in bed;with call bell/phone within reach;with family/visitor present Nurse Communication: Mobility status PT Visit Diagnosis: Other symptoms and signs involving the nervous system (R29.898)     Time: 0812-0828 PT Time Calculation (min) (ACUTE ONLY): 16 min  Charges:   PT Evaluation $PT Eval Low Complexity: 1 Low      Aleck Daring, PT, DPT Acute Rehabilitation Services Office [phone]   Aleck ONEIDA Daring  02/05/2024, 8:33 AM

## 2024-02-05 NOTE — Plan of Care (Signed)
  Problem: Education: Goal: Ability to describe self-care measures that may prevent or decrease complications (Diabetes Survival Skills Education) will improve 02/05/2024 0545 by Loria Zachary CROME, RN Outcome: Progressing 02/05/2024 0406 by Loria Zachary CROME, RN Outcome: Progressing   Problem: Education: Goal: Individualized Educational Video(s) 02/05/2024 0545 by Loria Zachary CROME, RN Outcome: Progressing 02/05/2024 0406 by Loria Zachary CROME, RN Outcome: Progressing   Problem: Coping: Goal: Ability to adjust to condition or change in health will improve 02/05/2024 0545 by Loria Zachary CROME, RN Outcome: Progressing 02/05/2024 0406 by Loria Zachary CROME, RN Outcome: Progressing   Problem: Fluid Volume: Goal: Ability to maintain a balanced intake and output will improve Outcome: Progressing

## 2024-02-05 NOTE — Progress Notes (Signed)
 SLP Cancellation Note  Patient Details Name: Christopher Roberson MRN: [id number] DOB: 1975/12/15   Cancelled treatment:       Reason Eval/Treat Not Completed: SLP screened, no needs identified, will sign off   Susa Bones, Consuelo Fitch 02/05/2024, 10:59 AM

## 2024-02-05 NOTE — Assessment & Plan Note (Addendum)
 02/05/24 present on admission. Cellulitis and abscess right thigh. WBC 17.4, fever of 101.4, abscess noted on CT right leg.

## 2024-02-05 NOTE — Assessment & Plan Note (Signed)
 02/05/24 chronic.  02/06/24 chronoic.

## 2024-02-05 NOTE — Hospital Course (Addendum)
 CC: lightheaded, facial twitching, right side numbness HPI: Christopher Roberson is a 48 y.o. 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  LKW 1930.  CT head showing no acute intracranial abnormality.  CTA head and neck negative for LVO. Seen by teleneurology and they are suspecting stroke recrudescence phenomenon versus recurrent ischemic stroke.  Recommend admission for stroke workup.  Patient presented to the ED with fever of 101.1 F and heart rate of 106.  Blood pressure initially elevated to 201/97.  Patient noted to have cellulitis of his right upper leg/inner thigh.  Labs notable for WBC count 17.4, glucose 259, T. bili 1.5, transaminases and alkaline phosphatase normal, ethanol level <15, COVID/influenza/RSV PCR negative, UDS negative, UA not suggestive of infection.  CT of right lower extremity showing findings consistent with cellulitis and a small subcutaneous abscess measuring 2.4 cm. Patient was given Tylenol , vancomycin, and ceftriaxone.  [facility] called to admit.   Patient reports history of 2 previous strokes which left him with mild right-sided weakness.  Tonight around 7 PM he had worsening right upper and lower extremity weakness and numbness.  Symptoms have now improved.  He is also complaining of right upper thigh redness, swelling, and pain which has gotten progressively worse over the past 3 days and now having fevers.  He has not noticed any pus drainage from this area.  Patient states just before he came into the emergency room, his urine was dark orange in color.  Denies any pain with urination.  Denies abdominal pain, nausea, or vomiting.  Denies cough, shortness of breath, or chest pain.  Significant Events: Admitted 02/03/2024 for right sided weakness/numbness 02-04-2024 general  surgery consulted for right thigh abscess. MRI brain negative for new CVA 02-06-2024 pt taken to OR for I&D of right thigh abscess  Admission Labs: WBC 17.4, HgB 13, plt 309 INR 1.1 Na 137, K 3.7, CO2 of 29, BUN 12, Scr 1.05 T. Prot 8.2, AST 19, ALT 15, alk phos 78, T. Bili 1.5 ETOH <15 Covid/rsv/flu negative UDS negative UA spg <1.005, glu >500, small HgB, negative nitrite, negative LE  Admission Imaging Studies: CT head No acute intracranial abnormality. ASPECTS 10.  CTA head/neck No large vessel occlusion, hemodynamically significant stenosis, or aneurysm in the head or neck. CT right leg Subcutaneous abscess with significant surrounding edema skin thickening. The air-fluid collection measures approximately 2.4 cm. MRI brain No acute intracranial abnormality. 2. Progressed but chronic small infarcts in the left cerebellar hemisphere.Stable mild but age-advanced white matter changes suggestive of chronic small vessel disease  Significant Labs: T. Chol 154, Trig 73, HDL 67, LEL 73 A1c 7.2% HIV non-reactive  Significant Imaging Studies:   Antibiotic Therapy: Anti-infectives (From admission, onward)    Start     Dose/Rate Route Frequency Ordered Stop   02/04/24 2200  cefTRIAXone (ROCEPHIN) 2 g in sodium chloride  0.9 % 100 mL IVPB        2 g 200 mL/hr over 30 Minutes Intravenous Every 24 hours 02/04/24 0334     02/04/24 0600  vancomycin (VANCOREADY) IVPB 1250 mg/250 mL        1,250 mg 166.7 mL/hr over 90 Minutes Intravenous Every 12 hours 02/04/24 0352     02/04/24 0030  vancomycin (VANCOCIN) IVPB 1000 mg/200 mL premix        1,000 mg 200 mL/hr over 60 Minutes  Intravenous  Once 02/04/24 0017 02/04/24 0150   02/03/24 2245  cefTRIAXone (ROCEPHIN) 2 g in sodium chloride  0.9 % 100 mL IVPB        2 g 200 mL/hr over 30 Minutes Intravenous  Once 02/03/24 2230 02/03/24 2333       Procedures: 02-06-2024 I&D right thigh abscess  Consultants: Neurology General surgery

## 2024-02-05 NOTE — Progress Notes (Addendum)
 [facility] Progress Note     Subjective: CC:  Reports fevers abd thigh pain with small amt drainage.  Fiance at bedside. Says he took plavix  Saturday, per [facility] it was given Sunday evening   Objective: Vital signs in last 24 hours: Temp:  [98.8 F (37.1 C)-101.8 F (38.8 C)] 101.8 F (38.8 C) (10/13 0751) Pulse Rate:  [78-102] 102 (10/13 0751) Resp:  [16-19] 18 (10/13 0751) BP: (124-169)/(58-87) 140/83 (10/13 0751) SpO2:  [94 %-96 %] 94 % (10/13 0751) Weight:  [130 kg] 130 kg (10/12 2350)    Intake/Output from previous day: No intake/output data recorded. Intake/Output this shift: No intake/output data recorded.  PE: Gen:  Alert, NAD, pleasant, fluent speech Card:  Regular rate and rhythm Pulm:  Normal effort Abd: Soft, non-tender, non-distended Skin: warm and dry, right medial thigh with 2 cm area of fluctuance and ~8 cm surrounding erythema, this area abuts the groin/scrotum. No active drainage during my exam. Area is warm and tender. Psych: A&Ox3   Lab Results:  Recent Labs    02/03/24 2155 02/03/24 2222 02/04/24 0407  WBC 17.4*  --  16.5*  HGB 13.0 13.3 12.3*  HCT 40.1 39.0 38.0*  PLT 309  --  278   BMET Recent Labs    02/03/24 2155 02/03/24 2222 02/04/24 0407  NA 137 136 135  K 3.7 3.5 3.6  CL 97* 100 100  CO2 29  --  26  GLUCOSE 259* 278* 321*  BUN 12 12 12  CREATININE 1.05 0.90 0.80  CALCIUM 10.0  --  9.3   PT/INR Recent Labs    02/03/24 2155  LABPROT 14.5  INR 1.1   CMP     Component Value Date/Time   NA 135 02/04/2024 0407   K 3.6 02/04/2024 0407   CL 100 02/04/2024 0407   CO2 26 02/04/2024 0407   GLUCOSE 321 (H) 02/04/2024 0407   BUN 12 02/04/2024 0407   CREATININE 0.80 02/04/2024 0407   CREATININE 0.78 06/26/2023 0859   CALCIUM 9.3 02/04/2024 0407   PROT 6.8 02/04/2024 0407   ALBUMIN 3.7 02/04/2024 0407   AST 21 02/04/2024 0407   ALT 15 02/04/2024 0407   ALKPHOS 75 02/04/2024 0407   BILITOT 0.9 02/04/2024  0407   GFRNONAA >60 02/04/2024 0407   GFRAA >60 10/10/2018 0330   Lipase     Component Value Date/Time   LIPASE 31 05/23/2015 2010       Studies/Results: ECHOCARDIOGRAM COMPLETE BUBBLE STUDY Result Date: 02/04/2024    ECHOCARDIOGRAM REPORT   Patient Name:   Christopher Roberson Date of Exam: 02/04/2024 Medical Rec #:  [id number]      Height:       68.0 in Accession #:    [id number]     Weight:       284.0 lb Date of Birth:  01/31/1976      BSA:          2.372 m Patient Age:    48 years       BP:           142/87 mmHg Patient Gender: M              HR:           81  bpm. Exam Location:  Inpatient Procedure: 2D Echo, Cardiac Doppler, Color Doppler and Saline Contrast Bubble            Study (Both Spectral and Color Flow Doppler were utilized during  procedure). Indications:    Stroke  History:        Patient has prior history of Echocardiogram examinations, most                 recent 04/25/2022. Stroke; Risk Factors:Sleep Apnea, Hypertension,                 Diabetes, Obesity and Non-Smoker.  Sonographer:    Logan Shove RDCS Referring Phys: [id number] VASUNDHRA RATHORE IMPRESSIONS  1. Left ventricular ejection fraction, by estimation, is 65 to 70%. The left ventricle has normal function. The left ventricle has no regional wall motion abnormalities. There is moderate concentric left ventricular hypertrophy. Left ventricular diastolic parameters are consistent with Grade II diastolic dysfunction (pseudonormalization). Elevated left atrial pressure.  2. Right ventricular systolic function is normal. The right ventricular size is normal. Tricuspid regurgitation signal is inadequate for assessing PA pressure.  3. Left atrial size was moderately dilated.  4. The mitral valve is normal in structure. No evidence of mitral valve regurgitation. No evidence of mitral stenosis.  5. The aortic valve is tricuspid. Aortic valve regurgitation is not visualized. No aortic stenosis is present.  6. The inferior vena cava is  normal in size with greater than 50% respiratory variability, suggesting right atrial pressure of 3 mmHg.  7. Agitated saline contrast bubble study was negative, with no evidence of any interatrial shunt. Comparison(s): No significant change from prior study. Prior images reviewed side by side. FINDINGS  Left Ventricle: Left ventricular ejection fraction, by estimation, is 65 to 70%. The left ventricle has normal function. The left ventricle has no regional wall motion abnormalities. The left ventricular internal cavity size was normal in size. There is  moderate concentric left ventricular hypertrophy. Left ventricular diastolic parameters are consistent with Grade II diastolic dysfunction (pseudonormalization). Elevated left atrial pressure. Right Ventricle: The right ventricular size is normal. No increase in right ventricular wall thickness. Right ventricular systolic function is normal. Tricuspid regurgitation signal is inadequate for assessing PA pressure. Left Atrium: Left atrial size was moderately dilated. Right Atrium: Right atrial size was normal in size. Pericardium: There is no evidence of pericardial effusion. Mitral Valve: The mitral valve is normal in structure. No evidence of mitral valve regurgitation. No evidence of mitral valve stenosis. Tricuspid Valve: The tricuspid valve is normal in structure. Tricuspid valve regurgitation is trivial. Aortic Valve: The aortic valve is tricuspid. Aortic valve regurgitation is not visualized. No aortic stenosis is present. Aortic valve peak gradient measures 7.4 mmHg. Pulmonic Valve: The pulmonic valve was normal in structure. Pulmonic valve regurgitation is not visualized. No evidence of pulmonic stenosis. Aorta: The aortic root and ascending aorta are structurally normal, with no evidence of dilitation. Venous: The inferior vena cava is normal in size with greater than 50% respiratory variability, suggesting right atrial pressure of 3 mmHg. IAS/Shunts: No  atrial level shunt detected by color flow Doppler. Agitated saline contrast was given intravenously to evaluate for intracardiac shunting. Agitated saline contrast bubble study was negative, with no evidence of any interatrial shunt.  LEFT VENTRICLE PLAX 2D LVIDd:         5.10 cm      Diastology LVIDs:         3.40 cm      LV e' medial:    4.90 cm/s LV PW:         1.40 cm      LV E/e' medial:  16.2 LV IVS:  1.40 cm      LV e' lateral:   5.11 cm/s LVOT diam:     2.20 cm      LV E/e' lateral: 15.6 LVOT Area:     3.80 cm  LV Volumes (MOD) LV vol d, MOD A2C: 180.0 ml LV vol d, MOD A4C: 198.0 ml LV vol s, MOD A2C: 69.6 ml LV vol s, MOD A4C: 64.1 ml LV SV MOD A2C:     110.4 ml LV SV MOD A4C:     198.0 ml LV SV MOD BP:      121.4 ml RIGHT VENTRICLE             IVC RV Basal diam:  3.90 cm     IVC diam: 1.70 cm RV S prime:     15.60 cm/s TAPSE (M-mode): 1.8 cm LEFT ATRIUM              Index        RIGHT ATRIUM           Index LA diam:        4.40 cm  1.85 cm/m   RA Area:     18.80 cm LA Vol (A2C):   125.0 ml 52.69 ml/m  RA Volume:   55.60 ml  23.44 ml/m LA Vol (A4C):   59.1 ml  24.91 ml/m LA Biplane Vol: 89.1 ml  37.56 ml/m  AORTIC VALVE AV Area (Vmax): 3.66 cm AV Vmax:        136.00 cm/s AV Peak Grad:   7.4 mmHg LVOT Vmax:      131.00 cm/s  AORTA Ao Root diam: 3.30 cm Ao Asc diam:  3.80 cm MITRAL VALVE MV Area (PHT): 3.99 cm    SHUNTS MV Decel Time: 190 msec    Systemic Diam: 2.20 cm MV E velocity: 79.60 cm/s MV A velocity: 82.60 cm/s MV E/A ratio:  0.96 Mihai Croitoru MD Electronically signed by Jerel Balding MD Signature Date/Time: 02/04/2024/2:40:32 PM    Final    MR BRAIN WO CONTRAST Result Date: 02/04/2024 EXAM: MR Brain without Intravenous Contrast. CLINICAL HISTORY: 48 year old male. Code Stroke presentation yesterday. Neuro deficit, acute, stroke suspected. TECHNIQUE: Magnetic resonance images of the brain without intravenous contrast in multiple planes. CONTRAST: Without. COMPARISON: CT head and  CTA head and neck 02/03/2024. Brain MRI 05/07/2022. FINDINGS: BRAIN: No restricted diffusion to indicate acute infarction. No intracranial mass or hemorrhage. No midline shift or extra-axial fluid collection. No cerebellar tonsillar ectopia. The central arterial and venous flow voids are patent. Major vascular flow voids appear stable from last year and negative. Linear and small chronic infarcts in the left cerebellar hemisphere are new or increased from last year but chronic (series 8 image 9). Background brain volume remains normal. Scattered small but age advanced white matter T2 and FLAIR hyperintensity in both hemispheres, especially the left corona radiata this is stable, nonspecific, but suggestive of chronic small vessel disease. No cortical encephalomalacia identified. Deep gray nuclei and brainstem remain normal. VENTRICLES: No hydrocephalus. ORBITS: The orbits are normal. SINUSES AND MASTOIDS: The sinuses and mastoid air cells are clear. BONES: No acute fracture or focal osseous lesion. IMPRESSION: 1. No acute intracranial abnormality. 2. Progressed but chronic small infarcts in the left cerebellar hemisphere. Stable mild but age-advanced white matter changes suggestive of chronic small vessel disease. Electronically signed by: Helayne Hurst MD 02/04/2024 10:26 AM EDT RP Workstation: [id number]   CT ANGIO HEAD NECK W WO CM Result Date: 02/03/2024 EXAM:  CTA HEAD AND NECK WITHOUT AND WITH IV CONTRAST 02/03/2024 10:51:54 PM TECHNIQUE: CTA of the head and neck was performed without and with the administration of 75mL intravenous contrast (iohexol  (OMNIPAQUE ) 350 MG/ML injection 75 mL IOHEXOL  350 MG/ML SOLN). Multiplanar 2D and/or 3D reformatted images are provided for review. Automated exposure control, iterative reconstruction, and/or weight based adjustment of the mA/kV was utilized to reduce the radiation dose to as low as reasonably achievable. Stenosis of the internal carotid arteries measured using  NASCET criteria. COMPARISON: None available CLINICAL HISTORY: Stroke suspected (Ped 0-17y); right hemiparesis. FINDINGS: AORTIC ARCH AND ARCH VESSELS: No dissection or arterial injury. No significant stenosis of the brachiocephalic or subclavian arteries. CERVICAL CAROTID ARTERIES: No dissection, arterial injury, or hemodynamically significant stenosis by NASCET criteria. CERVICAL VERTEBRAL ARTERIES: No dissection, arterial injury, or significant stenosis. LUNGS AND MEDIASTINUM: Unremarkable. SOFT TISSUES: No acute abnormality. BONES: No acute abnormality. ANTERIOR CIRCULATION: No significant stenosis of the internal carotid arteries. No significant stenosis of the anterior cerebral arteries. No significant stenosis of the middle cerebral arteries. No aneurysm. POSTERIOR CIRCULATION: No significant stenosis of the posterior cerebral arteries. No significant stenosis of the basilar artery. No significant stenosis of the vertebral arteries. No aneurysm. OTHER: No dural venous sinus thrombosis on this non-dedicated study. IMPRESSION: 1. No large vessel occlusion, hemodynamically significant stenosis, or aneurysm in the head or neck. Electronically signed by: Gilmore Molt MD 02/03/2024 11:14 PM EDT RP Workstation: [id number]   CT EXTREMITY LOWER RIGHT W CONTRAST Result Date: 02/03/2024 CLINICAL DATA:  Soft tissue swelling in the right thigh EXAM: CT OF THE LOWER RIGHT EXTREMITY WITH CONTRAST TECHNIQUE: Multidetector CT imaging of the lower right extremity was performed according to the standard protocol following intravenous contrast administration. RADIATION DOSE REDUCTION: This exam was performed according to the departmental dose-optimization program which includes automated exposure control, adjustment of the mA and/or kV according to patient size and/or use of iterative reconstruction technique. CONTRAST:  75mL OMNIPAQUE  IOHEXOL  350 MG/ML SOLN COMPARISON:  None Available. FINDINGS: Bones/Joint/Cartilage  Right femur is well visualized and within normal limits. No acute fracture is noted. Ligaments Suboptimally assessed by CT. Muscles and Tendons Musculature of the right thigh appears within normal limits. No focal hematoma is noted. Soft tissues Considerable subcutaneous edema is identified within the anteromedial aspect of the right thigh. Overlying skin thickening is noted consistent with cellulitis. Foci of air are noted superiorly as well as a small air-fluid collection which measures approximately 2.4 cm in greatest dimension consistent with a small abscess. Reactive adenopathy in the right inguinal region is seen. No other soft tissue abnormality is noted. IMPRESSION: Subcutaneous abscess with significant surrounding edema skin thickening. The air-fluid collection measures approximately 2.4 cm. Electronically Signed   By: Oneil Devonshire M.D.   On: 02/03/2024 23:12   CT HEAD CODE STROKE WO CONTRAST (LKW 0-4.5h, LVO 0-24h) Result Date: 02/03/2024 EXAM: CT HEAD WITHOUT CONTRAST 02/03/2024 09:59:12 PM TECHNIQUE: CT of the head was performed without the administration of intravenous contrast. Automated exposure control, iterative reconstruction, and/or weight based adjustment of the mA/kV was utilized to reduce the radiation dose to as low as reasonably achievable. COMPARISON: None available. CLINICAL HISTORY: Neuro deficit, acute, stroke suspected. FINDINGS: BRAIN AND VENTRICLES: No acute hemorrhage. No evidence of acute infarct. No hydrocephalus. No extra-axial collection. No mass effect or midline shift. ORBITS: No acute abnormality. SINUSES: No acute abnormality. SOFT TISSUES AND SKULL: No acute soft tissue abnormality. No skull fracture. IMPRESSION: 1. No acute intracranial abnormality.  ASPECTS  10. Findings discussed with Dr. Zammit via telephone at 10:17 PM Electronically signed by: Gilmore Molt MD 02/03/2024 10:19 PM EDT RP Workstation: [id number]    Anti-infectives: Anti-infectives (From  admission, onward)    Start     Dose/Rate Route Frequency Ordered Stop   02/04/24 2200  cefTRIAXone (ROCEPHIN) 2 g in sodium chloride  0.9 % 100 mL IVPB        2 g 200 mL/hr over 30 Minutes Intravenous Every 24 hours 02/04/24 0334     02/04/24 0600  vancomycin (VANCOREADY) IVPB 1250 mg/250 mL        1,250 mg 166.7 mL/hr over 90 Minutes Intravenous Every 12 hours 02/04/24 0352     02/04/24 0030  vancomycin (VANCOCIN) IVPB 1000 mg/200 mL premix        1,000 mg 200 mL/hr over 60 Minutes Intravenous  Once 02/04/24 0017 02/04/24 0150   02/03/24 2245  cefTRIAXone (ROCEPHIN) 2 g in sodium chloride  0.9 % 100 mL IVPB        2 g 200 mL/hr over 30 Minutes Intravenous  Once 02/03/24 2230 02/03/24 2333        Assessment/Plan Right medial thigh abscess Febrile to 101, HR 80-low 100's Needs I&D in OR vs at bedside. Per [facility] the patient has no evidence of acute stroke and is acceptable risk for anesthesia. Last dose plavix  last night (sunday).  Will discuss with Dr. Vernetta and make final plans for procedure at bedside vs OR.   FEN: NPO, I will re-order diet if we delay procedure due to recent antiplatelet tx ID:  VTE: SCD's  DM2 OSA HLD HTN Obesity PMH multiple CVA's with residual right sided weakness.     LOS: 1 day   I reviewed nursing notes, hospitalist notes, last 24 h vitals and pain scores, last 48 h intake and output, last 24 h labs and trends, and last 24 h imaging results.  This care required moderate level of medical decision making.   Almarie Pringle, PA-C [facility] Please see Amion for pager number during day hours 7:00am-4:30pm

## 2024-02-05 NOTE — TOC Initial Note (Signed)
 Transition of Care [facility]) - Initial/Assessment Note    Patient Details  Name: Christopher Roberson MRN: [id number] Date of Birth: Jan 03, 1976  Transition of Care [facility]) CM/SW Contact:    Christopher JULIANNA George, RN Phone Number: 02/05/2024, 10:27 AM  Clinical Narrative:                   Christopher Roberson is a 48 y.o. 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   Pt is from home with his brother. Brother works during the daytime.  Pt drives and manages his own medications.  No follow up per PT.   Plan: Rt thigh I&D.  IP Care management following.  Expected Discharge Plan: Home/Self Care Barriers to Discharge: Continued Medical Work up   Patient Goals and CMS Choice            Expected Discharge Plan and Services   Discharge Planning Services: CM Consult   Living arrangements for the past 2 months: Apartment                                      Prior Living Arrangements/Services Living arrangements for the past 2 months: Apartment Lives with:: Siblings Patient language and need for interpreter reviewed:: Yes Do you feel safe going back to the place where you live?: Yes          Current home services: DME (CPAP) Criminal Activity/Legal Involvement Pertinent to Current Situation/Hospitalization: No - Comment as needed  Activities of Daily Living      Permission Sought/Granted                  Emotional Assessment Appearance:: Appears stated age Attitude/Demeanor/Rapport: Engaged Affect (typically observed): Accepting Orientation: : Oriented to Self, Oriented to Place, Oriented to  Time, Oriented to Situation   Psych Involvement: No (comment)  Admission diagnosis:  Right sided weakness [R53.1] Stroke-like symptoms [R29.90] Cellulitis of right thigh  [L03.115] Patient Active Problem List   Diagnosis Date Noted   Right sided weakness 02/04/2024   Sepsis (HCC) 02/04/2024   Cellulitis 02/04/2024   Type 2 diabetes mellitus (HCC) 02/04/2024   Stroke (HCC) 05/06/2022   Weakness of right upper extremity 05/22/2021   HLD (hyperlipidemia) 04/29/2021   Acute CVA (cerebrovascular accident) (HCC) 04/29/2021   Compression of right ulnar nerve at multiple levels 07/30/2020   Paresthesias 05/22/2020   Carpal tunnel syndrome of right wrist 02/26/2020   Hypomagnesemia 03/04/2019   Ulnar neuropathy of right upper extremity 03/04/2019   Ulnar nerve compression, left 02/11/2019   Status post placement of implantable loop recorder 12/07/2018   Degenerative cervical disc 11/04/2018   COVID-19 virus infection    Suspected COVID-19 virus infection 10/09/2018   Viral gastroenteritis 10/09/2018   Acute respiratory failure with hypoxia (HCC) 10/08/2018   Medication management 08/27/2018   Cerebellar cerebrovascular accident (CVA) without late effect 05/09/2018   Bell's palsy 05/09/2018   Current use of insulin  (HCC) 03/19/2018   Dry eye syndrome of both lacrimal glands 03/19/2018   Vitreous floaters of both eyes 03/19/2018   Premature ejaculation, acquired, generalized, moderate 02/26/2018   Gross hematuria 02/19/2018   Moderate episode of recurrent major depressive disorder (HCC) 02/06/2018   Grief 02/05/2018   SI (sacroiliac) pain 11/30/2017   GAD (generalized  anxiety disorder) 07/20/2017   Severe nonproliferative diabetic retinopathy of both eyes without macular edema associated with type 2 diabetes mellitus (HCC) 07/20/2017   Thoracic radiculopathy 02/21/2017   Chronic right-sided low back pain with right-sided sciatica 02/16/2017   Alcohol abuse 11/01/2016   Intentional drug overdose (HCC) 10/31/2016   Severe episode of recurrent major depressive disorder, without psychotic features (HCC) 10/31/2016   Chronic pain of right knee 03/24/2016    Palpitations 08/17/2015   Vitamin D deficiency 06/24/2015   Adult situational stress disorder 06/10/2015   Recurrent depression 06/10/2015   Umbilical hernia 06/10/2015   Acute pancreatitis 06/02/2015   Personality disorder (HCC) 04/03/2015   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 [place] [address] Phone: [phone] Fax: [phone]  [facility] [address] [place] [address] Phone: [phone] Fax: [phone]     Social Drivers of Health (SDOH) Social History: SDOH Screenings   Food Insecurity: Low Risk  (07/14/2023)   Received from [facility]  Housing: Low Risk  (07/14/2023)   Received from [facility]  Transportation Needs: No Transportation Needs (07/14/2023)   Received from [facility]  Utilities: Low Risk  (07/14/2023)   Received from [facility]  Depression (PHQ2-9): Low Risk  (05/31/2021)  Financial Resource Strain: Low Risk  (05/20/2021)  Recent Concern: Financial Resource Strain - Medium Risk (03/31/2021)   Received from [facility] visits prior to 06/25/2022.  Physical Activity: Insufficiently Active (03/31/2021)   Received from [facility] visits prior to 06/25/2022.  Social Connections: Unknown (09/06/2021)   Received from [facility]  Stress: No Stress Concern Present (05/31/2021)  Recent Concern: Stress - Stress Concern Present (03/31/2021)   Received from [facility]  visits prior to 06/25/2022.  Tobacco Use: Low Risk  (01/16/2024)   Received from [facility]   SDOH Interventions:     Readmission Risk Interventions     No data to display

## 2024-02-05 NOTE — Assessment & Plan Note (Addendum)
 02/05/24 on SSI.  02/06/24 CBG >200. Add lantus  10 units daily.

## 2024-02-06 ENCOUNTER — Inpatient Hospital Stay (HOSPITAL_COMMUNITY): Admitting: Certified Registered Nurse Anesthetist

## 2024-02-06 ENCOUNTER — Encounter (HOSPITAL_COMMUNITY)
Admission: EM | Disposition: A | Discharge status: Home Health | Payer: Self-pay | Source: Home / Self Care | Attending: Internal Medicine

## 2024-02-06 ENCOUNTER — Encounter (HOSPITAL_COMMUNITY): Payer: Self-pay | Admitting: Internal Medicine

## 2024-02-06 DIAGNOSIS — I10 Essential (primary) hypertension: Secondary | ICD-10-CM | POA: Diagnosis not present

## 2024-02-06 DIAGNOSIS — L02415 Cutaneous abscess of right lower limb: Secondary | ICD-10-CM | POA: Diagnosis not present

## 2024-02-06 DIAGNOSIS — E876 Hypokalemia: Secondary | ICD-10-CM | POA: Insufficient documentation

## 2024-02-06 DIAGNOSIS — I69351 Hemiplegia and hemiparesis following cerebral infarction affecting right dominant side: Secondary | ICD-10-CM | POA: Diagnosis not present

## 2024-02-06 DIAGNOSIS — F418 Other specified anxiety disorders: Secondary | ICD-10-CM | POA: Diagnosis not present

## 2024-02-06 DIAGNOSIS — E1165 Type 2 diabetes mellitus with hyperglycemia: Secondary | ICD-10-CM

## 2024-02-06 DIAGNOSIS — Z794 Long term (current) use of insulin: Secondary | ICD-10-CM

## 2024-02-06 DIAGNOSIS — L02214 Cutaneous abscess of groin: Secondary | ICD-10-CM

## 2024-02-06 DIAGNOSIS — L0390 Cellulitis, unspecified: Secondary | ICD-10-CM | POA: Diagnosis not present

## 2024-02-06 HISTORY — PX: IRRIGATION AND DEBRIDEMENT ABSCESS: SHX5252

## 2024-02-06 LAB — CBC WITH DIFFERENTIAL/PLATELET
Abs Immature Granulocytes: 0.17 K/uL — ABNORMAL HIGH (ref 0.00–0.07)
Basophils Absolute: 0.1 K/uL (ref 0.0–0.1)
Basophils Relative: 0 %
Eosinophils Absolute: 0.1 K/uL (ref 0.0–0.5)
Eosinophils Relative: 0 %
HCT: 33.7 % — ABNORMAL LOW (ref 39.0–52.0)
Hemoglobin: 11.5 g/dL — ABNORMAL LOW (ref 13.0–17.0)
Immature Granulocytes: 1 %
Lymphocytes Relative: 7 %
Lymphs Abs: 1.4 K/uL (ref 0.7–4.0)
MCH: 29.7 pg (ref 26.0–34.0)
MCHC: 34.1 g/dL (ref 30.0–36.0)
MCV: 87.1 fL (ref 80.0–100.0)
Monocytes Absolute: 1.2 K/uL — ABNORMAL HIGH (ref 0.1–1.0)
Monocytes Relative: 6 %
Neutro Abs: 16.3 K/uL — ABNORMAL HIGH (ref 1.7–7.7)
Neutrophils Relative %: 86 %
Platelets: 336 K/uL (ref 150–400)
RBC: 3.87 MIL/uL — ABNORMAL LOW (ref 4.22–5.81)
RDW: 11.9 % (ref 11.5–15.5)
WBC: 19.2 K/uL — ABNORMAL HIGH (ref 4.0–10.5)
nRBC: 0 % (ref 0.0–0.2)

## 2024-02-06 LAB — GLUCOSE, CAPILLARY
Glucose-Capillary: 245 mg/dL — ABNORMAL HIGH (ref 70–99)
Glucose-Capillary: 298 mg/dL — ABNORMAL HIGH (ref 70–99)
Glucose-Capillary: 292 mg/dL — ABNORMAL HIGH (ref 70–99)
Glucose-Capillary: 261 mg/dL — ABNORMAL HIGH (ref 70–99)
Glucose-Capillary: 248 mg/dL — ABNORMAL HIGH (ref 70–99)
Glucose-Capillary: 258 mg/dL — ABNORMAL HIGH (ref 70–99)
Glucose-Capillary: 213 mg/dL — ABNORMAL HIGH (ref 70–99)
Glucose-Capillary: 359 mg/dL — ABNORMAL HIGH (ref 70–99)

## 2024-02-06 LAB — COMPREHENSIVE METABOLIC PANEL WITH GFR
ALT: 16 U/L (ref 0–44)
AST: 12 U/L — ABNORMAL LOW (ref 15–41)
Albumin: 2.5 g/dL — ABNORMAL LOW (ref 3.5–5.0)
Alkaline Phosphatase: 74 U/L (ref 38–126)
Anion gap: 12 (ref 5–15)
BUN: 13 mg/dL (ref 6–20)
CO2: 23 mmol/L (ref 22–32)
Calcium: 8.2 mg/dL — ABNORMAL LOW (ref 8.9–10.3)
Chloride: 97 mmol/L — ABNORMAL LOW (ref 98–111)
Creatinine, Ser: 1.58 mg/dL — ABNORMAL HIGH (ref 0.61–1.24)
GFR, Estimated: 53 mL/min — ABNORMAL LOW (ref >60)
Glucose, Bld: 274 mg/dL — ABNORMAL HIGH (ref 70–99)
Potassium: 3.2 mmol/L — ABNORMAL LOW (ref 3.5–5.1)
Sodium: 132 mmol/L — ABNORMAL LOW (ref 135–145)
Total Bilirubin: 1.1 mg/dL (ref 0.0–1.2)
Total Protein: 6 g/dL — ABNORMAL LOW (ref 6.5–8.1)

## 2024-02-06 SURGERY — IRRIGATION AND DEBRIDEMENT ABSCESS
Anesthesia: General | Site: Groin | Laterality: Right

## 2024-02-06 MED ORDER — HYDROMORPHONE HCL 1 MG/ML IJ SOLN
1.0000 mg | Freq: Once | INTRAMUSCULAR | Status: AC
  Administered 2024-02-06: 1 mg via INTRAVENOUS
  Filled 2024-02-06: qty 1

## 2024-02-06 MED ORDER — INSULIN ASPART 100 UNIT/ML IJ SOLN
0.0000 [IU] | INTRAMUSCULAR | Status: DC
  Administered 2024-02-06: 5 [IU] via SUBCUTANEOUS
  Administered 2024-02-06 (×2): 8 [IU] via SUBCUTANEOUS
  Administered 2024-02-06: 15 [IU] via SUBCUTANEOUS
  Administered 2024-02-07 (×2): 3 [IU] via SUBCUTANEOUS
  Administered 2024-02-07: 11 [IU] via SUBCUTANEOUS
  Administered 2024-02-07: 5 [IU] via SUBCUTANEOUS
  Administered 2024-02-07: 8 [IU] via SUBCUTANEOUS
  Administered 2024-02-08: 2 [IU] via SUBCUTANEOUS
  Administered 2024-02-08: 3 [IU] via SUBCUTANEOUS
  Administered 2024-02-08 (×3): 2 [IU] via SUBCUTANEOUS
  Administered 2024-02-09: 15 [IU] via SUBCUTANEOUS
  Administered 2024-02-09: 11 [IU] via SUBCUTANEOUS
  Administered 2024-02-10 (×2): 8 [IU] via SUBCUTANEOUS
  Administered 2024-02-10: 5 [IU] via SUBCUTANEOUS
  Administered 2024-02-10: 2 [IU] via SUBCUTANEOUS
  Administered 2024-02-11 (×5): 3 [IU] via SUBCUTANEOUS
  Administered 2024-02-11: 8 [IU] via SUBCUTANEOUS
  Administered 2024-02-12 (×3): 2 [IU] via SUBCUTANEOUS

## 2024-02-06 MED ORDER — LINEZOLID 600 MG PO TABS
600.0000 mg | ORAL_TABLET | Freq: Two times a day (BID) | ORAL | Status: DC
  Administered 2024-02-06 – 2024-02-09 (×6): 600 mg via ORAL
  Filled 2024-02-06 (×7): qty 1

## 2024-02-06 MED ORDER — HEPARIN SODIUM (PORCINE) 5000 UNIT/ML IJ SOLN
5000.0000 [IU] | Freq: Three times a day (TID) | INTRAMUSCULAR | Status: DC
  Administered 2024-02-06 – 2024-02-12 (×16): 5000 [IU] via SUBCUTANEOUS
  Filled 2024-02-06 (×16): qty 1

## 2024-02-06 MED ORDER — ONDANSETRON HCL 4 MG/2ML IJ SOLN: 4.0000 mg | Freq: Once | INTRAMUSCULAR | Status: DC | PRN

## 2024-02-06 MED ORDER — TRAMADOL HCL 50 MG PO TABS
100.0000 mg | ORAL_TABLET | Freq: Four times a day (QID) | ORAL | Status: DC | PRN
  Administered 2024-02-06 – 2024-02-09 (×6): 100 mg via ORAL
  Filled 2024-02-06 (×6): qty 2

## 2024-02-06 MED ORDER — PHENYLEPHRINE 80 MCG/ML (10ML) SYRINGE FOR IV PUSH (FOR BLOOD PRESSURE SUPPORT)
PREFILLED_SYRINGE | INTRAVENOUS | Status: DC | PRN
  Administered 2024-02-06: 80 ug via INTRAVENOUS

## 2024-02-06 MED ORDER — MIDAZOLAM HCL 2 MG/2ML IJ SOLN
INTRAMUSCULAR | Status: AC
  Filled 2024-02-06: qty 2

## 2024-02-06 MED ORDER — HYDROMORPHONE HCL 1 MG/ML IJ SOLN
1.0000 mg | INTRAMUSCULAR | Status: DC | PRN
  Administered 2024-02-06 – 2024-02-09 (×11): 1 mg via INTRAVENOUS
  Administered 2024-02-10 (×2): 2 mg via INTRAVENOUS
  Filled 2024-02-06 (×6): qty 1
  Filled 2024-02-06: qty 2
  Filled 2024-02-06 (×5): qty 1
  Filled 2024-02-06: qty 2

## 2024-02-06 MED ORDER — LACTATED RINGERS IV SOLN: INTRAVENOUS | Status: DC

## 2024-02-06 MED ORDER — POTASSIUM CHLORIDE CRYS ER 20 MEQ PO TBCR
40.0000 meq | EXTENDED_RELEASE_TABLET | ORAL | Status: AC
  Administered 2024-02-06 (×2): 40 meq via ORAL
  Filled 2024-02-06 (×2): qty 2

## 2024-02-06 MED ORDER — SODIUM CHLORIDE 0.9 % IV SOLN: INTRAVENOUS | Status: AC

## 2024-02-06 MED ORDER — ACETAMINOPHEN 500 MG PO TABS
1000.0000 mg | ORAL_TABLET | Freq: Four times a day (QID) | ORAL | Status: DC
  Administered 2024-02-06 – 2024-02-12 (×17): 1000 mg via ORAL
  Filled 2024-02-06 (×18): qty 2

## 2024-02-06 MED ORDER — HYDROMORPHONE HCL 2 MG PO TABS: 2.0000 mg | ORAL_TABLET | ORAL | Status: DC | PRN

## 2024-02-06 MED ORDER — INSULIN GLARGINE 100 UNIT/ML ~~LOC~~ SOLN
10.0000 [IU] | Freq: Every day | SUBCUTANEOUS | Status: DC
  Administered 2024-02-06 – 2024-02-07 (×2): 10 [IU] via SUBCUTANEOUS
  Filled 2024-02-06 (×2): qty 0.1

## 2024-02-06 MED ORDER — ACETAMINOPHEN 10 MG/ML IV SOLN
INTRAVENOUS | Status: DC | PRN
  Administered 2024-02-06: 1000 mg via INTRAVENOUS

## 2024-02-06 MED ORDER — LIDOCAINE 2% (20 MG/ML) 5 ML SYRINGE
INTRAMUSCULAR | Status: AC
  Filled 2024-02-06: qty 5

## 2024-02-06 MED ORDER — ROCURONIUM BROMIDE 10 MG/ML (PF) SYRINGE
PREFILLED_SYRINGE | INTRAVENOUS | Status: AC
  Filled 2024-02-06: qty 10

## 2024-02-06 MED ORDER — BUPIVACAINE-EPINEPHRINE (PF) 0.5% -1:200000 IJ SOLN
INTRAMUSCULAR | Status: DC | PRN
  Administered 2024-02-06: 20 mL via PERINEURAL

## 2024-02-06 MED ORDER — MEPERIDINE HCL 25 MG/ML IJ SOLN: 6.2500 mg | INTRAMUSCULAR | Status: DC | PRN

## 2024-02-06 MED ORDER — HYDROMORPHONE HCL 2 MG PO TABS: 4.0000 mg | ORAL_TABLET | ORAL | Status: DC | PRN

## 2024-02-06 MED ORDER — OXYCODONE HCL 5 MG PO TABS: 5.0000 mg | ORAL_TABLET | Freq: Once | ORAL | Status: DC | PRN

## 2024-02-06 MED ORDER — ONDANSETRON HCL 4 MG/2ML IJ SOLN
INTRAMUSCULAR | Status: AC
  Filled 2024-02-06: qty 2

## 2024-02-06 MED ORDER — ONDANSETRON HCL 4 MG/2ML IJ SOLN
INTRAMUSCULAR | Status: DC | PRN
  Administered 2024-02-06: 4 mg via INTRAVENOUS

## 2024-02-06 MED ORDER — BUPIVACAINE-EPINEPHRINE (PF) 0.5% -1:200000 IJ SOLN
INTRAMUSCULAR | Status: AC
  Filled 2024-02-06: qty 30

## 2024-02-06 MED ORDER — 0.9 % SODIUM CHLORIDE (POUR BTL) OPTIME
TOPICAL | Status: DC | PRN
  Administered 2024-02-06: 1000 mL

## 2024-02-06 MED ORDER — HYDROMORPHONE HCL 1 MG/ML IJ SOLN: 1.0000 mg | INTRAMUSCULAR | Status: DC | PRN

## 2024-02-06 MED ORDER — PROPOFOL 10 MG/ML IV BOLUS
INTRAVENOUS | Status: DC | PRN
  Administered 2024-02-06: 150 mg via INTRAVENOUS
  Administered 2024-02-06: 50 mg via INTRAVENOUS

## 2024-02-06 MED ORDER — MIDAZOLAM HCL 2 MG/2ML IJ SOLN
INTRAMUSCULAR | Status: DC | PRN
  Administered 2024-02-06: 2 mg via INTRAVENOUS

## 2024-02-06 MED ORDER — POTASSIUM CHLORIDE 10 MEQ/100ML IV SOLN
10.0000 meq | INTRAVENOUS | Status: AC
  Administered 2024-02-06 (×3): 10 meq via INTRAVENOUS
  Filled 2024-02-06 (×3): qty 100

## 2024-02-06 MED ORDER — FENTANYL CITRATE (PF) 250 MCG/5ML IJ SOLN
INTRAMUSCULAR | Status: AC
  Filled 2024-02-06: qty 5

## 2024-02-06 MED ORDER — CHLORHEXIDINE GLUCONATE 0.12 % MT SOLN: 15.0000 mL | Freq: Once | OROMUCOSAL | Status: AC

## 2024-02-06 MED ORDER — CHLORHEXIDINE GLUCONATE 0.12 % MT SOLN
OROMUCOSAL | Status: AC
  Administered 2024-02-06: 15 mL via OROMUCOSAL
  Filled 2024-02-06: qty 15

## 2024-02-06 MED ORDER — LIDOCAINE 2% (20 MG/ML) 5 ML SYRINGE
INTRAMUSCULAR | Status: DC | PRN
Start: 2024-02-06 — End: 2024-02-06
  Administered 2024-02-06: 100 mg via INTRAVENOUS

## 2024-02-06 MED ORDER — ORAL CARE MOUTH RINSE: 15.0000 mL | Freq: Once | OROMUCOSAL | Status: AC

## 2024-02-06 MED ORDER — FENTANYL CITRATE (PF) 100 MCG/2ML IJ SOLN: 25.0000 ug | INTRAMUSCULAR | Status: DC | PRN

## 2024-02-06 MED ORDER — PROPOFOL 10 MG/ML IV BOLUS
INTRAVENOUS | Status: AC
  Filled 2024-02-06: qty 20

## 2024-02-06 MED ORDER — OXYCODONE HCL 5 MG/5ML PO SOLN: 5.0000 mg | Freq: Once | ORAL | Status: DC | PRN

## 2024-02-06 MED ORDER — FENTANYL CITRATE (PF) 250 MCG/5ML IJ SOLN
INTRAMUSCULAR | Status: DC | PRN
  Administered 2024-02-06 (×2): 50 ug via INTRAVENOUS

## 2024-02-06 SURGICAL SUPPLY — 24 items
BAG COUNTER SPONGE SURGICOUNT (BAG) ×1 IMPLANT
BNDG GAUZE DERMACEA FLUFF 4 (GAUZE/BANDAGES/DRESSINGS) IMPLANT
CANISTER SUCTION 3000ML PPV (SUCTIONS) ×1 IMPLANT
COVER SURGICAL LIGHT HANDLE (MISCELLANEOUS) ×1 IMPLANT
DRAPE LAPAROSCOPIC ABDOMINAL (DRAPES) IMPLANT
DRAPE LAPAROTOMY 100X72 PEDS (DRAPES) IMPLANT
ELECTRODE REM PT RTRN 9FT ADLT (ELECTROSURGICAL) ×1 IMPLANT
GAUZE PAD ABD 8X10 STRL (GAUZE/BANDAGES/DRESSINGS) IMPLANT
GAUZE SPONGE 4X4 12PLY STRL (GAUZE/BANDAGES/DRESSINGS) IMPLANT
GLOVE BIOGEL PI IND STRL 8 (GLOVE) ×1 IMPLANT
GLOVE ECLIPSE 8.0 STRL XLNG CF (GLOVE) ×1 IMPLANT
GOWN STRL REUS W/ TWL LRG LVL3 (GOWN DISPOSABLE) ×2 IMPLANT
KIT BASIN OR (CUSTOM PROCEDURE TRAY) ×1 IMPLANT
KIT TURNOVER KIT B (KITS) ×1 IMPLANT
PACK GENERAL/GYN (CUSTOM PROCEDURE TRAY) ×1 IMPLANT
PAD ABD 8X10 STRL (GAUZE/BANDAGES/DRESSINGS) IMPLANT
PAD ARMBOARD POSITIONER FOAM (MISCELLANEOUS) ×1 IMPLANT
PENCIL SMOKE EVACUATOR (MISCELLANEOUS) ×1 IMPLANT
SOLN 0.9% NACL 1000 ML (IV SOLUTION) ×1 IMPLANT
SOLN 0.9% NACL POUR BTL 1000ML (IV SOLUTION) ×1 IMPLANT
SWAB COLLECTION DEVICE MRSA (MISCELLANEOUS) IMPLANT
SWAB CULTURE ESWAB REG 1ML (MISCELLANEOUS) IMPLANT
TOWEL GREEN STERILE (TOWEL DISPOSABLE) ×1 IMPLANT
TOWEL GREEN STERILE FF (TOWEL DISPOSABLE) ×1 IMPLANT

## 2024-02-06 NOTE — Inpatient Diabetes Management (Signed)
 Inpatient Diabetes Program Recommendations  AACE/ADA: New Consensus Statement on Inpatient Glycemic Control  Target Ranges:  Prepandial:   less than 140 mg/dL      Peak postprandial:   less than 180 mg/dL (1-2 hours)      Critically ill patients:  140 - 180 mg/dL    Latest Reference Range & Units 02/05/24 11:57 02/05/24 15:25 02/05/24 20:34 02/05/24 23:46 02/06/24 04:58  Glucose-Capillary 70 - 99 mg/dL 762 (H) 749 (H) 735 (H) 264 (H) 245 (H)   Review of Glycemic Control  Diabetes history: DM2 Outpatient Diabetes medications: Farxiga 10 mg daily, Metformin  1000 mg BID, Actos  30 mg daily, Trulicity  0.75 mg Qweek Current orders for Inpatient glycemic control: Novolog  0-9 units Q4H   Inpatient Diabetes Program Recommendations:     Insulin : Please consider ordering Lantus  15 units Q24H starting now.   Thanks, Earnie Gainer, RN, MSN, CDCES Diabetes Coordinator Inpatient Diabetes Program [phone] (Team Pager from 8am to 5pm)

## 2024-02-06 NOTE — Plan of Care (Signed)
 Problem: Education: Goal: Ability to describe self-care measures that may prevent or decrease complications (Diabetes Survival Skills Education) will improve 02/06/2024 0512 by Wallace Izetta PARAS, RN Outcome: Progressing 02/05/2024 2257 by Wallace Izetta PARAS, RN Outcome: Progressing Goal: Individualized Educational Video(s) Outcome: Progressing   Problem: Coping: Goal: Ability to adjust to condition or change in health will improve 02/06/2024 0512 by Wallace Izetta PARAS, RN Outcome: Progressing 02/05/2024 2257 by Wallace Izetta PARAS, RN Outcome: Progressing   Problem: Fluid Volume: Goal: Ability to maintain a balanced intake and output will improve Outcome: Progressing   Problem: Health Behavior/Discharge Planning: Goal: Ability to identify and utilize available resources and services will improve Outcome: Progressing Goal: Ability to manage health-related needs will improve 02/06/2024 0512 by Wallace Izetta PARAS, RN Outcome: Progressing 02/05/2024 2257 by Wallace Izetta PARAS, RN Outcome: Progressing   Problem: Metabolic: Goal: Ability to maintain appropriate glucose levels will improve 02/06/2024 0512 by Wallace Izetta PARAS, RN Outcome: Progressing 02/05/2024 2257 by Wallace Izetta PARAS, RN Outcome: Progressing   Problem: Nutritional: Goal: Maintenance of adequate nutrition will improve Outcome: Progressing Goal: Progress toward achieving an optimal weight will improve Outcome: Progressing   Problem: Skin Integrity: Goal: Risk for impaired skin integrity will decrease Outcome: Progressing   Problem: Tissue Perfusion: Goal: Adequacy of tissue perfusion will improve Outcome: Progressing   Problem: Education: Goal: Knowledge of disease or condition will improve 02/06/2024 0512 by Wallace Izetta PARAS, RN Outcome: Progressing 02/05/2024 2257 by Wallace Izetta PARAS, RN Outcome: Progressing Goal: Knowledge of secondary prevention will improve (MUST DOCUMENT ALL) Outcome: Progressing Goal: Knowledge  of patient specific risk factors will improve (DELETE if not current risk factor) 02/06/2024 0512 by Wallace Izetta PARAS, RN Outcome: Progressing 02/05/2024 2257 by Wallace Izetta PARAS, RN Outcome: Progressing   Problem: Ischemic Stroke/TIA Tissue Perfusion: Goal: Complications of ischemic stroke/TIA will be minimized Outcome: Progressing   Problem: Coping: Goal: Will verbalize positive feelings about self Outcome: Progressing Goal: Will identify appropriate support needs Outcome: Progressing   Problem: Health Behavior/Discharge Planning: Goal: Ability to manage health-related needs will improve Outcome: Progressing Goal: Goals will be collaboratively established with patient/family Outcome: Progressing   Problem: Self-Care: Goal: Ability to participate in self-care as condition permits will improve Outcome: Progressing Goal: Verbalization of feelings and concerns over difficulty with self-care will improve Outcome: Progressing Goal: Ability to communicate needs accurately will improve 02/06/2024 0512 by Wallace, Lexx [place], RN Outcome: Progressing 02/05/2024 2257 by Wallace Izetta PARAS, RN Outcome: Progressing   Problem: Nutrition: Goal: Risk of aspiration will decrease Outcome: Progressing Goal: Dietary intake will improve Outcome: Progressing   Problem: Education: Goal: Knowledge of General Education information will improve Description: Including pain rating scale, medication(s)/side effects and non-pharmacologic comfort measures Outcome: Progressing   Problem: Health Behavior/Discharge Planning: Goal: Ability to manage health-related needs will improve Outcome: Progressing   Problem: Clinical Measurements: Goal: Ability to maintain clinical measurements within normal limits will improve Outcome: Progressing Goal: Will remain free from infection Outcome: Progressing Goal: Diagnostic test results will improve Outcome: Progressing Goal: Respiratory complications will  improve Outcome: Progressing Goal: Cardiovascular complication will be avoided Outcome: Progressing   Problem: Activity: Goal: Risk for activity intolerance will decrease Outcome: Progressing   Problem: Nutrition: Goal: Adequate nutrition will be maintained Outcome: Progressing   Problem: Coping: Goal: Level of anxiety will decrease Outcome: Progressing   Problem: Elimination: Goal: Will not experience complications related to bowel motility Outcome: Progressing Goal: Will not experience complications related to urinary retention Outcome: Progressing   Problem: Pain  Managment: Goal: General experience of comfort will improve and/or be controlled Outcome: Progressing   Problem: Safety: Goal: Ability to remain free from injury will improve Outcome: Progressing   Problem: Skin Integrity: Goal: Risk for impaired skin integrity will decrease Outcome: Progressing   Problem: Clinical Measurements: Goal: Ability to avoid or minimize complications of infection will improve Outcome: Progressing   Problem: Skin Integrity: Goal: Skin integrity will improve Outcome: Progressing

## 2024-02-06 NOTE — Progress Notes (Addendum)
 PROGRESS NOTE    Christopher Roberson  FMW:[id number] DOB: 02-20-1976 DOA: 02/03/2024 PCP: [facility] [address]  Subjective: Pt seen and examined. Taken to OR today for I&D of right thigh abscess. Still having fevers. On IV Rocephin   Hospital Course: CC: lightheaded, facial twitching, right side numbness HPI: Christopher Roberson is a 48 y.o. 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  LKW 1930.  CT head showing no acute intracranial abnormality.  CTA head and neck negative for LVO. Seen by teleneurology and they are suspecting stroke recrudescence phenomenon versus recurrent ischemic stroke.  Recommend admission for stroke workup.  Patient presented to the ED with fever of 101.1 F and heart rate of 106.  Blood pressure initially elevated to 201/97.  Patient noted to have cellulitis of his right upper leg/inner thigh.  Labs notable for WBC count 17.4, glucose 259, T. bili 1.5, transaminases and alkaline phosphatase normal, ethanol level <15, COVID/influenza/RSV PCR negative, UDS negative, UA not suggestive of infection.  CT of right lower extremity showing findings consistent with cellulitis and a small subcutaneous abscess measuring 2.4 cm. Patient was given Tylenol , vancomycin, and ceftriaxone.  TRH called to admit.   Patient reports history of 2 previous strokes which left him with mild right-sided weakness.  Tonight around 7 PM he had worsening right upper and lower extremity weakness and numbness.  Symptoms have now improved.  He is also complaining of right upper thigh redness, swelling, and pain which has gotten progressively worse over the past 3 days and now having fevers.  He has not noticed any pus drainage from this area.  Patient states just before he came into the emergency room, his urine  was dark orange in color.  Denies any pain with urination.  Denies abdominal pain, nausea, or vomiting.  Denies cough, shortness of breath, or chest pain.  Significant Events: Admitted 02/03/2024 for right sided weakness/numbness 02-04-2024 general surgery consulted for right thigh abscess. MRI brain negative for new CVA 02-06-2024 pt taken to OR for I&D of right thigh abscess  Admission Labs: WBC 17.4, HgB 13, plt 309 INR 1.1 Na 137, K 3.7, CO2 of 29, BUN 12, Scr 1.05 T. Prot 8.2, AST 19, ALT 15, alk phos 78, T. Bili 1.5 ETOH <15 Covid/rsv/flu negative UDS negative UA spg <1.005, glu >500, small HgB, negative nitrite, negative LE  Admission Imaging Studies: CT head No acute intracranial abnormality. ASPECTS 10.  CTA head/neck No large vessel occlusion, hemodynamically significant stenosis, or aneurysm in the head or neck. CT right leg Subcutaneous abscess with significant surrounding edema skin thickening. The air-fluid collection measures approximately 2.4 cm. MRI brain No acute intracranial abnormality. 2. Progressed but chronic small infarcts in the left cerebellar hemisphere.Stable mild but age-advanced white matter changes suggestive of chronic small vessel disease  Significant Labs: T. Chol 154, Trig 73, HDL 67, LEL 73 A1c 7.2% HIV non-reactive  Significant Imaging Studies:   Antibiotic Therapy: Anti-infectives (From admission, onward)    Start     Dose/Rate Route Frequency Ordered Stop   02/04/24 2200  cefTRIAXone (ROCEPHIN) 2 g in sodium chloride  0.9 % 100 mL IVPB        2 g 200 mL/hr over 30 Minutes Intravenous Every 24 hours 02/04/24 0334     02/04/24 0600  vancomycin (VANCOREADY) IVPB 1250 mg/250 mL  1,250 mg 166.7 mL/hr over 90 Minutes Intravenous Every 12 hours 02/04/24 0352     02/04/24 0030  vancomycin (VANCOCIN) IVPB 1000 mg/200 mL premix        1,000 mg 200 mL/hr over 60 Minutes Intravenous  Once 02/04/24 0017 02/04/24 0150   02/03/24 2245   cefTRIAXone (ROCEPHIN) 2 g in sodium chloride  0.9 % 100 mL IVPB        2 g 200 mL/hr over 30 Minutes Intravenous  Once 02/03/24 2230 02/03/24 2333       Procedures: 02-06-2024 I&D right thigh abscess  Consultants: Neurology General surgery    Assessment and Plan: * Right sided weakness-resolved as of 02/06/2024 02/05/24 no new stroke on MRI brain. He has chronic right sided weakness due to old prior CVA.  02/06/24 chronic. Resolved. No new CVA.    Abscess of right thigh 02/05/24 on IV rocephin/vanco. Surgery consulted. May be able to take to OR for I&D. Still febrile.  02/06/24 s/p I&D of right thigh abscess. Prn oxycodone, IV dilaudid for pain. On IV rocephin. Awaiting abscess culture data. Surgery following    Sepsis due to cellulitis (HCC) 02/05/24 present on admission. Cellulitis and abscess right thigh. WBC 17.4, fever of 101.4, abscess noted on CT right leg.   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  02/06/24 chronoic.  Obesity, Class III, BMI 40-49.9 (morbid obesity) (HCC) 02/05/24 Body mass index is 43.59 kg/m.    Essential hypertension 02/05/24 continue losartan  and nifedipine.  02/06/24 stable on losartan  and nifedipine.   Type 2 diabetes mellitus with hyperglycemia, with long-term current use of insulin  (HCC) 02/05/24 on SSI.  02/06/24 CBG >200. Add lantus  10 units daily.    Hypokalemia 02/06/24 replete with oral kcl. Repeat BMP in AM.    DVT prophylaxis: heparin injection 5,000 Units Start: 02/06/24 2200 SCD's Start: 02/04/24 0319    Code Status: Full Code Family Communication: discussed by phone with wife [name] Disposition Plan: home Reason for continuing need for hospitalization: remains on IV ABX. Awaiting discharge clearance by general surgery.  Objective: Vitals:   02/06/24 1445 02/06/24 1500 02/06/24 1515 02/06/24 1533  BP: 108/61 118/61  119/71  Pulse: 95 93 86 79   Resp: (!) 32 20 16 18   Temp: 99.8 F (37.7 C)  99 F (37.2 C) (!) 100.5 F (38.1 C)  TempSrc:    Oral  SpO2: 92% 98% 94% 98%  Weight:      Height:        Intake/Output Summary (Last 24 hours) at 02/06/2024 1623 Last data filed at 02/06/2024 1433 Gross per 24 hour  Intake 1299.73 ml  Output 5 ml  Net 1294.73 ml   Filed Weights   02/04/24 2350 02/06/24 1311  Weight: 130 kg 130 kg    Examination:  Physical Exam Vitals and nursing note reviewed.  Constitutional:      Appearance: He is obese.  HENT:     Head: Normocephalic and atraumatic.  Pulmonary:     Effort: Pulmonary effort is normal. No respiratory distress.  Abdominal:     General: Bowel sounds are normal.     Palpations: Abdomen is soft.  Skin:    General: Skin is warm.     Capillary Refill: Capillary refill takes less than 2 seconds.  Neurological:     Mental Status: He is alert and oriented to person, place, and time.     Data Reviewed: I have personally reviewed following labs and  imaging studies  CBC: Recent Labs  Lab 02/03/24 2155 02/03/24 2222 02/04/24 0407 02/06/24 0122  WBC 17.4*  --  16.5* 19.2*  NEUTROABS 14.5*  --   --  16.3*  HGB 13.0 13.3 12.3* 11.5*  HCT 40.1 39.0 38.0* 33.7*  MCV 89.7  --  89.4 87.1  PLT 309  --  278 336   Basic Metabolic Panel: Recent Labs  Lab 02/03/24 2155 02/03/24 2222 02/04/24 0407 02/06/24 0122  NA 137 136 135 132*  K 3.7 3.5 3.6 3.2*  CL 97* 100 100 97*  CO2 29  --  26 23  GLUCOSE 259* 278* 321* 274*  BUN 12 12 12 13   CREATININE 1.05 0.90 0.80 1.58*  CALCIUM  10.0  --  9.3 8.2*   GFR: Estimated Creatinine Clearance: 73.4 mL/min (A) (by C-G formula based on SCr of 1.58 mg/dL (H)). Liver Function Tests: Recent Labs  Lab 02/03/24 2155 02/04/24 0407 02/06/24 0122  AST 19 21 12*  ALT 15 15 16   ALKPHOS 78 75 74  BILITOT 1.5* 0.9 1.1  PROT 8.2* 6.8 6.0*  ALBUMIN 4.3 3.7 2.5*   Coagulation Profile: Recent Labs  Lab 02/03/24 2155  INR 1.1    HbA1C: Recent Labs    02/04/24 0407  HGBA1C 7.2*   CBG: Recent Labs  Lab 02/06/24 0829 02/06/24 1154 02/06/24 1404 02/06/24 1444 02/06/24 1544  GLUCAP 298* 292* 261* 248* 258*   Lipid Profile: Recent Labs    02/04/24 0407  CHOL 154  HDL 67  LDLCALC 73  TRIG 73  CHOLHDL 2.3   Sepsis Labs: Recent Labs  Lab 02/04/24 0407  LATICACIDVEN 1.0    Recent Results (from the past 240 hours)  Resp panel by RT-PCR (RSV, Flu A&B, Covid) Anterior Nasal Swab     Status: None   Collection Time: 02/03/24 10:19 PM   Specimen: Anterior Nasal Swab  Result Value Ref Range Status   SARS Coronavirus 2 by RT PCR NEGATIVE NEGATIVE Final    Comment: (NOTE) SARS-CoV-2 target nucleic acids are NOT DETECTED.  The SARS-CoV-2 RNA is generally detectable in upper respiratory specimens during the acute phase of infection. The lowest concentration of SARS-CoV-2 viral copies this assay can detect is 138 copies/mL. A negative result does not preclude SARS-Cov-2 infection and should not be used as the sole basis for treatment or other patient management decisions. A negative result may occur with  improper specimen collection/handling, submission of specimen other than nasopharyngeal swab, presence of viral mutation(s) within the areas targeted by this assay, and inadequate number of viral copies(<138 copies/mL). A negative result must be combined with clinical observations, patient history, and epidemiological information. The expected result is Negative.  Fact Sheet for Patients:  BloggerCourse.com  Fact Sheet for Healthcare Providers:  SeriousBroker.it  This test is no t yet approved or cleared by the [place]  FDA and  has been authorized for detection and/or diagnosis of SARS-CoV-2 by FDA under an Emergency Use Authorization (EUA). This EUA will remain  in effect (meaning this test can be used) for the duration of the COVID-19  declaration under Section 564(b)(1) of the Act, 21 U.S.C.section 360bbb-3(b)(1), unless the authorization is terminated  or revoked sooner.       Influenza A by PCR NEGATIVE NEGATIVE Final   Influenza B by PCR NEGATIVE NEGATIVE Final    Comment: (NOTE) The Xpert Xpress SARS-CoV-2/FLU/RSV plus assay is intended as an aid in the diagnosis of influenza from Nasopharyngeal swab specimens and should not  be used as a sole basis for treatment. Nasal washings and aspirates are unacceptable for Xpert Xpress SARS-CoV-2/FLU/RSV testing.  Fact Sheet for Patients: BloggerCourse.com  Fact Sheet for Healthcare Providers: SeriousBroker.it  This test is not yet approved or cleared by the [place]  FDA and has been authorized for detection and/or diagnosis of SARS-CoV-2 by FDA under an Emergency Use Authorization (EUA). This EUA will remain in effect (meaning this test can be used) for the duration of the COVID-19 declaration under Section 564(b)(1) of the Act, 21 U.S.C. section 360bbb-3(b)(1), unless the authorization is terminated or revoked.     Resp Syncytial Virus by PCR NEGATIVE NEGATIVE Final    Comment: (NOTE) Fact Sheet for Patients: BloggerCourse.com  Fact Sheet for Healthcare Providers: SeriousBroker.it  This test is not yet approved or cleared by the [place]  FDA and has been authorized for detection and/or diagnosis of SARS-CoV-2 by FDA under an Emergency Use Authorization (EUA). This EUA will remain in effect (meaning this test can be used) for the duration of the COVID-19 declaration under Section 564(b)(1) of the Act, 21 U.S.C. section 360bbb-3(b)(1), unless the authorization is terminated or revoked.  Performed at [facility], [address], [place], [place] [address]   Urine Culture     Status: None   Collection Time: 02/04/24 12:25 AM    Specimen: Urine, Clean Catch  Result Value Ref Range Status   Specimen Description   Final    URINE, CLEAN CATCH Performed at [facility], [address] [place], [place] [address]    Special Requests   Final    NONE Performed at [facility], [address], [place], [place] [address]    Culture   Final    NO GROWTH Performed at [facility], [address], [place], [place] [address]    Report Status 02/05/2024 FINAL  Final  Culture, blood (Routine X 2) w Reflex to ID Panel     Status: None (Preliminary result)   Collection Time: 02/04/24  9:28 PM   Specimen: BLOOD LEFT HAND  Result Value Ref Range Status   Specimen Description BLOOD LEFT HAND  Final   Special Requests   Final    BOTTLES DRAWN AEROBIC AND ANAEROBIC Blood Culture adequate volume   Culture   Final    NO GROWTH 2 DAYS Performed at [facility], [address] [place], [place] [address]    Report Status PENDING  Incomplete  Culture, blood (Routine X 2) w Reflex to ID Panel     Status: None (Preliminary result)   Collection Time: 02/04/24  9:28 PM   Specimen: BLOOD RIGHT HAND  Result Value Ref Range Status   Specimen Description BLOOD RIGHT HAND  Final   Special Requests   Final    BOTTLES DRAWN AEROBIC AND ANAEROBIC Blood Culture adequate volume   Culture   Final    NO GROWTH 2 DAYS Performed at [facility], [address] [place], [place] [address]    Report Status PENDING  Incomplete  MRSA Next Gen by PCR, Nasal     Status: None   Collection Time: 02/05/24  6:14 PM   Specimen: Nasal Mucosa; Nasal Swab  Result Value Ref Range Status   MRSA by PCR Next Gen NOT DETECTED NOT DETECTED Final    Comment: (NOTE) The GeneXpert MRSA Assay (FDA approved for NASAL specimens only), is one component of a comprehensive MRSA colonization surveillance program. It  is not intended to diagnose MRSA infection nor to guide or monitor treatment for MRSA  infections. Test performance is not FDA approved in patients less than 59 years old. Performed at [facility], [address] [place], [place] [address]     Scheduled Meds:  acetaminophen   1,000 mg Oral QID   atorvastatin   80 mg Oral QPM   ezetimibe  10 mg Oral QPM   heparin injection (subcutaneous)  5,000 Units Subcutaneous Q8H   hydrALAZINE   50 mg Oral BID   insulin  aspart  0-15 Units Subcutaneous Q4H   insulin  glargine  10 Units Subcutaneous Daily   linezolid  600 mg Oral Q12H   NIFEdipine  90 mg Oral Daily   pantoprazole   40 mg Oral Daily   potassium chloride   40 mEq Oral Q4H   sertraline   100 mg Oral Daily   traZODone   50 mg Oral QHS   Continuous Infusions:  sodium chloride  100 mL/hr at 02/06/24 1546   cefTRIAXone (ROCEPHIN)  IV 2 g (02/05/24 2054)     LOS: 2 days   Time spent: 55 minutes  Camellia Door, DO  [facility]  02/06/2024, 4:23 PM

## 2024-02-06 NOTE — Anesthesia Preprocedure Evaluation (Addendum)
 Anesthesia Evaluation  Patient identified by MRN, date of birth, ID band Patient awake    Reviewed: Allergy & Precautions, H&P , NPO status , Patient's Chart, lab work & pertinent test results  Airway Mallampati: II  TM Distance: >3 FB Neck ROM: Full    Dental no notable dental hx. (+) Teeth Intact, Dental Advisory Given   Pulmonary neg pulmonary ROS, sleep apnea and Continuous Positive Airway Pressure Ventilation    Pulmonary exam normal breath sounds clear to auscultation       Cardiovascular Exercise Tolerance: Good hypertension, Pt. on medications negative cardio ROS Normal cardiovascular exam Rhythm:Regular Rate:Normal  Echo 10/12   1. Left ventricular ejection fraction, by estimation, is 65 to 70%. The  left ventricle has normal function. The left ventricle has no regional  wall motion abnormalities. There is moderate concentric left ventricular  hypertrophy. Left ventricular  diastolic parameters are consistent with Grade II diastolic dysfunction  (pseudonormalization). Elevated left atrial pressure.   2. Right ventricular systolic function is normal. The right ventricular  size is normal. Tricuspid regurgitation signal is inadequate for assessing  PA pressure.   3. Left atrial size was moderately dilated.   4. The mitral valve is normal in structure. No evidence of mitral valve  regurgitation. No evidence of mitral stenosis.   5. The aortic valve is tricuspid. Aortic valve regurgitation is not  visualized. No aortic stenosis is present.   6. The inferior vena cava is normal in size with greater than 50%  respiratory variability, suggesting right atrial pressure of 3 mmHg.   7. Agitated saline contrast bubble study was negative, with no evidence  of any interatrial shunt.     Neuro/Psych  PSYCHIATRIC DISORDERS Anxiety Depression     Neuromuscular disease CVA negative neurological ROS  negative psych ROS    GI/Hepatic negative GI ROS, Neg liver ROS,,,  Endo/Other  negative endocrine ROSdiabetes    Renal/GU negative Renal ROS  negative genitourinary   Musculoskeletal negative musculoskeletal ROS (+)    Abdominal   Peds negative pediatric ROS (+)  Hematology negative hematology ROS (+)   Anesthesia Other Findings   Reproductive/Obstetrics negative OB ROS                              Anesthesia Physical Anesthesia Plan  ASA: 3  Anesthesia Plan: General   Post-op Pain Management: Ofirmev  IV (intra-op)*   Induction: Intravenous  PONV Risk Score and Plan: 2 and Ondansetron  and Dexamethasone  Airway Management Planned: LMA  Additional Equipment: None  Intra-op Plan:   Post-operative Plan: Extubation in OR  Informed Consent: I have reviewed the patients History and Physical, chart, labs and discussed the procedure including the risks, benefits and alternatives for the proposed anesthesia with the patient or authorized representative who has indicated his/her understanding and acceptance.       Plan Discussed with: Anesthesiologist and CRNA  Anesthesia Plan Comments: (  )         Anesthesia Quick Evaluation

## 2024-02-06 NOTE — Plan of Care (Signed)
   Problem: Skin Integrity: Goal: Risk for impaired skin integrity will decrease Outcome: Progressing

## 2024-02-06 NOTE — Anesthesia Postprocedure Evaluation (Signed)
 Anesthesia Post Note  Patient: Christopher Roberson  Procedure(s) Performed: IRRIGATION AND DEBRIDEMENT ABSCESS (Right: Groin)     Patient location during evaluation: PACU Anesthesia Type: General Level of consciousness: awake and alert Pain management: pain level controlled Vital Signs Assessment: post-procedure vital signs reviewed and stable Respiratory status: spontaneous breathing, nonlabored ventilation, respiratory function stable and patient connected to nasal cannula oxygen Cardiovascular status: blood pressure returned to baseline and stable Postop Assessment: no apparent nausea or vomiting Anesthetic complications: no   No notable events documented.  Last Vitals:  Vitals:   02/06/24 1311 02/06/24 1445  BP: (!) 116/47 108/61  Pulse: 89 95  Resp: 20 (!) 32  Temp: 37.2 C 37.7 C  SpO2: 93% 92%    Last Pain:  Vitals:   02/06/24 1445  TempSrc:   PainSc: Asleep                 Alix Lahmann

## 2024-02-06 NOTE — Assessment & Plan Note (Signed)
 02/06/24 replete with oral kcl. Repeat BMP in AM.

## 2024-02-06 NOTE — Op Note (Signed)
   Christopher Roberson 02/03/2024 - 02/06/2024   Pre-op Diagnosis: RIGHT THIGH ABSCESS     Post-op Diagnosis: same  Procedure(s): INCISION AND DRAINAGE RIGHT THIGH ABSCESS  Surgeon(s): Vernetta Berg, MD  Anesthesia: General  Staff:  Circulator: Welford Stevenson NOVAK, RN Scrub Person: Storm Maryelizabeth CROME, RN  Estimated Blood Loss: Minimal               Specimens: cultures sent  Procedure: The patient was brought operating identified the correct patient.  He was placed upon on the operating table and general anesthesia was induced.  I slightly frog-legged the right leg.  His right inner thigh the area of the abscess was then prepped and draped in usual sterile fashion.  I anesthetized the skin with Marcaine  and then performed a longitudinal incision over the most fluctuant area with a scalpel.  I entered a moderate-sized abscess cavity going down deep into the thigh.  I extended the incision inferior the and superiorly with cautery.  Cultures were obtained of the purulence.  All the purulence was suctioned out of the wound.  I then copiously irrigated it with a liter of normal saline.  Hemostasis was achieved with the cautery.  I injected further Marcaine  into the wound.  There is minimal necrotic tissue in the wound.  I then packed the wound with a 3 inch Kerlix using about a third of the Curlex.  This was then covered with dry gauze and ABDs.  Tape was then applied.  The patient tolerated the procedure well.  All the counts were correct at the end of the procedure.  The patient was then extubated in the operating room and taken in stable condition to the recovery room.          [name]   Date: 02/06/2024  Time: 2:33 PM

## 2024-02-06 NOTE — Progress Notes (Signed)
 Patient ID: Christopher Roberson, male   DOB: [birth year], 48 y.o.   MRN: [id number]   Pre Procedure note for inpatients:   Christopher Roberson has been scheduled for Procedure(s) with comments: IRRIGATION AND DEBRIDEMENT ABSCESS (Right) - RIGHT THIGH ABSCESS today. The various methods of treatment have been discussed with the patient. After consideration of the risks, benefits and treatment options the patient has consented to the planned procedure.   The patient has been seen and labs reviewed. There are no changes in the patient's condition to prevent proceeding with the planned procedure today.  Recent labs:  Lab Results  Component Value Date   WBC 19.2 (H) 02/06/2024   HGB 11.5 (L) 02/06/2024   HCT 33.7 (L) 02/06/2024   PLT 336 02/06/2024   GLUCOSE 274 (H) 02/06/2024   CHOL 154 02/04/2024   TRIG 73 02/04/2024   HDL 67 02/04/2024   LDLCALC 73 02/04/2024   ALT 16 02/06/2024   AST 12 (L) 02/06/2024   NA 132 (L) 02/06/2024   K 3.2 (L) 02/06/2024   CL 97 (L) 02/06/2024   CREATININE 1.58 (H) 02/06/2024   BUN 13 02/06/2024   CO2 23 02/06/2024   INR 1.1 02/03/2024   HGBA1C 7.2 (H) 02/04/2024   MICROALBUR 15.0 06/26/2023    Vicenta Poli, MD 02/06/2024 10:42 AM

## 2024-02-06 NOTE — Progress Notes (Signed)
 Patient's latest temp 101.6 orally and CBG 245. Patient refused Tylenol  and SSI of 3 units, stating, I've been taking Tylenol  and insulin  since I've been here and they have not been working, my temperature and sugar have not come down. Educated patient on the effects of an infection on higher blood glucose. Patient still refused medications.

## 2024-02-06 NOTE — Transfer of Care (Signed)
 Immediate Anesthesia Transfer of Care Note  Patient: Christopher Roberson  Procedure(s) Performed: IRRIGATION AND DEBRIDEMENT ABSCESS (Right)  Patient Location: PACU  Anesthesia Type:General  Level of Consciousness: awake, sedated, patient cooperative, and responds to stimulation  Airway & Oxygen Therapy: Patient Spontanous Breathing and Patient connected to face mask oxygen  Post-op Assessment: Report given to RN, Post -op Vital signs reviewed and stable, Patient moving all extremities X 4, and Patient able to stick tongue midline  Post vital signs: Reviewed and stable  Last Vitals:  Vitals Value Taken Time  BP 114/60 02/06/24 14:43  Temp 99.8   Pulse 180 02/06/24 14:44  Resp 23 02/06/24 14:44  SpO2 93 % 02/06/24 14:44  Vitals shown include unfiled device data.  Last Pain:  Vitals:   02/06/24 1326  TempSrc:   PainSc: 8          Complications: No notable events documented.

## 2024-02-07 ENCOUNTER — Encounter (HOSPITAL_COMMUNITY): Payer: Self-pay | Admitting: Surgery

## 2024-02-07 DIAGNOSIS — I10 Essential (primary) hypertension: Secondary | ICD-10-CM | POA: Diagnosis not present

## 2024-02-07 DIAGNOSIS — E876 Hypokalemia: Secondary | ICD-10-CM

## 2024-02-07 DIAGNOSIS — L03115 Cellulitis of right lower limb: Secondary | ICD-10-CM

## 2024-02-07 DIAGNOSIS — R2990 Unspecified symptoms and signs involving the nervous system: Secondary | ICD-10-CM

## 2024-02-07 DIAGNOSIS — L0390 Cellulitis, unspecified: Secondary | ICD-10-CM | POA: Diagnosis not present

## 2024-02-07 DIAGNOSIS — R531 Weakness: Secondary | ICD-10-CM | POA: Diagnosis not present

## 2024-02-07 LAB — CBC WITH DIFFERENTIAL/PLATELET
Abs Immature Granulocytes: 0.24 K/uL — ABNORMAL HIGH (ref 0.00–0.07)
Basophils Absolute: 0.1 K/uL (ref 0.0–0.1)
Basophils Relative: 0 %
Eosinophils Absolute: 0 K/uL (ref 0.0–0.5)
Eosinophils Relative: 0 %
HCT: 33.1 % — ABNORMAL LOW (ref 39.0–52.0)
Hemoglobin: 11 g/dL — ABNORMAL LOW (ref 13.0–17.0)
Immature Granulocytes: 1 %
Lymphocytes Relative: 6 %
Lymphs Abs: 1.3 K/uL (ref 0.7–4.0)
MCH: 29.7 pg (ref 26.0–34.0)
MCHC: 33.2 g/dL (ref 30.0–36.0)
MCV: 89.5 fL (ref 80.0–100.0)
Monocytes Absolute: 2 K/uL — ABNORMAL HIGH (ref 0.1–1.0)
Monocytes Relative: 9 %
Neutro Abs: 18.1 K/uL — ABNORMAL HIGH (ref 1.7–7.7)
Neutrophils Relative %: 84 %
Platelets: 366 K/uL (ref 150–400)
RBC: 3.7 MIL/uL — ABNORMAL LOW (ref 4.22–5.81)
RDW: 11.9 % (ref 11.5–15.5)
WBC: 21.7 K/uL — ABNORMAL HIGH (ref 4.0–10.5)
nRBC: 0 % (ref 0.0–0.2)

## 2024-02-07 LAB — COMPREHENSIVE METABOLIC PANEL WITH GFR
ALT: 15 U/L (ref 0–44)
AST: 14 U/L — ABNORMAL LOW (ref 15–41)
Albumin: 2.2 g/dL — ABNORMAL LOW (ref 3.5–5.0)
Alkaline Phosphatase: 71 U/L (ref 38–126)
Anion gap: 11 (ref 5–15)
BUN: 24 mg/dL — ABNORMAL HIGH (ref 6–20)
CO2: 21 mmol/L — ABNORMAL LOW (ref 22–32)
Calcium: 7.8 mg/dL — ABNORMAL LOW (ref 8.9–10.3)
Chloride: 96 mmol/L — ABNORMAL LOW (ref 98–111)
Creatinine, Ser: 1.36 mg/dL — ABNORMAL HIGH (ref 0.61–1.24)
GFR, Estimated: >60 mL/min (ref >60)
Glucose, Bld: 366 mg/dL — ABNORMAL HIGH (ref 70–99)
Potassium: 4.1 mmol/L (ref 3.5–5.1)
Sodium: 128 mmol/L — ABNORMAL LOW (ref 135–145)
Total Bilirubin: 0.8 mg/dL (ref 0.0–1.2)
Total Protein: 6 g/dL — ABNORMAL LOW (ref 6.5–8.1)

## 2024-02-07 LAB — GLUCOSE, CAPILLARY
Glucose-Capillary: 333 mg/dL — ABNORMAL HIGH (ref 70–99)
Glucose-Capillary: 282 mg/dL — ABNORMAL HIGH (ref 70–99)
Glucose-Capillary: 229 mg/dL — ABNORMAL HIGH (ref 70–99)
Glucose-Capillary: 186 mg/dL — ABNORMAL HIGH (ref 70–99)
Glucose-Capillary: 163 mg/dL — ABNORMAL HIGH (ref 70–99)
Glucose-Capillary: 145 mg/dL — ABNORMAL HIGH (ref 70–99)

## 2024-02-07 MED ORDER — INSULIN GLARGINE-YFGN 100 UNIT/ML ~~LOC~~ SOLN
20.0000 [IU] | Freq: Every day | SUBCUTANEOUS | Status: DC
  Administered 2024-02-08 – 2024-02-12 (×5): 20 [IU] via SUBCUTANEOUS
  Filled 2024-02-07 (×5): qty 0.2

## 2024-02-07 MED ORDER — INSULIN GLARGINE 100 UNIT/ML ~~LOC~~ SOLN
20.0000 [IU] | Freq: Every day | SUBCUTANEOUS | Status: DC
  Filled 2024-02-07: qty 0.2

## 2024-02-07 NOTE — Inpatient Diabetes Management (Signed)
 Inpatient Diabetes Program Recommendations  AACE/ADA: New Consensus Statement on Inpatient Glycemic Control   Target Ranges:  Prepandial:   less than 140 mg/dL      Peak postprandial:   less than 180 mg/dL (1-2 hours)      Critically ill patients:  140 - 180 mg/dL    Latest Reference Range & Units 02/07/24 03:53 02/07/24 08:50  Glucose-Capillary 70 - 99 mg/dL 666 (H) 717 (H)    Latest Reference Range & Units 02/06/24 08:29 02/06/24 11:54 02/06/24 14:04 02/06/24 14:44 02/06/24 15:44 02/06/24 19:47 02/06/24 23:14  Glucose-Capillary 70 - 99 mg/dL 701 (H) 707 (H) 738 (H) 248 (H) 258 (H) 213 (H) 359 (H)    Latest Reference Range & Units 02/04/24 04:07  Hemoglobin A1C 4.8 - 5.6 % 7.2 (H)    Latest Reference Range & Units 06/26/23 08:59  C-Peptide 0.80 - 3.85 ng/mL 3.50   Review of Glycemic Control  Diabetes history: DM2 Outpatient Diabetes medications: Farxiga 10 mg daily, Metformin  1000 mg BID, Actos  30 mg daily, Trulicity  0.75 mg Qweek Current orders for Inpatient glycemic control: Lantus  10 units daily, Novolog  0-15 units Q4H   Inpatient Diabetes Program Recommendations:     Insulin : Please consider increasing Lantus  to 20 units daily.  Thanks, Earnie Gainer, RN, MSN, CDCES Diabetes Coordinator Inpatient Diabetes Program [phone] (Team Pager from 8am to 5pm)

## 2024-02-07 NOTE — Progress Notes (Signed)
 [facility] Progress Note  1 Day Post-Op  Subjective: CC:  Rates thigh pain as 8/10  Objective: Vital signs in last 24 hours: Temp:  [98 F (36.7 C)-100.5 F (38.1 C)] 98.5 F (36.9 C) (10/15 0720) Pulse Rate:  [79-95] 80 (10/15 0720) Resp:  [14-32] 14 (10/15 0720) BP: (102-132)/(47-80) 108/58 (10/15 0720) SpO2:  [91 %-99 %] 99 % (10/15 0720) Weight:  [130 kg] 130 kg (10/14 1311) Last BM Date : 02/04/24  Intake/Output from previous day: 10/14 0701 - 10/15 0700 In: 2409.2 [P.O.:300; I.V.:1663.5; IV Piggyback:445.6] Out: 805 [Urine:800; Blood:5] Intake/Output this shift: No intake/output data recorded.  PE: Gen:  Alert, NAD, pleasant, fluent speech Card:  Regular rate and rhythm Pulm:  Normal effort Abd: Soft, non-tender, non-distended Skin: warm and dry, right medial thigh as below: surgical wound ~7x3x5 cm. There are two deeper areas- one immediately posterior (~5 cm deep), one posterior/inferior (3 cm). There is surrounding cellulitis especially medially/posteriorly that will need to be monitored. Re-packed.   Psych: A&Ox3   Lab Results:  Recent Labs    02/06/24 0122 02/07/24 0252  WBC 19.2* 21.7*  HGB 11.5* 11.0*  HCT 33.7* 33.1*  PLT 336 366   BMET Recent Labs    02/06/24 0122 02/07/24 0252  NA 132* 128*  K 3.2* 4.1  CL 97* 96*  CO2 23 21*  GLUCOSE 274* 366*  BUN 13 24*  CREATININE 1.58* 1.36*  CALCIUM  8.2* 7.8*   PT/INR No results for input(s): LABPROT, INR in the last 72 hours.  CMP     Component Value Date/Time   NA 128 (L) 02/07/2024 0252   K 4.1 02/07/2024 0252   CL 96 (L) 02/07/2024 0252   CO2 21 (L) 02/07/2024 0252   GLUCOSE 366 (H) 02/07/2024 0252   BUN 24 (H) 02/07/2024 0252   CREATININE 1.36 (H) 02/07/2024 0252   CREATININE 0.78 06/26/2023 0859   CALCIUM  7.8 (L) 02/07/2024 0252   PROT 6.0 (L) 02/07/2024 0252   ALBUMIN 2.2 (L) 02/07/2024 0252   AST 14 (L) 02/07/2024 0252   ALT 15 02/07/2024 0252   ALKPHOS 71  02/07/2024 0252   BILITOT 0.8 02/07/2024 0252   GFRNONAA >60 02/07/2024 0252   GFRAA >60 10/10/2018 0330   Lipase     Component Value Date/Time   LIPASE 31 05/23/2015 2010       Studies/Results: No results found.   Anti-infectives: Anti-infectives (From admission, onward)    Start     Dose/Rate Route Frequency Ordered Stop   02/06/24 1800  linezolid (ZYVOX) tablet 600 mg        600 mg Oral Every 12 hours 02/06/24 0926     02/04/24 2200  cefTRIAXone (ROCEPHIN) 2 g in sodium chloride  0.9 % 100 mL IVPB        2 g 200 mL/hr over 30 Minutes Intravenous Every 24 hours 02/04/24 0334     02/04/24 0600  vancomycin (VANCOREADY) IVPB 1250 mg/250 mL  Status:  Discontinued        1,250 mg 166.7 mL/hr over 90 Minutes Intravenous Every 12 hours 02/04/24 0352 02/06/24 0926   02/04/24 0030  vancomycin (VANCOCIN) IVPB 1000 mg/200 mL premix        1,000 mg 200 mL/hr over 60 Minutes Intravenous  Once 02/04/24 0017 02/04/24 0150   02/03/24 2245  cefTRIAXone (ROCEPHIN) 2 g in sodium chloride  0.9 % 100 mL IVPB        2 g 200 mL/hr over 30 Minutes Intravenous  Once 02/03/24 2230 02/03/24 2333        Assessment/Plan Right medial thigh abscess S/p I&D 10/14 Dr. Vernetta  POD#1 - WBC 21.7 from 19.2  - afebrile, HD stable - cultures w/ GPC, GNR, follow - continue Abx - continue BID wet-to-dry dressing changes, start education on wound care for home. - likely ok to resume plavix , will confirm with MD.  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     LOS: 3 days   I reviewed nursing notes, hospitalist notes, last 24 h vitals and pain scores, last 48 h intake and output, last 24 h labs and trends, and last 24 h imaging results.  This care required moderate level of medical decision making.   Almarie Pringle, PA-C [facility] Please see Amion for pager number during day hours 7:00am-4:30pm

## 2024-02-07 NOTE — Progress Notes (Signed)
 [facility]                                                                               Dorn Hartshorne, is a 48 y.o. male, DOB - 1975/12/19, FMW:[id number] Admit date - 02/03/2024    Outpatient Primary MD for the patient is [facility] [employer]  LOS - 3  days    Brief summary   Christopher Roberson is a 48 y.o. 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  LKW 1930.  CT head showing no acute intracranial abnormality.  CTA head and neck negative for LVO. Seen by teleneurology and they are suspecting stroke recrudescence phenomenon versus recurrent ischemic stroke.  Recommend admission for stroke workup.    Assessment & Plan    Assessment and Plan: * Right sided weakness-resolved as of 02/06/2024 02/05/24 no new stroke on MRI brain. He has chronic right sided weakness due to old prior CVA.      Abscess of right thigh S/p I&D , on IV antibiotics. Currently waiting for cultures.  General surgery on board.      Sepsis due to cellulitis (HCC) 02/05/24 present on admission. Cellulitis and abscess right thigh.    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   Obesity, Class III, BMI 40-49.9 (morbid obesity) (HCC) 02/05/24 Body mass index is 43.59 kg/m.    Essential hypertension Well controlled on nifedipine.    Type 2 diabetes mellitus with hyperglycemia, with long-term current use of insulin  (HCC) CBG (last 3)  Recent Labs    02/07/24 0353 02/07/24 0850 02/07/24 1138  GLUCAP 333* 282* 229*   Resume SSI.  Increase Lantus  and add novolog  3 units TIDAC.    Hypokalemia REPLACED.          Estimated body mass index is 44.89 kg/m as calculated from the following:   Height as of  this encounter: 5' 7 (1.702 m).   Weight as of this encounter: 130 kg.  Code Status: FULL CODE.  DVT Prophylaxis:  heparin injection 5,000 Units Start: 02/06/24 2200 SCD's Start: 02/04/24 0319   Level of Care: Level of care: Progressive Family Communication: none at bedside.   Disposition Plan:     Remains inpatient appropriate:  pending culture report.   Procedures:  S/p I&D 10/14 Dr. Vernetta   Consultants:   Surgery.   Antimicrobials:   Anti-infectives (From admission, onward)    Start     Dose/Rate Route Frequency Ordered Stop   02/06/24 1800  linezolid (ZYVOX) tablet 600 mg        600 mg Oral Every 12 hours 02/06/24 0926     02/04/24 2200  cefTRIAXone (ROCEPHIN) 2 g in sodium chloride  0.9 % 100 mL IVPB        2 g 200 mL/hr over 30 Minutes Intravenous Every 24 hours 02/04/24 0334     02/04/24 0600  vancomycin (VANCOREADY) IVPB 1250 mg/250 mL  Status:  Discontinued        1,250  mg 166.7 mL/hr over 90 Minutes Intravenous Every 12 hours 02/04/24 0352 02/06/24 0926   02/04/24 0030  vancomycin (VANCOCIN) IVPB 1000 mg/200 mL premix        1,000 mg 200 mL/hr over 60 Minutes Intravenous  Once 02/04/24 0017 02/04/24 0150   02/03/24 2245  cefTRIAXone (ROCEPHIN) 2 g in sodium chloride  0.9 % 100 mL IVPB        2 g 200 mL/hr over 30 Minutes Intravenous  Once 02/03/24 2230 02/03/24 2333        Medications  Scheduled Meds:  acetaminophen   1,000 mg Oral QID   atorvastatin   80 mg Oral QPM   ezetimibe  10 mg Oral QPM   heparin injection (subcutaneous)  5,000 Units Subcutaneous Q8H   hydrALAZINE   50 mg Oral BID   insulin  aspart  0-15 Units Subcutaneous Q4H   [START ON 02/08/2024] insulin  glargine  20 Units Subcutaneous Daily   linezolid  600 mg Oral Q12H   NIFEdipine  90 mg Oral Daily   pantoprazole   40 mg Oral Daily   sertraline   100 mg Oral Daily   traZODone   50 mg Oral QHS   Continuous Infusions:  cefTRIAXone (ROCEPHIN)  IV 2 g (02/06/24 2209)   PRN  Meds:.HYDROmorphone (DILAUDID) injection, traMADol    Subjective:   Christopher Roberson was seen and examined today.  Pain controlled.  Objective:   Vitals:   02/06/24 2313 02/07/24 0352 02/07/24 0720 02/07/24 1138  BP: 132/80 (!) 102/51 (!) 108/58 116/60  Pulse: 90 79 80 84  Resp: 18 18 14 18   Temp: 98 F (36.7 C) 98.4 F (36.9 C) 98.5 F (36.9 C) 98.8 F (37.1 C)  TempSrc: Oral Oral Oral Oral  SpO2: 91% 94% 99% 96%  Weight:      Height:        Intake/Output Summary (Last 24 hours) at 02/07/2024 1450 Last data filed at 02/07/2024 0700 Gross per 24 hour  Intake 1999.3 ml  Output 800 ml  Net 1199.3 ml   Filed Weights   02/04/24 2350 02/06/24 1311  Weight: 130 kg 130 kg     Exam General exam: Appears calm and comfortable  Respiratory system: Clear to auscultation. Respiratory effort normal. Cardiovascular system: S1 & S2 heard, RRR. Gastrointestinal system: Abdomen is nondistended, soft and nontender. Central nervous system: Alert and oriented.  Extremities: right medial thigh wound.  Skin: No rashes,  Psychiatry: Mood & affect appropriate.     Data Reviewed:  I have personally reviewed following labs and imaging studies   CBC Lab Results  Component Value Date   WBC 21.7 (H) 02/07/2024   RBC 3.70 (L) 02/07/2024   HGB 11.0 (L) 02/07/2024   HCT 33.1 (L) 02/07/2024   MCV 89.5 02/07/2024   MCH 29.7 02/07/2024   PLT 366 02/07/2024   MCHC 33.2 02/07/2024   RDW 11.9 02/07/2024   LYMPHSABS 1.3 02/07/2024   MONOABS 2.0 (H) 02/07/2024   EOSABS 0.0 02/07/2024   BASOSABS 0.1 02/07/2024     Last metabolic panel Lab Results  Component Value Date   NA 128 (L) 02/07/2024   K 4.1 02/07/2024   CL 96 (L) 02/07/2024   CO2 21 (L) 02/07/2024   BUN 24 (H) 02/07/2024   CREATININE 1.36 (H) 02/07/2024   GLUCOSE 366 (H) 02/07/2024   GFRNONAA >60 02/07/2024   GFRAA >60 10/10/2018   CALCIUM  7.8 (L) 02/07/2024   PROT 6.0 (L) 02/07/2024   ALBUMIN 2.2 (L) 02/07/2024    BILITOT 0.8  02/07/2024   ALKPHOS 71 02/07/2024   AST 14 (L) 02/07/2024   ALT 15 02/07/2024   ANIONGAP 11 02/07/2024    CBG (last 3)  Recent Labs    02/07/24 0353 02/07/24 0850 02/07/24 1138  GLUCAP 333* 282* 229*      Coagulation Profile: Recent Labs  Lab 02/03/24 2155  INR 1.1     Radiology Studies: No results found.     Elgie Butter M.D. [facility] 02/07/2024, 2:50 PM  Available via Epic secure chat 7am-7pm After 7 pm, please refer to night coverage provider listed on amion.

## 2024-02-07 NOTE — Plan of Care (Signed)
  Problem: Education: Goal: Ability to describe self-care measures that may prevent or decrease complications (Diabetes Survival Skills Education) will improve Outcome: Progressing Goal: Individualized Educational Video(s) Outcome: Progressing   Problem: Coping: Goal: Ability to adjust to condition or change in health will improve Outcome: Progressing   Problem: Fluid Volume: Goal: Ability to maintain a balanced intake and output will improve Outcome: Progressing   Problem: Health Behavior/Discharge Planning: Goal: Ability to identify and utilize available resources and services will improve Outcome: Progressing Goal: Ability to manage health-related needs will improve Outcome: Progressing   Problem: Metabolic: Goal: Ability to maintain appropriate glucose levels will improve Outcome: Progressing   Problem: Nutritional: Goal: Maintenance of adequate nutrition will improve Outcome: Progressing Goal: Progress toward achieving an optimal weight will improve Outcome: Progressing   Problem: Skin Integrity: Goal: Risk for impaired skin integrity will decrease Outcome: Progressing   Problem: Tissue Perfusion: Goal: Adequacy of tissue perfusion will improve Outcome: Progressing   Problem: Education: Goal: Knowledge of disease or condition will improve Outcome: Progressing Goal: Knowledge of secondary prevention will improve (MUST DOCUMENT ALL) Outcome: Progressing Goal: Knowledge of patient specific risk factors will improve (DELETE if not current risk factor) Outcome: Progressing   Problem: Ischemic Stroke/TIA Tissue Perfusion: Goal: Complications of ischemic stroke/TIA will be minimized Outcome: Progressing   Problem: Coping: Goal: Will verbalize positive feelings about self Outcome: Progressing Goal: Will identify appropriate support needs Outcome: Progressing   Problem: Health Behavior/Discharge Planning: Goal: Ability to manage health-related needs will  improve Outcome: Progressing Goal: Goals will be collaboratively established with patient/family Outcome: Progressing   Problem: Self-Care: Goal: Ability to participate in self-care as condition permits will improve Outcome: Progressing Goal: Verbalization of feelings and concerns over difficulty with self-care will improve Outcome: Progressing Goal: Ability to communicate needs accurately will improve Outcome: Progressing   Problem: Nutrition: Goal: Risk of aspiration will decrease Outcome: Progressing Goal: Dietary intake will improve Outcome: Progressing   Problem: Education: Goal: Knowledge of General Education information will improve Description: Including pain rating scale, medication(s)/side effects and non-pharmacologic comfort measures Outcome: Progressing   Problem: Health Behavior/Discharge Planning: Goal: Ability to manage health-related needs will improve Outcome: Progressing   Problem: Clinical Measurements: Goal: Ability to maintain clinical measurements within normal limits will improve Outcome: Progressing Goal: Will remain free from infection Outcome: Progressing Goal: Diagnostic test results will improve Outcome: Progressing Goal: Respiratory complications will improve Outcome: Progressing Goal: Cardiovascular complication will be avoided Outcome: Progressing   Problem: Activity: Goal: Risk for activity intolerance will decrease Outcome: Progressing   Problem: Nutrition: Goal: Adequate nutrition will be maintained Outcome: Progressing   Problem: Coping: Goal: Level of anxiety will decrease Outcome: Progressing   Problem: Elimination: Goal: Will not experience complications related to bowel motility Outcome: Progressing Goal: Will not experience complications related to urinary retention Outcome: Progressing   Problem: Pain Managment: Goal: General experience of comfort will improve and/or be controlled Outcome: Progressing   Problem:  Safety: Goal: Ability to remain free from injury will improve Outcome: Progressing   Problem: Skin Integrity: Goal: Risk for impaired skin integrity will decrease Outcome: Progressing   Problem: Clinical Measurements: Goal: Ability to avoid or minimize complications of infection will improve Outcome: Progressing   Problem: Skin Integrity: Goal: Skin integrity will improve Outcome: Progressing

## 2024-02-08 DIAGNOSIS — I10 Essential (primary) hypertension: Secondary | ICD-10-CM | POA: Diagnosis not present

## 2024-02-08 DIAGNOSIS — L0390 Cellulitis, unspecified: Secondary | ICD-10-CM | POA: Diagnosis not present

## 2024-02-08 DIAGNOSIS — R531 Weakness: Secondary | ICD-10-CM | POA: Diagnosis not present

## 2024-02-08 DIAGNOSIS — R2990 Unspecified symptoms and signs involving the nervous system: Secondary | ICD-10-CM | POA: Diagnosis not present

## 2024-02-08 LAB — CBC WITH DIFFERENTIAL/PLATELET
Abs Immature Granulocytes: 0.34 K/uL — ABNORMAL HIGH (ref 0.00–0.07)
Basophils Absolute: 0.1 K/uL (ref 0.0–0.1)
Basophils Relative: 0 %
Eosinophils Absolute: 0.1 K/uL (ref 0.0–0.5)
Eosinophils Relative: 1 %
HCT: 32.5 % — ABNORMAL LOW (ref 39.0–52.0)
Hemoglobin: 10.9 g/dL — ABNORMAL LOW (ref 13.0–17.0)
Immature Granulocytes: 2 %
Lymphocytes Relative: 12 %
Lymphs Abs: 2.1 K/uL (ref 0.7–4.0)
MCH: 30.1 pg (ref 26.0–34.0)
MCHC: 33.5 g/dL (ref 30.0–36.0)
MCV: 89.8 fL (ref 80.0–100.0)
Monocytes Absolute: 1.6 K/uL — ABNORMAL HIGH (ref 0.1–1.0)
Monocytes Relative: 9 %
Neutro Abs: 13.6 K/uL — ABNORMAL HIGH (ref 1.7–7.7)
Neutrophils Relative %: 76 %
Platelets: 460 K/uL — ABNORMAL HIGH (ref 150–400)
RBC: 3.62 MIL/uL — ABNORMAL LOW (ref 4.22–5.81)
RDW: 12.1 % (ref 11.5–15.5)
WBC: 17.8 K/uL — ABNORMAL HIGH (ref 4.0–10.5)
nRBC: 0 % (ref 0.0–0.2)

## 2024-02-08 LAB — GLUCOSE, CAPILLARY
Glucose-Capillary: 132 mg/dL — ABNORMAL HIGH (ref 70–99)
Glucose-Capillary: 139 mg/dL — ABNORMAL HIGH (ref 70–99)
Glucose-Capillary: 189 mg/dL — ABNORMAL HIGH (ref 70–99)
Glucose-Capillary: 123 mg/dL — ABNORMAL HIGH (ref 70–99)
Glucose-Capillary: 98 mg/dL (ref 70–99)
Glucose-Capillary: 97 mg/dL (ref 70–99)

## 2024-02-08 LAB — BASIC METABOLIC PANEL WITH GFR
Anion gap: 10 (ref 5–15)
BUN: 13 mg/dL (ref 6–20)
CO2: 24 mmol/L (ref 22–32)
Calcium: 8.3 mg/dL — ABNORMAL LOW (ref 8.9–10.3)
Chloride: 99 mmol/L (ref 98–111)
Creatinine, Ser: 1.01 mg/dL (ref 0.61–1.24)
GFR, Estimated: >60 mL/min (ref >60)
Glucose, Bld: 190 mg/dL — ABNORMAL HIGH (ref 70–99)
Potassium: 3.8 mmol/L (ref 3.5–5.1)
Sodium: 133 mmol/L — ABNORMAL LOW (ref 135–145)

## 2024-02-08 NOTE — Plan of Care (Signed)
  Problem: Education: Goal: Ability to describe self-care measures that may prevent or decrease complications (Diabetes Survival Skills Education) will improve Outcome: Progressing Goal: Individualized Educational Video(s) Outcome: Progressing   Problem: Coping: Goal: Ability to adjust to condition or change in health will improve Outcome: Progressing   Problem: Fluid Volume: Goal: Ability to maintain a balanced intake and output will improve Outcome: Progressing   Problem: Health Behavior/Discharge Planning: Goal: Ability to identify and utilize available resources and services will improve Outcome: Progressing Goal: Ability to manage health-related needs will improve Outcome: Progressing   Problem: Metabolic: Goal: Ability to maintain appropriate glucose levels will improve Outcome: Progressing   Problem: Nutritional: Goal: Maintenance of adequate nutrition will improve Outcome: Progressing Goal: Progress toward achieving an optimal weight will improve Outcome: Progressing   Problem: Skin Integrity: Goal: Risk for impaired skin integrity will decrease Outcome: Progressing   Problem: Tissue Perfusion: Goal: Adequacy of tissue perfusion will improve Outcome: Progressing   Problem: Education: Goal: Knowledge of disease or condition will improve Outcome: Progressing Goal: Knowledge of secondary prevention will improve (MUST DOCUMENT ALL) Outcome: Progressing Goal: Knowledge of patient specific risk factors will improve (DELETE if not current risk factor) Outcome: Progressing   Problem: Ischemic Stroke/TIA Tissue Perfusion: Goal: Complications of ischemic stroke/TIA will be minimized Outcome: Progressing   Problem: Coping: Goal: Will verbalize positive feelings about self Outcome: Progressing Goal: Will identify appropriate support needs Outcome: Progressing   Problem: Health Behavior/Discharge Planning: Goal: Ability to manage health-related needs will  improve Outcome: Progressing Goal: Goals will be collaboratively established with patient/family Outcome: Progressing   Problem: Self-Care: Goal: Ability to participate in self-care as condition permits will improve Outcome: Progressing Goal: Verbalization of feelings and concerns over difficulty with self-care will improve Outcome: Progressing Goal: Ability to communicate needs accurately will improve Outcome: Progressing   Problem: Nutrition: Goal: Risk of aspiration will decrease Outcome: Progressing Goal: Dietary intake will improve Outcome: Progressing   Problem: Education: Goal: Knowledge of General Education information will improve Description: Including pain rating scale, medication(s)/side effects and non-pharmacologic comfort measures Outcome: Progressing   Problem: Health Behavior/Discharge Planning: Goal: Ability to manage health-related needs will improve Outcome: Progressing   Problem: Clinical Measurements: Goal: Ability to maintain clinical measurements within normal limits will improve Outcome: Progressing Goal: Will remain free from infection Outcome: Progressing Goal: Diagnostic test results will improve Outcome: Progressing Goal: Respiratory complications will improve Outcome: Progressing Goal: Cardiovascular complication will be avoided Outcome: Progressing   Problem: Activity: Goal: Risk for activity intolerance will decrease Outcome: Progressing   Problem: Nutrition: Goal: Adequate nutrition will be maintained Outcome: Progressing   Problem: Coping: Goal: Level of anxiety will decrease Outcome: Progressing   Problem: Elimination: Goal: Will not experience complications related to bowel motility Outcome: Progressing Goal: Will not experience complications related to urinary retention Outcome: Progressing   Problem: Pain Managment: Goal: General experience of comfort will improve and/or be controlled Outcome: Progressing   Problem:  Safety: Goal: Ability to remain free from injury will improve Outcome: Progressing   Problem: Skin Integrity: Goal: Risk for impaired skin integrity will decrease Outcome: Progressing   Problem: Clinical Measurements: Goal: Ability to avoid or minimize complications of infection will improve Outcome: Progressing   Problem: Skin Integrity: Goal: Skin integrity will improve Outcome: Progressing

## 2024-02-08 NOTE — Progress Notes (Signed)
   02/08/24 2112  BiPAP/CPAP/SIPAP  BiPAP/CPAP/SIPAP Pt Type Adult  Reason BIPAP/CPAP not in use Non-compliant  BiPAP/CPAP /SiPAP Vitals  Pulse Rate 68  Resp 18  SpO2 94 %  Bilateral Breath Sounds Clear;Diminished

## 2024-02-08 NOTE — TOC Progression Note (Signed)
 Transition of Care [facility]) - Progression Note    Patient Details  Name: Christopher Roberson MRN: [id number] Date of Birth: 1976/04/21  [facility] CM/SW Contact  Andrez JULIANNA George, RN Phone Number: 02/08/2024, 11:32 AM  Clinical Narrative:     Per MD pt will be here through weekend and is returning to OR tomorrow. Pts SO will need to be educated on dressing changes prior to pt discharging home. IP Care management will arrange [facility] RN prior to d/c.  IP Care management following.  Expected Discharge Plan: Home w Home Health Services Barriers to Discharge: Continued Medical Work up               Expected Discharge Plan and Services   Discharge Planning Services: CM Consult   Living arrangements for the past 2 months: Apartment                                       Social Drivers of Health (SDOH) Interventions SDOH Screenings   Food Insecurity: No Food Insecurity (02/05/2024)  Housing: Low Risk  (02/05/2024)  Transportation Needs: No Transportation Needs (02/05/2024)  Utilities: Not At Risk (02/05/2024)  Depression (PHQ2-9): Low Risk  (05/31/2021)  Financial Resource Strain: Low Risk  (05/20/2021)  Recent Concern: Financial Resource Strain - Medium Risk (03/31/2021)   Received from [facility] visits prior to 06/25/2022.  Physical Activity: Insufficiently Active (03/31/2021)   Received from [facility] visits prior to 06/25/2022.  Social Connections: Moderately Isolated (02/05/2024)  Stress: No Stress Concern Present (05/31/2021)  Recent Concern: Stress - Stress Concern Present (03/31/2021)   Received from [facility] visits prior to 06/25/2022.  Tobacco Use: Low Risk  (02/06/2024)    Readmission Risk Interventions     No data to display

## 2024-02-08 NOTE — Progress Notes (Signed)
 [facility]                                                                               Christopher Roberson, is a 48 y.o. male, DOB - February 01, 1976, FMW:[id number] Admit date - 02/03/2024    Outpatient Primary MD for the patient is [facility] [employer]  LOS - 4  days    Brief summary   Christopher Roberson is a 48 y.o. 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  LKW 1930.  CT head showing no acute intracranial abnormality.  CTA head and neck negative for LVO. Seen by teleneurology and they are suspecting stroke recrudescence phenomenon versus recurrent ischemic stroke.  Recommend admission for stroke workup.    Assessment & Plan    Assessment and Plan: * Right sided weakness-resolved as of 02/06/2024 02/05/24 no new stroke on MRI brain. He has chronic right sided weakness due to old prior CVA.      Abscess of right thigh/ S/p I&D , on IV antibiotics. Currently waiting for cultures.  General surgery on board. Worsening pai in the thigh and febrile this am. Blood cultures will be ordered.  Plan for further debridement tomorrow.  Leukocytosis improving.     Sepsis due to cellulitis (HCC) 02/05/24 present on admission. Cellulitis and abscess right thigh.    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   Obesity, Class III, BMI 40-49.9 (morbid obesity) (HCC) 02/05/24 Body mass index is 43.59 kg/m.    Essential hypertension Optimal.    Type 2 diabetes mellitus with hyperglycemia, with long-term current use of insulin  (HCC) CBG (last 3)  Recent Labs    02/08/24 0336 02/08/24 0745 02/08/24 1153  GLUCAP 132* 139* 189*   Resume SSI.  Increase Lantus   and add novolog  3 units TIDAC.     Hypokalemia REPLACED.     Hyponatremia Improved.    AKI Resolved.    Mild normocytic anemia Monitor.      Estimated body mass index is 44.89 kg/m as calculated from the following:   Height as of this encounter: 5' 7 (1.702 m).   Weight as of this encounter: 130 kg.  Code Status: FULL CODE.  DVT Prophylaxis:  heparin injection 5,000 Units Start: 02/06/24 2200 SCD's Start: 02/04/24 0319   Level of Care: Level of care: Progressive Family Communication: none at bedside.   Disposition Plan:     Remains inpatient appropriate:  pending culture report.   Procedures:  S/p I&D 10/14 Dr. Vernetta   Consultants:   Surgery.   Antimicrobials:   Anti-infectives (From admission, onward)    Start     Dose/Rate Route Frequency Ordered Stop   02/06/24 1800  linezolid (ZYVOX) tablet 600 mg        600 mg Oral Every 12 hours 02/06/24 0926     02/04/24 2200  cefTRIAXone (ROCEPHIN) 2 g in sodium chloride  0.9 % 100 mL IVPB        2 g 200 mL/hr  over 30 Minutes Intravenous Every 24 hours 02/04/24 0334     02/04/24 0600  vancomycin (VANCOREADY) IVPB 1250 mg/250 mL  Status:  Discontinued        1,250 mg 166.7 mL/hr over 90 Minutes Intravenous Every 12 hours 02/04/24 0352 02/06/24 0926   02/04/24 0030  vancomycin (VANCOCIN) IVPB 1000 mg/200 mL premix        1,000 mg 200 mL/hr over 60 Minutes Intravenous  Once 02/04/24 0017 02/04/24 0150   02/03/24 2245  cefTRIAXone (ROCEPHIN) 2 g in sodium chloride  0.9 % 100 mL IVPB        2 g 200 mL/hr over 30 Minutes Intravenous  Once 02/03/24 2230 02/03/24 2333        Medications  Scheduled Meds:  acetaminophen   1,000 mg Oral QID   atorvastatin   80 mg Oral QPM   ezetimibe  10 mg Oral QPM   heparin injection (subcutaneous)  5,000 Units Subcutaneous Q8H   hydrALAZINE   50 mg Oral BID   insulin  aspart  0-15 Units Subcutaneous Q4H   insulin  glargine-yfgn  20 Units Subcutaneous Daily   linezolid  600 mg Oral Q12H   NIFEdipine  90 mg  Oral Daily   pantoprazole   40 mg Oral Daily   sertraline   100 mg Oral Daily   traZODone   50 mg Oral QHS   Continuous Infusions:  cefTRIAXone (ROCEPHIN)  IV 2 g (02/07/24 2128)   PRN Meds:.HYDROmorphone (DILAUDID) injection, traMADol    Subjective:   Christopher Roberson was seen and examined today.  No new complaints.  Objective:   Vitals:   02/07/24 2325 02/08/24 0336 02/08/24 0744 02/08/24 1153  BP: (!) 145/70 (!) 144/71 (!) 146/74 133/76  Pulse: 88 90 86 88  Resp: 18 18 18 18   Temp: 100 F (37.8 C) (!) 100.6 F (38.1 C) 99.6 F (37.6 C) 98.8 F (37.1 C)  TempSrc: Oral Oral Oral Oral  SpO2: 90% 90% 92% 93%  Weight:      Height:        Intake/Output Summary (Last 24 hours) at 02/08/2024 1308 Last data filed at 02/08/2024 0600 Gross per 24 hour  Intake 300 ml  Output 800 ml  Net -500 ml   Filed Weights   02/04/24 2350 02/06/24 1311  Weight: 130 kg 130 kg     Exam General exam: Appears calm and comfortable  Respiratory system: Clear to auscultation. Respiratory effort normal. Cardiovascular system: S1 & S2 heard, RRR. No JVD Gastrointestinal system: Abdomen is nondistended, soft and nontender.  Central nervous system: Alert and oriented. No focal neurological deficits. Extremities: right medial thigh wound bandaged.  Skin: No rashes,  Psychiatry: Mood & affect appropriate.      Data Reviewed:  I have personally reviewed following labs and imaging studies   CBC Lab Results  Component Value Date   WBC 17.8 (H) 02/08/2024   RBC 3.62 (L) 02/08/2024   HGB 10.9 (L) 02/08/2024   HCT 32.5 (L) 02/08/2024   MCV 89.8 02/08/2024   MCH 30.1 02/08/2024   PLT 460 (H) 02/08/2024   MCHC 33.5 02/08/2024   RDW 12.1 02/08/2024   LYMPHSABS 2.1 02/08/2024   MONOABS 1.6 (H) 02/08/2024   EOSABS 0.1 02/08/2024   BASOSABS 0.1 02/08/2024     Last metabolic panel Lab Results  Component Value Date   NA 133 (L) 02/08/2024   K 3.8 02/08/2024   CL 99 02/08/2024   CO2  24 02/08/2024   BUN 13 02/08/2024   CREATININE 1.01  02/08/2024   GLUCOSE 190 (H) 02/08/2024   GFRNONAA >60 02/08/2024   GFRAA >60 10/10/2018   CALCIUM  8.3 (L) 02/08/2024   PROT 6.0 (L) 02/07/2024   ALBUMIN 2.2 (L) 02/07/2024   BILITOT 0.8 02/07/2024   ALKPHOS 71 02/07/2024   AST 14 (L) 02/07/2024   ALT 15 02/07/2024   ANIONGAP 10 02/08/2024    CBG (last 3)  Recent Labs    02/08/24 0336 02/08/24 0745 02/08/24 1153  GLUCAP 132* 139* 189*      Coagulation Profile: Recent Labs  Lab 02/03/24 2155  INR 1.1     Radiology Studies: No results found.     Elgie Butter M.D. [facility] 02/08/2024, 1:08 PM  Available via Epic secure chat 7am-7pm After 7 pm, please refer to night coverage provider listed on amion.

## 2024-02-08 NOTE — Care Management Important Message (Signed)
 Important Message  Patient Details  Name: Codie Krogh MRN: [id number] Date of Birth: 03/16/1976   Important Message Given:  Yes - Medicare IM     Claretta Deed 02/08/2024, 3:14 PM

## 2024-02-08 NOTE — Progress Notes (Signed)
 [facility] Progress Note  2 Days Post-Op  Subjective: CC:  Still rates thigh pain as 8/10, towards his buttocks  Tolerating PO.   TMAX 100.6, CBC pending Objective: Vital signs in last 24 hours: Temp:  [98.4 F (36.9 C)-100.6 F (38.1 C)] 99.6 F (37.6 C) (10/16 0744) Pulse Rate:  [79-90] 86 (10/16 0744) Resp:  [18] 18 (10/16 0744) BP: (116-146)/(60-74) 146/74 (10/16 0744) SpO2:  [90 %-96 %] 92 % (10/16 0744) Last BM Date : 02/08/24  Intake/Output from previous day: 10/15 0701 - 10/16 0700 In: 300 [P.O.:300] Out: 800 [Urine:800] Intake/Output this shift: No intake/output data recorded.  PE: Gen:  Alert, NAD, pleasant, fluent speech Card:  Regular rate and rhythm Pulm:  Normal effort Abd: Soft, non-tender, non-distended Skin: warm and dry, thigh wound with dressing c/d/I, there is induration and tenderness of the right thigh tracking posteriorly towards the perineum  Psych: A&Ox3   Lab Results:  Recent Labs    02/06/24 0122 02/07/24 0252  WBC 19.2* 21.7*  HGB 11.5* 11.0*  HCT 33.7* 33.1*  PLT 336 366   BMET Recent Labs    02/06/24 0122 02/07/24 0252  NA 132* 128*  K 3.2* 4.1  CL 97* 96*  CO2 23 21*  GLUCOSE 274* 366*  BUN 13 24*  CREATININE 1.58* 1.36*  CALCIUM  8.2* 7.8*   PT/INR No results for input(s): LABPROT, INR in the last 72 hours.  CMP     Component Value Date/Time   NA 128 (L) 02/07/2024 0252   K 4.1 02/07/2024 0252   CL 96 (L) 02/07/2024 0252   CO2 21 (L) 02/07/2024 0252   GLUCOSE 366 (H) 02/07/2024 0252   BUN 24 (H) 02/07/2024 0252   CREATININE 1.36 (H) 02/07/2024 0252   CREATININE 0.78 06/26/2023 0859   CALCIUM  7.8 (L) 02/07/2024 0252   PROT 6.0 (L) 02/07/2024 0252   ALBUMIN 2.2 (L) 02/07/2024 0252   AST 14 (L) 02/07/2024 0252   ALT 15 02/07/2024 0252   ALKPHOS 71 02/07/2024 0252   BILITOT 0.8 02/07/2024 0252   GFRNONAA >60 02/07/2024 0252   GFRAA >60 10/10/2018 0330   Lipase     Component Value  Date/Time   LIPASE 31 05/23/2015 2010       Studies/Results: No results found.   Anti-infectives: Anti-infectives (From admission, onward)    Start     Dose/Rate Route Frequency Ordered Stop   02/06/24 1800  linezolid (ZYVOX) tablet 600 mg        600 mg Oral Every 12 hours 02/06/24 0926     02/04/24 2200  cefTRIAXone (ROCEPHIN) 2 g in sodium chloride  0.9 % 100 mL IVPB        2 g 200 mL/hr over 30 Minutes Intravenous Every 24 hours 02/04/24 0334     02/04/24 0600  vancomycin (VANCOREADY) IVPB 1250 mg/250 mL  Status:  Discontinued        1,250 mg 166.7 mL/hr over 90 Minutes Intravenous Every 12 hours 02/04/24 0352 02/06/24 0926   02/04/24 0030  vancomycin (VANCOCIN) IVPB 1000 mg/200 mL premix        1,000 mg 200 mL/hr over 60 Minutes Intravenous  Once 02/04/24 0017 02/04/24 0150   02/03/24 2245  cefTRIAXone (ROCEPHIN) 2 g in sodium chloride  0.9 % 100 mL IVPB        2 g 200 mL/hr over 30 Minutes Intravenous  Once 02/03/24 2230 02/03/24 2333        Assessment/Plan Right medial thigh abscess S/p  I&D 10/14 Dr. Vernetta  POD#2 - WBC pending today - low grade fever 100.6, HD stable - cultures w/ GPC, GNR, follow - concerned for possible residual abscess due to continued pain/induration as above, along with low grade fever, will perform wound check early tomorrow AM, may need to go back to the OR for a second look tomorrow 10/17 - continue IV Abx - continue BID wet-to-dry dressing changes, continue education on wound care for home.   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     LOS: 4 days   I reviewed nursing notes, hospitalist notes, last 24 h vitals and pain scores, last 48 h intake and output, last 24 h labs and trends, and last 24 h imaging results.  This care required moderate level of medical decision making.   Almarie Pringle, PA-C [facility] Please see Amion for pager number during day hours 7:00am-4:30pm

## 2024-02-09 ENCOUNTER — Inpatient Hospital Stay (HOSPITAL_COMMUNITY): Admitting: Anesthesiology

## 2024-02-09 ENCOUNTER — Encounter (HOSPITAL_COMMUNITY)
Admission: EM | Disposition: A | Discharge status: Home Health | Payer: Self-pay | Source: Home / Self Care | Attending: Internal Medicine

## 2024-02-09 ENCOUNTER — Encounter (HOSPITAL_COMMUNITY): Payer: Self-pay | Admitting: Internal Medicine

## 2024-02-09 DIAGNOSIS — E876 Hypokalemia: Secondary | ICD-10-CM | POA: Diagnosis not present

## 2024-02-09 DIAGNOSIS — I10 Essential (primary) hypertension: Secondary | ICD-10-CM

## 2024-02-09 DIAGNOSIS — L0390 Cellulitis, unspecified: Secondary | ICD-10-CM | POA: Diagnosis not present

## 2024-02-09 DIAGNOSIS — R531 Weakness: Secondary | ICD-10-CM | POA: Diagnosis not present

## 2024-02-09 DIAGNOSIS — L02413 Cutaneous abscess of right upper limb: Secondary | ICD-10-CM | POA: Diagnosis not present

## 2024-02-09 DIAGNOSIS — F418 Other specified anxiety disorders: Secondary | ICD-10-CM

## 2024-02-09 HISTORY — PX: IRRIGATION AND DEBRIDEMENT ABSCESS: SHX5252

## 2024-02-09 LAB — CBC WITH DIFFERENTIAL/PLATELET
Abs Immature Granulocytes: 0.47 K/uL — ABNORMAL HIGH (ref 0.00–0.07)
Basophils Absolute: 0.1 K/uL (ref 0.0–0.1)
Basophils Relative: 1 %
Eosinophils Absolute: 0 K/uL (ref 0.0–0.5)
Eosinophils Relative: 0 %
HCT: 35.2 % — ABNORMAL LOW (ref 39.0–52.0)
Hemoglobin: 11.7 g/dL — ABNORMAL LOW (ref 13.0–17.0)
Immature Granulocytes: 3 %
Lymphocytes Relative: 7 %
Lymphs Abs: 1.1 K/uL (ref 0.7–4.0)
MCH: 29.8 pg (ref 26.0–34.0)
MCHC: 33.2 g/dL (ref 30.0–36.0)
MCV: 89.8 fL (ref 80.0–100.0)
Monocytes Absolute: 0.7 K/uL (ref 0.1–1.0)
Monocytes Relative: 4 %
Neutro Abs: 13.9 K/uL — ABNORMAL HIGH (ref 1.7–7.7)
Neutrophils Relative %: 85 %
Platelets: 491 K/uL — ABNORMAL HIGH (ref 150–400)
RBC: 3.92 MIL/uL — ABNORMAL LOW (ref 4.22–5.81)
RDW: 12.1 % (ref 11.5–15.5)
WBC: 16.3 K/uL — ABNORMAL HIGH (ref 4.0–10.5)
nRBC: 0 % (ref 0.0–0.2)

## 2024-02-09 LAB — CULTURE, BLOOD (ROUTINE X 2)
Culture: NO GROWTH
Culture: NO GROWTH
Special Requests: ADEQUATE
Special Requests: ADEQUATE

## 2024-02-09 LAB — GLUCOSE, CAPILLARY
Glucose-Capillary: 118 mg/dL — ABNORMAL HIGH (ref 70–99)
Glucose-Capillary: 118 mg/dL — ABNORMAL HIGH (ref 70–99)
Glucose-Capillary: 123 mg/dL — ABNORMAL HIGH (ref 70–99)
Glucose-Capillary: 125 mg/dL — ABNORMAL HIGH (ref 70–99)
Glucose-Capillary: 298 mg/dL — ABNORMAL HIGH (ref 70–99)
Glucose-Capillary: 320 mg/dL — ABNORMAL HIGH (ref 70–99)
Glucose-Capillary: 386 mg/dL — ABNORMAL HIGH (ref 70–99)

## 2024-02-09 LAB — BASIC METABOLIC PANEL WITH GFR
Anion gap: 13 (ref 5–15)
BUN: 9 mg/dL (ref 6–20)
CO2: 24 mmol/L (ref 22–32)
Calcium: 8.7 mg/dL — ABNORMAL LOW (ref 8.9–10.3)
Chloride: 98 mmol/L (ref 98–111)
Creatinine, Ser: 0.84 mg/dL (ref 0.61–1.24)
GFR, Estimated: >60 mL/min (ref >60)
Glucose, Bld: 226 mg/dL — ABNORMAL HIGH (ref 70–99)
Potassium: 4.5 mmol/L (ref 3.5–5.1)
Sodium: 135 mmol/L (ref 135–145)

## 2024-02-09 LAB — AEROBIC/ANAEROBIC CULTURE W GRAM STAIN (SURGICAL/DEEP WOUND): Gram Stain: NONE SEEN

## 2024-02-09 SURGERY — IRRIGATION AND DEBRIDEMENT ABSCESS
Anesthesia: General | Site: Groin | Laterality: Right

## 2024-02-09 MED ORDER — DEXAMETHASONE SOD PHOSPHATE PF 10 MG/ML IJ SOLN
INTRAMUSCULAR | Status: DC | PRN
  Administered 2024-02-09: 10 mg via INTRAVENOUS

## 2024-02-09 MED ORDER — LIDOCAINE HCL (CARDIAC) PF 100 MG/5ML IV SOSY
PREFILLED_SYRINGE | INTRAVENOUS | Status: DC | PRN
  Administered 2024-02-09: 80 mg via INTRATRACHEAL

## 2024-02-09 MED ORDER — BUPIVACAINE-EPINEPHRINE (PF) 0.25% -1:200000 IJ SOLN
INTRAMUSCULAR | Status: AC
  Filled 2024-02-09: qty 30

## 2024-02-09 MED ORDER — MIDAZOLAM HCL (PF) 2 MG/2ML IJ SOLN
INTRAMUSCULAR | Status: DC | PRN
  Administered 2024-02-09: 2 mg via INTRAVENOUS

## 2024-02-09 MED ORDER — FENTANYL CITRATE (PF) 100 MCG/2ML IJ SOLN
INTRAMUSCULAR | Status: AC
  Filled 2024-02-09: qty 2

## 2024-02-09 MED ORDER — MIDAZOLAM HCL 2 MG/2ML IJ SOLN
INTRAMUSCULAR | Status: AC
  Filled 2024-02-09: qty 2

## 2024-02-09 MED ORDER — OXYCODONE HCL 5 MG PO TABS
5.0000 mg | ORAL_TABLET | Freq: Four times a day (QID) | ORAL | Status: AC | PRN
  Administered 2024-02-09 – 2024-02-10 (×3): 10 mg via ORAL
  Administered 2024-02-10: 5 mg via ORAL
  Filled 2024-02-09 (×4): qty 2

## 2024-02-09 MED ORDER — PROPOFOL 10 MG/ML IV BOLUS
INTRAVENOUS | Status: AC
  Filled 2024-02-09: qty 20

## 2024-02-09 MED ORDER — CHLORHEXIDINE GLUCONATE 0.12 % MT SOLN
15.0000 mL | Freq: Once | OROMUCOSAL | Status: AC
  Administered 2024-02-09: 15 mL via OROMUCOSAL

## 2024-02-09 MED ORDER — CHLORHEXIDINE GLUCONATE 0.12 % MT SOLN
OROMUCOSAL | Status: AC
  Filled 2024-02-09: qty 15

## 2024-02-09 MED ORDER — 0.9 % SODIUM CHLORIDE (POUR BTL) OPTIME
TOPICAL | Status: DC | PRN
  Administered 2024-02-09: 1000 mL

## 2024-02-09 MED ORDER — PROPOFOL 10 MG/ML IV BOLUS
INTRAVENOUS | Status: DC | PRN
  Administered 2024-02-09: 180 mg via INTRAVENOUS

## 2024-02-09 MED ORDER — FENTANYL CITRATE (PF) 250 MCG/5ML IJ SOLN
INTRAMUSCULAR | Status: AC
  Filled 2024-02-09: qty 5

## 2024-02-09 MED ORDER — FENTANYL CITRATE (PF) 100 MCG/2ML IJ SOLN
25.0000 ug | INTRAMUSCULAR | Status: DC | PRN
  Administered 2024-02-09 (×3): 50 ug via INTRAVENOUS

## 2024-02-09 MED ORDER — DIPHENHYDRAMINE HCL 25 MG PO CAPS
25.0000 mg | ORAL_CAPSULE | Freq: Four times a day (QID) | ORAL | Status: DC | PRN
  Administered 2024-02-09 – 2024-02-10 (×4): 25 mg via ORAL
  Filled 2024-02-09 (×4): qty 1

## 2024-02-09 MED ORDER — ONDANSETRON HCL 4 MG/2ML IJ SOLN
INTRAMUSCULAR | Status: DC | PRN
  Administered 2024-02-09: 4 mg via INTRAVENOUS

## 2024-02-09 MED ORDER — LACTATED RINGERS IV SOLN: INTRAVENOUS | Status: DC

## 2024-02-09 MED ORDER — FENTANYL CITRATE (PF) 250 MCG/5ML IJ SOLN
INTRAMUSCULAR | Status: DC | PRN
  Administered 2024-02-09 (×2): 50 ug via INTRAVENOUS

## 2024-02-09 MED ORDER — SODIUM CHLORIDE 0.9 % IV SOLN
3.0000 g | Freq: Four times a day (QID) | INTRAVENOUS | Status: DC
  Administered 2024-02-09 – 2024-02-12 (×12): 3 g via INTRAVENOUS
  Filled 2024-02-09 (×12): qty 8

## 2024-02-09 MED ORDER — ORAL CARE MOUTH RINSE: 15.0000 mL | Freq: Once | OROMUCOSAL | Status: AC

## 2024-02-09 MED ORDER — INSULIN ASPART 100 UNIT/ML IJ SOLN: 0.0000 [IU] | INTRAMUSCULAR | Status: DC | PRN

## 2024-02-09 MED ADMIN — Bupivacaine Inj 0.25% w/ Epinephrine 1:200000: 20 mL | NDC 63323046157

## 2024-02-09 SURGICAL SUPPLY — 21 items
BAG COUNTER SPONGE SURGICOUNT (BAG) ×1 IMPLANT
BNDG GAUZE DERMACEA FLUFF 4 (GAUZE/BANDAGES/DRESSINGS) IMPLANT
CANISTER SUCTION 3000ML PPV (SUCTIONS) ×1 IMPLANT
COVER SURGICAL LIGHT HANDLE (MISCELLANEOUS) ×1 IMPLANT
DRAPE LAPAROTOMY 100X72 PEDS (DRAPES) IMPLANT
ELECTRODE REM PT RTRN 9FT ADLT (ELECTROSURGICAL) ×1 IMPLANT
GAUZE SPONGE 4X4 12PLY STRL (GAUZE/BANDAGES/DRESSINGS) IMPLANT
GLOVE SURG SIGNA 7.5 PF LTX (GLOVE) IMPLANT
GOWN STRL REUS W/ TWL LRG LVL3 (GOWN DISPOSABLE) ×2 IMPLANT
KIT BASIN OR (CUSTOM PROCEDURE TRAY) ×1 IMPLANT
KIT TURNOVER KIT B (KITS) ×1 IMPLANT
NDL HYPO 25GX1X1/2 BEV (NEEDLE) IMPLANT
NEEDLE HYPO 25GX1X1/2 BEV (NEEDLE) IMPLANT
PACK GENERAL/GYN (CUSTOM PROCEDURE TRAY) ×1 IMPLANT
PAD ABD 8X10 STRL (GAUZE/BANDAGES/DRESSINGS) IMPLANT
PAD ARMBOARD POSITIONER FOAM (MISCELLANEOUS) ×1 IMPLANT
SOLN 0.9% NACL 1000 ML (IV SOLUTION) ×1 IMPLANT
SOLN 0.9% NACL POUR BTL 1000ML (IV SOLUTION) ×1 IMPLANT
SYR CONTROL 10ML LL (SYRINGE) IMPLANT
TOWEL GREEN STERILE (TOWEL DISPOSABLE) ×1 IMPLANT
TOWEL GREEN STERILE FF (TOWEL DISPOSABLE) ×1 IMPLANT

## 2024-02-09 NOTE — Progress Notes (Signed)
 [facility]                                                                               [name], is a 48 y.o. male, DOB - 1975-12-07, FMW:[id number] Admit date - 02/03/2024    Outpatient Primary MD for the patient is [facility] [employer]  LOS - 5  days    Brief summary   Christopher Roberson is a 48 y.o. 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  LKW 1930.  CT head showing no acute intracranial abnormality.  CTA head and neck negative for LVO. Seen by teleneurology and they are suspecting stroke recrudescence phenomenon versus recurrent ischemic stroke.  Recommend admission for stroke workup.    Assessment & Plan    Assessment and Plan: * Right sided weakness-resolved as of 02/06/2024 02/05/24 no new stroke on MRI brain. He has chronic right sided weakness due to old prior CVA.   Abscess of right thigh/ S/p I&D , on IV antibiotics. Currently waiting for cultures.  General surgery on board. Worsening pain in the thigh and febrile this am. Blood cultures will be ordered.  Patient underwent repeat debridement, and drainage of the abscess of the right thigh abscess.  Leukocytosis improving.     Sepsis due to cellulitis (HCC) 02/05/24 present on admission. Cellulitis and abscess right thigh.    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   Obesity, Class III, BMI 40-49.9 (morbid obesity) (HCC) 02/05/24 Body mass index is 43.59 kg/m.    Essential hypertension Well controlled.    Type 2 diabetes mellitus with hyperglycemia, with long-term current use of insulin  (HCC) CBG (last 3)  Recent Labs    02/09/24 0805 02/09/24 1047 02/09/24 1512  GLUCAP 123* 125* 320*   Resume SSI.   Increase Lantus   and add novolog  3 units TIDAC.  No changes to meds.    Hypokalemia Replaced.     Hyponatremia Resolved.    AKI Resolved.    Mild normocytic anemia Monitor.  Hemoglobin stable around 11.         Estimated body mass index is 44.89 kg/m as calculated from the following:   Height as of this encounter: 5' 7 (1.702 m).   Weight as of this encounter: 130 kg.  Code Status: FULL CODE.  DVT Prophylaxis:  heparin injection 5,000 Units Start: 02/06/24 2200 SCD's Start: 02/04/24 0319   Level of Care: Level of care: Progressive Family Communication: none at bedside.   Disposition Plan:     Remains inpatient appropriate:  pending culture report.   Procedures:  S/p I&D 10/14 Dr. Vernetta   Consultants:   Surgery.   Antimicrobials:   Anti-infectives (From admission, onward)    Start     Dose/Rate Route Frequency Ordered Stop   02/09/24 1300  Ampicillin-Sulbactam (UNASYN) 3 g in sodium chloride  0.9 % 100 mL IVPB        3 g 200 mL/hr over 30 Minutes Intravenous Every 6 hours  02/09/24 1203     02/06/24 1800  linezolid (ZYVOX) tablet 600 mg  Status:  Discontinued        600 mg Oral Every 12 hours 02/06/24 0926 02/09/24 1203   02/04/24 2200  cefTRIAXone (ROCEPHIN) 2 g in sodium chloride  0.9 % 100 mL IVPB  Status:  Discontinued        2 g 200 mL/hr over 30 Minutes Intravenous Every 24 hours 02/04/24 0334 02/09/24 1203   02/04/24 0600  vancomycin (VANCOREADY) IVPB 1250 mg/250 mL  Status:  Discontinued        1,250 mg 166.7 mL/hr over 90 Minutes Intravenous Every 12 hours 02/04/24 0352 02/06/24 0926   02/04/24 0030  vancomycin (VANCOCIN) IVPB 1000 mg/200 mL premix        1,000 mg 200 mL/hr over 60 Minutes Intravenous  Once 02/04/24 0017 02/04/24 0150   02/03/24 2245  cefTRIAXone (ROCEPHIN) 2 g in sodium chloride  0.9 % 100 mL IVPB        2 g 200 mL/hr over 30 Minutes Intravenous  Once 02/03/24 2230 02/03/24 2333        Medications  Scheduled  Meds:  acetaminophen   1,000 mg Oral QID   atorvastatin   80 mg Oral QPM   ezetimibe  10 mg Oral QPM   heparin injection (subcutaneous)  5,000 Units Subcutaneous Q8H   hydrALAZINE   50 mg Oral BID   insulin  aspart  0-15 Units Subcutaneous Q4H   insulin  glargine-yfgn  20 Units Subcutaneous Daily   NIFEdipine  90 mg Oral Daily   pantoprazole   40 mg Oral Daily   sertraline   100 mg Oral Daily   traZODone   50 mg Oral QHS   Continuous Infusions:  ampicillin-sulbactam (UNASYN) IV 3 g (02/09/24 1339)   PRN Meds:.HYDROmorphone (DILAUDID) injection, traMADol    Subjective:   Christopher Roberson was seen and examined today.   No new complaints.  Objective:   Vitals:   02/09/24 1015 02/09/24 1030 02/09/24 1049 02/09/24 1514  BP: (!) 168/89 (!) 171/89 (!) 151/82 130/74  Pulse: 74 68 75 72  Resp: (!) 24 (!) 21 18 18   Temp: 99 F (37.2 C)  98.6 F (37 C) 98.4 F (36.9 C)  TempSrc:   Oral Oral  SpO2: 95% 97% (!) 88% 91%  Weight:      Height:        Intake/Output Summary (Last 24 hours) at 02/09/2024 1620 Last data filed at 02/09/2024 1600 Gross per 24 hour  Intake 928.72 ml  Output 2110 ml  Net -1181.28 ml   Filed Weights   02/04/24 2350 02/06/24 1311  Weight: 130 kg 130 kg     Exam General exam: Appears calm and comfortable  Respiratory system: Clear to auscultation. Respiratory effort normal. Cardiovascular system: S1 & S2 heard, RRR. No JVD, Gastrointestinal system: Abdomen is nondistended, soft and nontender.  Central nervous system: Alert and oriented.  Extremities: right thigh wound bandaged.  Skin: No rashes,  Psychiatry:  Mood & affect appropriate.      Data Reviewed:  I have personally reviewed following labs and imaging studies   CBC Lab Results  Component Value Date   WBC 16.3 (H) 02/09/2024   RBC 3.92 (L) 02/09/2024   HGB 11.7 (L) 02/09/2024   HCT 35.2 (L) 02/09/2024   MCV 89.8 02/09/2024   MCH 29.8 02/09/2024   PLT 491 (H) 02/09/2024   MCHC 33.2  02/09/2024   RDW 12.1 02/09/2024   LYMPHSABS 1.1 02/09/2024   MONOABS 0.7  02/09/2024   EOSABS 0.0 02/09/2024   BASOSABS 0.1 02/09/2024     Last metabolic panel Lab Results  Component Value Date   NA 135 02/09/2024   K 4.5 02/09/2024   CL 98 02/09/2024   CO2 24 02/09/2024   BUN 9 02/09/2024   CREATININE 0.84 02/09/2024   GLUCOSE 226 (H) 02/09/2024   GFRNONAA >60 02/09/2024   GFRAA >60 10/10/2018   CALCIUM  8.7 (L) 02/09/2024   PROT 6.0 (L) 02/07/2024   ALBUMIN 2.2 (L) 02/07/2024   BILITOT 0.8 02/07/2024   ALKPHOS 71 02/07/2024   AST 14 (L) 02/07/2024   ALT 15 02/07/2024   ANIONGAP 13 02/09/2024    CBG (last 3)  Recent Labs    02/09/24 0805 02/09/24 1047 02/09/24 1512  GLUCAP 123* 125* 320*      Coagulation Profile: Recent Labs  Lab 02/03/24 2155  INR 1.1     Radiology Studies: No results found.     Elgie Butter M.D. [facility] 02/09/2024, 4:20 PM  Available via Epic secure chat 7am-7pm After 7 pm, please refer to night coverage provider listed on amion.

## 2024-02-09 NOTE — Anesthesia Postprocedure Evaluation (Signed)
 Anesthesia Post Note  Patient: Christopher Roberson  Procedure(s) Performed: IRRIGATION AND DEBRIDEMENT ABSCESS (Right: Groin)     Patient location during evaluation: PACU Anesthesia Type: General Level of consciousness: awake and alert Pain management: pain level controlled Vital Signs Assessment: post-procedure vital signs reviewed and stable Respiratory status: spontaneous breathing, nonlabored ventilation and respiratory function stable Cardiovascular status: blood pressure returned to baseline and stable Postop Assessment: no apparent nausea or vomiting Anesthetic complications: no   No notable events documented.  Last Vitals:  Vitals:   02/09/24 0815 02/09/24 0830  BP: (!) 175/90 (!) 180/89  Pulse: 86 83  Resp: (!) 21 (!) 26  Temp:    SpO2: 96% 95%    Last Pain:  Vitals:   02/09/24 0830  TempSrc:   PainSc: Asleep                 Uno Esau

## 2024-02-09 NOTE — Progress Notes (Signed)
   02/09/24 2202  BiPAP/CPAP/SIPAP  BiPAP/CPAP/SIPAP Pt Type Adult  BiPAP/CPAP/SIPAP Resmed  Reason BIPAP/CPAP not in use Non-compliant  BiPAP/CPAP /SiPAP Vitals  Pulse Rate (!) 56  Resp 17  SpO2 94 %  Bilateral Breath Sounds Clear

## 2024-02-09 NOTE — Progress Notes (Signed)
 Currently on linezolid/ceftriaxone for wound infection. S/p 2nd debridement. 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   D/w Dr. Akula and we will optimize abx to Unasyn as it's likely to cover both.  Sergio Batch, PharmD, BCIDP, AAHIVP, CPP Infectious Disease Pharmacist 02/09/2024 12:05 PM

## 2024-02-09 NOTE — Transfer of Care (Signed)
 Immediate Anesthesia Transfer of Care Note  Patient: Christopher Roberson  Procedure(s) Performed: IRRIGATION AND DEBRIDEMENT ABSCESS (Right: Groin)  Patient Location: PACU  Anesthesia Type:General  Level of Consciousness: drowsy and patient cooperative  Airway & Oxygen Therapy: Patient Spontanous Breathing and Patient connected to nasal cannula oxygen  Post-op Assessment: Report given to RN, Post -op Vital signs reviewed and stable, and Patient moving all extremities X 4  Post vital signs: Reviewed and stable  Last Vitals:  Vitals Value Taken Time  BP 170/92 02/09/24 08:04  Temp    Pulse 85 02/09/24 08:07  Resp 27 02/09/24 08:07  SpO2 93 % 02/09/24 08:07  Vitals shown include unfiled device data.  Last Pain:  Vitals:   02/08/24 2330  TempSrc:   PainSc: 0-No pain         Complications: No notable events documented.

## 2024-02-09 NOTE — Anesthesia Preprocedure Evaluation (Signed)
 Anesthesia Evaluation  Patient identified by MRN, date of birth, ID band Patient awake    Reviewed: Allergy & Precautions, NPO status , Patient's Chart, lab work & pertinent test results  History of Anesthesia Complications Negative for: history of anesthetic complications  Airway Mallampati: II  TM Distance: >3 FB Neck ROM: Full    Dental  (+) Dental Advisory Given, Teeth Intact   Pulmonary neg shortness of breath, sleep apnea , neg COPD, neg recent URI   breath sounds clear to auscultation       Cardiovascular hypertension, Pt. on medications (-) angina (-) Past MI and (-) CHF  Rhythm:Regular   1. Left ventricular ejection fraction, by estimation, is 60 to 65%. The  left ventricle has normal function. The left ventricle has no regional  wall motion abnormalities. There is moderate concentric left ventricular  hypertrophy. Left ventricular  diastolic parameters were normal.   2. Right ventricular systolic function is normal. The right ventricular  size is normal. There is normal pulmonary artery systolic pressure. The  estimated right ventricular systolic pressure is 7.2 mmHg.   3. Left atrial size was mildly dilated.   4. The mitral valve is grossly normal. No evidence of mitral valve  regurgitation. No evidence of mitral stenosis.   5. The aortic valve is tricuspid. Aortic valve regurgitation is not  visualized. No aortic stenosis is present.   6. Aortic dilatation noted. There is mild dilatation of the aortic root,  measuring 41 mm.   7. The inferior vena cava is normal in size with greater than 50%  respiratory variability, suggesting right atrial pressure of 3 mmHg.     Neuro/Psych  PSYCHIATRIC DISORDERS Anxiety Depression     Neuromuscular disease CVA, No Residual Symptoms    GI/Hepatic Neg liver ROS,GERD  Medicated and Controlled,,  Endo/Other  diabetes, Type 2, Insulin  Dependent  Class 3 obesity   Renal/GU negative Renal ROSLab Results      Component                Value               Date                      NA                       133 (L)             02/08/2024                K                        3.8                 02/08/2024                CO2                      24                  02/08/2024                GLUCOSE                  190 (H)             02/08/2024  BUN                      13                  02/08/2024                CREATININE               1.01                02/08/2024                CALCIUM                   8.3 (L)             02/08/2024                GFRNONAA                 >60                 02/08/2024                Musculoskeletal negative musculoskeletal ROS (+)    Abdominal   Peds  Hematology  (+) Blood dyscrasia, anemia Lab Results      Component                Value               Date                      WBC                      17.8 (H)            02/08/2024                HGB                      10.9 (L)            02/08/2024                HCT                      32.5 (L)            02/08/2024                MCV                      89.8                02/08/2024                PLT                      460 (H)             02/08/2024              Anesthesia Other Findings   Reproductive/Obstetrics                              Anesthesia Physical Anesthesia Plan  ASA: 3  Anesthesia Plan: General   Post-op Pain Management: Ofirmev  IV (intra-op)*   Induction: Intravenous  PONV Risk Score and  Plan: 3 and Ondansetron  and Midazolam  Airway Management Planned: LMA and Oral ETT  Additional Equipment: None  Intra-op Plan:   Post-operative Plan: Extubation in OR  Informed Consent: I have reviewed the patients History and Physical, chart, labs and discussed the procedure including the risks, benefits and alternatives for the proposed anesthesia with the patient or authorized  representative who has indicated his/her understanding and acceptance.     Dental advisory given  Plan Discussed with: CRNA  Anesthesia Plan Comments:          Anesthesia Quick Evaluation

## 2024-02-09 NOTE — Plan of Care (Signed)

## 2024-02-09 NOTE — Anesthesia Procedure Notes (Signed)
 Procedure Name: LMA Insertion Date/Time: 02/09/2024 7:29 AM  Performed by: Lamar Lucie DASEN, CRNAPre-anesthesia Checklist: Patient identified, Emergency Drugs available, Suction available and Patient being monitored Patient Re-evaluated:Patient Re-evaluated prior to induction Oxygen Delivery Method: Circle system utilized Preoxygenation: Pre-oxygenation with 100% oxygen Induction Type: IV induction Ventilation: Mask ventilation without difficulty LMA: LMA inserted LMA Size: 5.0 Number of attempts: 1 Airway Equipment and Method: Oral airway Placement Confirmation: positive ETCO2, breath sounds checked- equal and bilateral and CO2 detector Tube secured with: Tape Dental Injury: Teeth and Oropharynx as per pre-operative assessment

## 2024-02-09 NOTE — Op Note (Signed)
   Christopher Roberson 02/09/2024   Pre-op Diagnosis: right thigh wound/abscess     Post-op Diagnosis: same  Procedure(s): RIGHT THIGH DRESSING CHANGE WITH FURTHER INCISION, DRAINAGE, IRRIGATION OF RIGHT THIGH ABSCESS   Surgeon(s): Vernetta Berg, MD  Anesthesia: General  Staff:  Circulator: Storm Maryelizabeth CROME, RN Scrub Person: Bari Birmingham, RN; Gail Mungo E  Estimated Blood Loss: Minimal               Indications: This is a 48 year old gentleman with multimedical problems including end-stage renal disease on dialysis, obesity, and diabetes who presented with cellulitis of the right thigh.  He underwent incision and drainage of the right thigh large abscess on 10/14.  Because of persistent erythema and induration at the most superior and inferior portions of the wound and skin, the decision was made to reexplore the wound in the operating room  Findings: The patient was found to have some extension of thin purulent fluid both superiorly along the incision of the thigh as well as inferiorly toward the buttock.  There was no necrosis or evidence of necrotizing infection  Procedure: The patient was brought to the operating room and identified as the correct patient.  He was placed upon the operating table and general anesthesia was induced.  We removed the packing from the right thigh.  We then frog-legged the right leg.  He was then prepped and draped in usual sterile fashion.  I explored the wound and found some thin purulent fluid moving superiorly so with a #10 scalpel I opened the wound further and then dissected down to the deep subcutaneous tissue.  There was no necrosis in this area.  I then evaluated the wound inferiorly and the deep aspects and found slightly more thin purulent fluid going deep and inferiorly.  I opened the wound further and with blunt dissection open the deeper aspect.  This relieved some more of the abscess fluid.  There was no evidence of necrosis or extension  of the muscle in this area either.  I then achieved hemostasis with cautery.  We then irrigated the wound with a liter of normal saline.  Hemostasis appeared to be achieved.  I then packed the wound with a 3 inch Kerlix gauze.  Dry gauze and an ABD were placed over this.  The patient tolerated the procedure well.  All the counts were correct at the end of the procedure.  The patient was then extubated in the operating room and taken in stable condition to the recovery room.          [name]   Date: 02/09/2024  Time: 7:55 AM

## 2024-02-09 NOTE — Plan of Care (Signed)
  Problem: Education: Goal: Ability to describe self-care measures that may prevent or decrease complications (Diabetes Survival Skills Education) will improve Outcome: Progressing Goal: Individualized Educational Video(s) Outcome: Progressing   Problem: Coping: Goal: Ability to adjust to condition or change in health will improve Outcome: Progressing   Problem: Fluid Volume: Goal: Ability to maintain a balanced intake and output will improve Outcome: Progressing   Problem: Health Behavior/Discharge Planning: Goal: Ability to identify and utilize available resources and services will improve Outcome: Progressing Goal: Ability to manage health-related needs will improve Outcome: Progressing   Problem: Metabolic: Goal: Ability to maintain appropriate glucose levels will improve Outcome: Progressing   Problem: Nutritional: Goal: Maintenance of adequate nutrition will improve Outcome: Progressing Goal: Progress toward achieving an optimal weight will improve Outcome: Progressing   Problem: Skin Integrity: Goal: Risk for impaired skin integrity will decrease Outcome: Progressing   Problem: Tissue Perfusion: Goal: Adequacy of tissue perfusion will improve Outcome: Progressing   Problem: Education: Goal: Knowledge of disease or condition will improve Outcome: Progressing Goal: Knowledge of secondary prevention will improve (MUST DOCUMENT ALL) Outcome: Progressing Goal: Knowledge of patient specific risk factors will improve (DELETE if not current risk factor) Outcome: Progressing   Problem: Ischemic Stroke/TIA Tissue Perfusion: Goal: Complications of ischemic stroke/TIA will be minimized Outcome: Progressing   Problem: Coping: Goal: Will verbalize positive feelings about self Outcome: Progressing Goal: Will identify appropriate support needs Outcome: Progressing   Problem: Health Behavior/Discharge Planning: Goal: Ability to manage health-related needs will  improve Outcome: Progressing Goal: Goals will be collaboratively established with patient/family Outcome: Progressing   Problem: Self-Care: Goal: Ability to participate in self-care as condition permits will improve Outcome: Progressing Goal: Verbalization of feelings and concerns over difficulty with self-care will improve Outcome: Progressing Goal: Ability to communicate needs accurately will improve Outcome: Progressing   Problem: Nutrition: Goal: Risk of aspiration will decrease Outcome: Progressing Goal: Dietary intake will improve Outcome: Progressing   Problem: Education: Goal: Knowledge of General Education information will improve Description: Including pain rating scale, medication(s)/side effects and non-pharmacologic comfort measures Outcome: Progressing   Problem: Health Behavior/Discharge Planning: Goal: Ability to manage health-related needs will improve Outcome: Progressing   Problem: Clinical Measurements: Goal: Ability to maintain clinical measurements within normal limits will improve Outcome: Progressing Goal: Will remain free from infection Outcome: Progressing Goal: Diagnostic test results will improve Outcome: Progressing Goal: Respiratory complications will improve Outcome: Progressing Goal: Cardiovascular complication will be avoided Outcome: Progressing   Problem: Activity: Goal: Risk for activity intolerance will decrease Outcome: Progressing   Problem: Nutrition: Goal: Adequate nutrition will be maintained Outcome: Progressing   Problem: Coping: Goal: Level of anxiety will decrease Outcome: Progressing   Problem: Elimination: Goal: Will not experience complications related to bowel motility Outcome: Progressing Goal: Will not experience complications related to urinary retention Outcome: Progressing   Problem: Pain Managment: Goal: General experience of comfort will improve and/or be controlled Outcome: Progressing   Problem:  Safety: Goal: Ability to remain free from injury will improve Outcome: Progressing   Problem: Skin Integrity: Goal: Risk for impaired skin integrity will decrease Outcome: Progressing   Problem: Clinical Measurements: Goal: Ability to avoid or minimize complications of infection will improve Outcome: Progressing   Problem: Skin Integrity: Goal: Skin integrity will improve Outcome: Progressing

## 2024-02-09 NOTE — Progress Notes (Signed)
 Patient ID: Christopher Roberson, male   DOB: 1975/09/10, 48 y.o.   MRN: [id number]   Pre Procedure note for inpatients:   Jayzon Roberson has been scheduled for Procedure(s) with comments: IRRIGATION AND DEBRIDEMENT ABSCESS (Right) - RIGHT THIGH DRESSING CHANGE , POSSIBLE DEBRIDEMENT today. The various methods of treatment have been discussed with the patient. After consideration of the risks, benefits and treatment options the patient has consented to the planned procedure.   The patient has been seen and labs reviewed. There are no changes in the patient's condition to prevent proceeding with the planned procedure today.  Recent labs:  Lab Results  Component Value Date   WBC 17.8 (H) 02/08/2024   HGB 10.9 (L) 02/08/2024   HCT 32.5 (L) 02/08/2024   PLT 460 (H) 02/08/2024   GLUCOSE 190 (H) 02/08/2024   CHOL 154 02/04/2024   TRIG 73 02/04/2024   HDL 67 02/04/2024   LDLCALC 73 02/04/2024   ALT 15 02/07/2024   AST 14 (L) 02/07/2024   NA 133 (L) 02/08/2024   K 3.8 02/08/2024   CL 99 02/08/2024   CREATININE 1.01 02/08/2024   BUN 13 02/08/2024   CO2 24 02/08/2024   INR 1.1 02/03/2024   HGBA1C 7.2 (H) 02/04/2024   MICROALBUR 15.0 06/26/2023    Vicenta Poli, MD 02/09/2024 6:46 AM

## 2024-02-10 ENCOUNTER — Encounter (HOSPITAL_COMMUNITY): Payer: Self-pay | Admitting: Surgery

## 2024-02-10 DIAGNOSIS — L0390 Cellulitis, unspecified: Secondary | ICD-10-CM | POA: Diagnosis not present

## 2024-02-10 DIAGNOSIS — I10 Essential (primary) hypertension: Secondary | ICD-10-CM | POA: Diagnosis not present

## 2024-02-10 DIAGNOSIS — R531 Weakness: Secondary | ICD-10-CM | POA: Diagnosis not present

## 2024-02-10 DIAGNOSIS — E876 Hypokalemia: Secondary | ICD-10-CM | POA: Diagnosis not present

## 2024-02-10 LAB — CBC WITH DIFFERENTIAL/PLATELET
Abs Immature Granulocytes: 0.61 K/uL — ABNORMAL HIGH (ref 0.00–0.07)
Basophils Absolute: 0.1 K/uL (ref 0.0–0.1)
Basophils Relative: 1 %
Eosinophils Absolute: 0.1 K/uL (ref 0.0–0.5)
Eosinophils Relative: 0 %
HCT: 34.2 % — ABNORMAL LOW (ref 39.0–52.0)
Hemoglobin: 11.4 g/dL — ABNORMAL LOW (ref 13.0–17.0)
Immature Granulocytes: 3 %
Lymphocytes Relative: 12 %
Lymphs Abs: 2.1 K/uL (ref 0.7–4.0)
MCH: 29.6 pg (ref 26.0–34.0)
MCHC: 33.3 g/dL (ref 30.0–36.0)
MCV: 88.8 fL (ref 80.0–100.0)
Monocytes Absolute: 1.7 K/uL — ABNORMAL HIGH (ref 0.1–1.0)
Monocytes Relative: 10 %
Neutro Abs: 13.2 K/uL — ABNORMAL HIGH (ref 1.7–7.7)
Neutrophils Relative %: 74 %
Platelets: 521 K/uL — ABNORMAL HIGH (ref 150–400)
RBC: 3.85 MIL/uL — ABNORMAL LOW (ref 4.22–5.81)
RDW: 11.9 % (ref 11.5–15.5)
WBC: 17.9 K/uL — ABNORMAL HIGH (ref 4.0–10.5)
nRBC: 0 % (ref 0.0–0.2)

## 2024-02-10 LAB — BASIC METABOLIC PANEL WITH GFR
Anion gap: 12 (ref 5–15)
BUN: 10 mg/dL (ref 6–20)
CO2: 25 mmol/L (ref 22–32)
Calcium: 8.8 mg/dL — ABNORMAL LOW (ref 8.9–10.3)
Chloride: 99 mmol/L (ref 98–111)
Creatinine, Ser: 0.76 mg/dL (ref 0.61–1.24)
GFR, Estimated: >60 mL/min (ref >60)
Glucose, Bld: 172 mg/dL — ABNORMAL HIGH (ref 70–99)
Potassium: 3.7 mmol/L (ref 3.5–5.1)
Sodium: 136 mmol/L (ref 135–145)

## 2024-02-10 LAB — GLUCOSE, CAPILLARY
Glucose-Capillary: 203 mg/dL — ABNORMAL HIGH (ref 70–99)
Glucose-Capillary: 104 mg/dL — ABNORMAL HIGH (ref 70–99)
Glucose-Capillary: 84 mg/dL (ref 70–99)
Glucose-Capillary: 150 mg/dL — ABNORMAL HIGH (ref 70–99)
Glucose-Capillary: 267 mg/dL — ABNORMAL HIGH (ref 70–99)

## 2024-02-10 MED ORDER — HYDROMORPHONE HCL 1 MG/ML IJ SOLN
1.0000 mg | INTRAMUSCULAR | Status: DC | PRN
  Administered 2024-02-10 – 2024-02-12 (×4): 2 mg via INTRAVENOUS
  Filled 2024-02-10: qty 2
  Filled 2024-02-10: qty 1
  Filled 2024-02-10 (×2): qty 2
  Filled 2024-02-10: qty 1

## 2024-02-10 MED ORDER — OXYCODONE HCL 5 MG PO TABS
5.0000 mg | ORAL_TABLET | ORAL | Status: AC | PRN
  Administered 2024-02-10 – 2024-02-12 (×6): 10 mg via ORAL
  Filled 2024-02-10 (×6): qty 2

## 2024-02-10 MED ORDER — ORAL CARE MOUTH RINSE: 15.0000 mL | OROMUCOSAL | Status: DC | PRN

## 2024-02-10 NOTE — Plan of Care (Signed)

## 2024-02-10 NOTE — Progress Notes (Signed)
 [facility] Progress Note  1 Day Post-Op  Subjective: CC:  Thigh pain 7/10.  Tolerating PO.  Fiance at bedside says she is a CNA/NT and will help with wound care.  Afebrile, WBC 17 from 16, hgb stable at 11  Objective: Vital signs in last 24 hours: Temp:  [97.5 F (36.4 C)-99 F (37.2 C)] 97.8 F (36.6 C) (10/18 0909) Pulse Rate:  [55-82] 58 (10/18 0909) Resp:  [13-24] 17 (10/18 0331) BP: (122-171)/(74-89) 122/80 (10/18 0909) SpO2:  [88 %-98 %] 98 % (10/18 0909) Last BM Date : 02/10/24  Intake/Output from previous day: 10/17 0701 - 10/18 0700 In: 928.7 [P.O.:240; I.V.:500; IV Piggyback:188.7] Out: 910 [Urine:900; Blood:10] Intake/Output this shift: No intake/output data recorded.  PE: Gen:  Alert, NAD, pleasant, fluent speech Card:  Regular rate and rhythm Pulm:  Normal effort Abd: Soft, non-tender, non-distended Skin: warm and dry, thigh wound with dressing c/d/I, mild surrounding erythema, interval improvement in induration Psych: A&Ox3   Lab Results:  Recent Labs    02/09/24 1222 02/10/24 0514  WBC 16.3* 17.9*  HGB 11.7* 11.4*  HCT 35.2* 34.2*  PLT 491* 521*   BMET Recent Labs    02/09/24 1222 02/10/24 0514  NA 135 136  K 4.5 3.7  CL 98 99  CO2 24 25  GLUCOSE 226* 172*  BUN 9 10  CREATININE 0.84 0.76  CALCIUM  8.7* 8.8*   PT/INR No results for input(s): LABPROT, INR in the last 72 hours.  CMP     Component Value Date/Time   NA 136 02/10/2024 0514   K 3.7 02/10/2024 0514   CL 99 02/10/2024 0514   CO2 25 02/10/2024 0514   GLUCOSE 172 (H) 02/10/2024 0514   BUN 10 02/10/2024 0514   CREATININE 0.76 02/10/2024 0514   CREATININE 0.78 06/26/2023 0859   CALCIUM  8.8 (L) 02/10/2024 0514   PROT 6.0 (L) 02/07/2024 0252   ALBUMIN 2.2 (L) 02/07/2024 0252   AST 14 (L) 02/07/2024 0252   ALT 15 02/07/2024 0252   ALKPHOS 71 02/07/2024 0252   BILITOT 0.8 02/07/2024 0252   GFRNONAA >60 02/10/2024 0514   GFRAA >60 10/10/2018 0330    Lipase     Component Value Date/Time   LIPASE 31 05/23/2015 2010       Studies/Results: No results found.   Anti-infectives: Anti-infectives (From admission, onward)    Start     Dose/Rate Route Frequency Ordered Stop   02/09/24 1300  Ampicillin-Sulbactam (UNASYN) 3 g in sodium chloride  0.9 % 100 mL IVPB        3 g 200 mL/hr over 30 Minutes Intravenous Every 6 hours 02/09/24 1203     02/06/24 1800  linezolid (ZYVOX) tablet 600 mg  Status:  Discontinued        600 mg Oral Every 12 hours 02/06/24 0926 02/09/24 1203   02/04/24 2200  cefTRIAXone (ROCEPHIN) 2 g in sodium chloride  0.9 % 100 mL IVPB  Status:  Discontinued        2 g 200 mL/hr over 30 Minutes Intravenous Every 24 hours 02/04/24 0334 02/09/24 1203   02/04/24 0600  vancomycin (VANCOREADY) IVPB 1250 mg/250 mL  Status:  Discontinued        1,250 mg 166.7 mL/hr over 90 Minutes Intravenous Every 12 hours 02/04/24 0352 02/06/24 0926   02/04/24 0030  vancomycin (VANCOCIN) IVPB 1000 mg/200 mL premix        1,000 mg 200 mL/hr over 60 Minutes Intravenous  Once 02/04/24 0017 02/04/24  0150   02/03/24 2245  cefTRIAXone (ROCEPHIN) 2 g in sodium chloride  0.9 % 100 mL IVPB        2 g 200 mL/hr over 30 Minutes Intravenous  Once 02/03/24 2230 02/03/24 2333        Assessment/Plan Right medial thigh abscess POD#4 S/p I&D 10/14 Dr. Vernetta  POD#1 s/p I&D 10/17 DB  - WBC and hgb overall stable - no fevers - cultures w/ GPC, GNR; FEW ACTINOMYCES NAESLUNDII, MODERATE BACTEROIDES SPECIES NOT FRAGILIS BETA LACTAMASE POSITIVE, folow - continue IV Abx - continue BID wet-to-dry dressing changes, continue education on wound care for home with fiance. Anticipate we will clear him for discharge as early as tomorrow if cellulitis continues to improve, fiance comfortable with wound care, and have recommended PO abx for home.    FEN: carb mod ID:  VTE: SCD's  AKI - improved today DM2 OSA HLD HTN Obesity PMH multiple CVA's with  residual right sided weakness.     LOS: 6 days   I reviewed nursing notes, hospitalist notes, last 24 h vitals and pain scores, last 48 h intake and output, last 24 h labs and trends, and last 24 h imaging results.  This care required moderate level of medical decision making.   Almarie Pringle, PA-C [facility] Please see Amion for pager number during day hours 7:00am-4:30pm

## 2024-02-10 NOTE — Plan of Care (Signed)
  Problem: Education: Goal: Ability to describe self-care measures that may prevent or decrease complications (Diabetes Survival Skills Education) will improve Outcome: Progressing   Problem: Fluid Volume: Goal: Ability to maintain a balanced intake and output will improve Outcome: Progressing   Problem: Nutritional: Goal: Maintenance of adequate nutrition will improve Outcome: Progressing   Problem: Education: Goal: Knowledge of disease or condition will improve Outcome: Progressing Goal: Knowledge of secondary prevention will improve (MUST DOCUMENT ALL) Outcome: Progressing Goal: Knowledge of patient specific risk factors will improve (DELETE if not current risk factor) Outcome: Progressing   Problem: Ischemic Stroke/TIA Tissue Perfusion: Goal: Complications of ischemic stroke/TIA will be minimized Outcome: Progressing   Problem: Metabolic: Goal: Ability to maintain appropriate glucose levels will improve Outcome: Not Progressing

## 2024-02-10 NOTE — Progress Notes (Signed)
 [facility]                                                                               Anes Rigel, is a 48 y.o. male, DOB - 01-13-1976, FMW:[id number] Admit date - 02/03/2024    Outpatient Primary MD for the patient is [facility] [employer]  LOS - 6  days    Brief summary   Christopher Roberson is a 48 y.o. 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  LKW 1930.  CT head showing no acute intracranial abnormality.  CTA head and neck negative for LVO. Seen by teleneurology and they are suspecting stroke recrudescence phenomenon versus recurrent ischemic stroke.  Recommend admission for stroke workup.    Assessment & Plan    Assessment and Plan: * Right sided weakness-resolved as of 02/06/2024 02/05/24 no new stroke on MRI brain. He has chronic right sided weakness due to old prior CVA. Therapy evaluations recommended no follow up.   Abscess of right thigh/ S/p I&D , on IV antibiotics. Cultures showing ACTINOMYCES NAESLUNDII and bactereoids sps General surgery on board.   Patient underwent repeat debridement, and drainage of the abscess of the right thigh abscess on 10/17.  Leukocytosis improving. Currently on Unasyn , possibly transition to Augmentin to complete the course.  Pain control with oxycodone and dilaudid.   Sepsis due to cellulitis (HCC) 02/05/24 present on admission. Cellulitis and abscess right thigh.    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   Obesity, Class III, BMI 40-49.9 (morbid obesity) (HCC) 02/05/24 Body mass index is 43.59 kg/m.    Essential hypertension Optimal BP parameters.    Type 2 diabetes mellitus with hyperglycemia, with long-term current use of insulin   (HCC) CBG (last 3)  Recent Labs    02/10/24 0332 02/10/24 0935 02/10/24 1241  GLUCAP 203* 104* 84   Continue with semglee  20 units daily, SSI.    Hypokalemia Replaced.     Hyponatremia Resolved.    AKI Resolved.    Mild normocytic anemia Monitor.  Hemoglobin stable around 11.    Thrombocytosis From infection.      Estimated body mass index is 44.89 kg/m as calculated from the following:   Height as of this encounter: 5' 7 (1.702 m).   Weight as of this encounter: 130 kg.  Code Status: FULL CODE.  DVT Prophylaxis:  heparin injection 5,000 Units Start: 02/06/24 2200 SCD's Start: 02/04/24 0319   Level of Care: Level of care: Progressive Family Communication: none at bedside.   Disposition Plan:     Remains inpatient appropriate:  pending.   Procedures:  S/p I&D 10/14 and 10/17 Dr. Vernetta   Consultants:   Surgery.   Antimicrobials:   Anti-infectives (From admission, onward)    Start     Dose/Rate Route Frequency Ordered Stop   02/09/24 1300  Ampicillin-Sulbactam (UNASYN) 3 g in sodium chloride  0.9 % 100 mL IVPB        3 g 200  mL/hr over 30 Minutes Intravenous Every 6 hours 02/09/24 1203     02/06/24 1800  linezolid (ZYVOX) tablet 600 mg  Status:  Discontinued        600 mg Oral Every 12 hours 02/06/24 0926 02/09/24 1203   02/04/24 2200  cefTRIAXone (ROCEPHIN) 2 g in sodium chloride  0.9 % 100 mL IVPB  Status:  Discontinued        2 g 200 mL/hr over 30 Minutes Intravenous Every 24 hours 02/04/24 0334 02/09/24 1203   02/04/24 0600  vancomycin (VANCOREADY) IVPB 1250 mg/250 mL  Status:  Discontinued        1,250 mg 166.7 mL/hr over 90 Minutes Intravenous Every 12 hours 02/04/24 0352 02/06/24 0926   02/04/24 0030  vancomycin (VANCOCIN) IVPB 1000 mg/200 mL premix        1,000 mg 200 mL/hr over 60 Minutes Intravenous  Once 02/04/24 0017 02/04/24 0150   02/03/24 2245  cefTRIAXone (ROCEPHIN) 2 g in sodium chloride  0.9 % 100 mL IVPB        2 g 200  mL/hr over 30 Minutes Intravenous  Once 02/03/24 2230 02/03/24 2333        Medications  Scheduled Meds:  acetaminophen   1,000 mg Oral QID   atorvastatin   80 mg Oral QPM   ezetimibe  10 mg Oral QPM   heparin injection (subcutaneous)  5,000 Units Subcutaneous Q8H   hydrALAZINE   50 mg Oral BID   insulin  aspart  0-15 Units Subcutaneous Q4H   insulin  glargine-yfgn  20 Units Subcutaneous Daily   NIFEdipine  90 mg Oral Daily   pantoprazole   40 mg Oral Daily   sertraline   100 mg Oral Daily   traZODone   50 mg Oral QHS   Continuous Infusions:  ampicillin-sulbactam (UNASYN) IV 3 g (02/10/24 1319)   PRN Meds:.diphenhydrAMINE , HYDROmorphone (DILAUDID) injection, mouth rinse, oxyCODONE    Subjective:   Christopher Roberson was seen and examined today.   No new complaints.  Objective:   Vitals:   02/09/24 2202 02/10/24 0331 02/10/24 0909 02/10/24 1321  BP:  137/82 122/80 132/83  Pulse: (!) 56 71 (!) 58 72  Resp: 17 17 17    Temp:  98 F (36.7 C) 97.8 F (36.6 C) 98.2 F (36.8 C)  TempSrc:   Oral Oral  SpO2: 94% 97% 98% 100%  Weight:      Height:        Intake/Output Summary (Last 24 hours) at 02/10/2024 1539 Last data filed at 02/10/2024 1000 Gross per 24 hour  Intake 428.72 ml  Output 1400 ml  Net -971.28 ml   Filed Weights   02/04/24 2350 02/06/24 1311  Weight: 130 kg 130 kg     Exam General exam: Appears calm and comfortable  Respiratory system: Clear to auscultation. Respiratory effort normal. Cardiovascular system: S1 & S2 heard, RRR. No JVD,  Gastrointestinal system: Abdomen is nondistended, soft and nontender.  Central nervous system: Alert and oriented Extremities: right thigh wound bandaged.  Skin: No rashes,  Psychiatry: Mood & affect appropriate.       Data Reviewed:  I have personally reviewed following labs and imaging studies   CBC Lab Results  Component Value Date   WBC 17.9 (H) 02/10/2024   RBC 3.85 (L) 02/10/2024   HGB 11.4 (L) 02/10/2024    HCT 34.2 (L) 02/10/2024   MCV 88.8 02/10/2024   MCH 29.6 02/10/2024   PLT 521 (H) 02/10/2024   MCHC 33.3 02/10/2024   RDW 11.9 02/10/2024  LYMPHSABS 2.1 02/10/2024   MONOABS 1.7 (H) 02/10/2024   EOSABS 0.1 02/10/2024   BASOSABS 0.1 02/10/2024     Last metabolic panel Lab Results  Component Value Date   NA 136 02/10/2024   K 3.7 02/10/2024   CL 99 02/10/2024   CO2 25 02/10/2024   BUN 10 02/10/2024   CREATININE 0.76 02/10/2024   GLUCOSE 172 (H) 02/10/2024   GFRNONAA >60 02/10/2024   GFRAA >60 10/10/2018   CALCIUM  8.8 (L) 02/10/2024   PROT 6.0 (L) 02/07/2024   ALBUMIN 2.2 (L) 02/07/2024   BILITOT 0.8 02/07/2024   ALKPHOS 71 02/07/2024   AST 14 (L) 02/07/2024   ALT 15 02/07/2024   ANIONGAP 12 02/10/2024    CBG (last 3)  Recent Labs    02/10/24 0332 02/10/24 0935 02/10/24 1241  GLUCAP 203* 104* 84      Coagulation Profile: Recent Labs  Lab 02/03/24 2155  INR 1.1     Radiology Studies: No results found.     Elgie Butter M.D. [facility] 02/10/2024, 3:39 PM  Available via Epic secure chat 7am-7pm After 7 pm, please refer to night coverage provider listed on amion.

## 2024-02-11 DIAGNOSIS — L0390 Cellulitis, unspecified: Secondary | ICD-10-CM | POA: Diagnosis not present

## 2024-02-11 DIAGNOSIS — R531 Weakness: Secondary | ICD-10-CM | POA: Diagnosis not present

## 2024-02-11 DIAGNOSIS — E876 Hypokalemia: Secondary | ICD-10-CM | POA: Diagnosis not present

## 2024-02-11 DIAGNOSIS — I10 Essential (primary) hypertension: Secondary | ICD-10-CM | POA: Diagnosis not present

## 2024-02-11 LAB — GLUCOSE, CAPILLARY
Glucose-Capillary: 136 mg/dL — ABNORMAL HIGH (ref 70–99)
Glucose-Capillary: 155 mg/dL — ABNORMAL HIGH (ref 70–99)
Glucose-Capillary: 155 mg/dL — ABNORMAL HIGH (ref 70–99)
Glucose-Capillary: 162 mg/dL — ABNORMAL HIGH (ref 70–99)
Glucose-Capillary: 188 mg/dL — ABNORMAL HIGH (ref 70–99)
Glucose-Capillary: 194 mg/dL — ABNORMAL HIGH (ref 70–99)
Glucose-Capillary: 265 mg/dL — ABNORMAL HIGH (ref 70–99)

## 2024-02-11 MED ORDER — DIPHENHYDRAMINE HCL 25 MG PO CAPS
50.0000 mg | ORAL_CAPSULE | Freq: Four times a day (QID) | ORAL | Status: AC | PRN
  Administered 2024-02-11 (×2): 50 mg via ORAL
  Filled 2024-02-11 (×2): qty 2

## 2024-02-11 NOTE — Progress Notes (Signed)
 [facility] Progress Note  2 Days Post-Op  Subjective: CC:  Cc throbbing thigh pain. Having BMs and tolerating PO.  Fiance at bedside expresses concern about being able to adequately pack wound at home.  Objective: Vital signs in last 24 hours: Temp:  [98.1 F (36.7 C)-98.4 F (36.9 C)] 98.1 F (36.7 C) (10/19 0749) Pulse Rate:  [59-72] 59 (10/19 0749) Resp:  [19] 19 (10/19 0749) BP: (132-153)/(76-84) 152/84 (10/19 0749) SpO2:  [94 %-100 %] 94 % (10/19 0749) Last BM Date : 02/10/24  Intake/Output from previous day: 10/18 0701 - 10/19 0700 In: -  Out: 500 [Urine:500] Intake/Output this shift: No intake/output data recorded.  PE: Gen:  Alert, NAD, pleasant, fluent speech Card:  Regular rate and rhythm Pulm:  Normal effort Abd: Soft, non-tender, non-distended Skin: warm and dry, thigh wound with mild surrounding erythema/cellulitis, improved compared to prior. Dressing changed by me - wound base appears healthy without necrosis some sanguinous oozing was present but controlled with pressure. Wound tunnels posteriorly and cephalad.  Psych: A&Ox3   Lab Results:  Recent Labs    02/09/24 1222 02/10/24 0514  WBC 16.3* 17.9*  HGB 11.7* 11.4*  HCT 35.2* 34.2*  PLT 491* 521*   BMET Recent Labs    02/09/24 1222 02/10/24 0514  NA 135 136  K 4.5 3.7  CL 98 99  CO2 24 25  GLUCOSE 226* 172*  BUN 9 10  CREATININE 0.84 0.76  CALCIUM  8.7* 8.8*   PT/INR No results for input(s): LABPROT, INR in the last 72 hours.  CMP     Component Value Date/Time   NA 136 02/10/2024 0514   K 3.7 02/10/2024 0514   CL 99 02/10/2024 0514   CO2 25 02/10/2024 0514   GLUCOSE 172 (H) 02/10/2024 0514   BUN 10 02/10/2024 0514   CREATININE 0.76 02/10/2024 0514   CREATININE 0.78 06/26/2023 0859   CALCIUM  8.8 (L) 02/10/2024 0514   PROT 6.0 (L) 02/07/2024 0252   ALBUMIN 2.2 (L) 02/07/2024 0252   AST 14 (L) 02/07/2024 0252   ALT 15 02/07/2024 0252   ALKPHOS 71 02/07/2024  0252   BILITOT 0.8 02/07/2024 0252   GFRNONAA >60 02/10/2024 0514   GFRAA >60 10/10/2018 0330   Lipase     Component Value Date/Time   LIPASE 31 05/23/2015 2010       Studies/Results: No results found.   Anti-infectives: Anti-infectives (From admission, onward)    Start     Dose/Rate Route Frequency Ordered Stop   02/09/24 1300  Ampicillin-Sulbactam (UNASYN) 3 g in sodium chloride  0.9 % 100 mL IVPB        3 g 200 mL/hr over 30 Minutes Intravenous Every 6 hours 02/09/24 1203     02/06/24 1800  linezolid (ZYVOX) tablet 600 mg  Status:  Discontinued        600 mg Oral Every 12 hours 02/06/24 0926 02/09/24 1203   02/04/24 2200  cefTRIAXone (ROCEPHIN) 2 g in sodium chloride  0.9 % 100 mL IVPB  Status:  Discontinued        2 g 200 mL/hr over 30 Minutes Intravenous Every 24 hours 02/04/24 0334 02/09/24 1203   02/04/24 0600  vancomycin (VANCOREADY) IVPB 1250 mg/250 mL  Status:  Discontinued        1,250 mg 166.7 mL/hr over 90 Minutes Intravenous Every 12 hours 02/04/24 0352 02/06/24 0926   02/04/24 0030  vancomycin (VANCOCIN) IVPB 1000 mg/200 mL premix  1,000 mg 200 mL/hr over 60 Minutes Intravenous  Once 02/04/24 0017 02/04/24 0150   02/03/24 2245  cefTRIAXone (ROCEPHIN) 2 g in sodium chloride  0.9 % 100 mL IVPB        2 g 200 mL/hr over 30 Minutes Intravenous  Once 02/03/24 2230 02/03/24 2333        Assessment/Plan Right medial thigh abscess POD#5 S/p I&D 10/14 Dr. Vernetta  POD#2 s/p I&D 10/17 DB  - continue IV abx today for cellulitis, check CBC in AM, if stable to improved then I think he will be cleared for discharge from [facility] standpoint. Barrier to discharge is wound care - if patient/fiance are unable to care for wound then he will need SNF. He tells me that he has a removable shower head at home.  - continue BID wet-to-dry dressing changes, continue education on wound care for home with fiance.    FEN: carb mod ID: - UNasyn' cultures w/ GPC, GNR; FEW  ACTINOMYCES NAESLUNDII, MODERATE BACTEROIDES SPECIES NOT FRAGILIS BETA LACTAMASE POSITIVE, folow VTE: SCD's  AKI - improved today DM2 OSA HLD HTN Obesity PMH multiple CVA's with residual right sided weakness.     LOS: 7 days   I reviewed nursing notes, hospitalist notes, last 24 h vitals and pain scores, last 48 h intake and output, last 24 h labs and trends, and last 24 h imaging results.  This care required moderate level of medical decision making.   Almarie Pringle, PA-C [facility] Please see Amion for pager number during day hours 7:00am-4:30pm

## 2024-02-11 NOTE — Plan of Care (Signed)
  Problem: Nutritional: Goal: Maintenance of adequate nutrition will improve Outcome: Progressing   Problem: Education: Goal: Knowledge of disease or condition will improve Outcome: Progressing Goal: Knowledge of secondary prevention will improve (MUST DOCUMENT ALL) Outcome: Progressing Goal: Knowledge of patient specific risk factors will improve (DELETE if not current risk factor) Outcome: Progressing   Problem: Ischemic Stroke/TIA Tissue Perfusion: Goal: Complications of ischemic stroke/TIA will be minimized Outcome: Progressing   Problem: Health Behavior/Discharge Planning: Goal: Ability to manage health-related needs will improve Outcome: Progressing   Problem: Education: Goal: Knowledge of disease or condition will improve Outcome: Progressing Goal: Knowledge of secondary prevention will improve (MUST DOCUMENT ALL) Outcome: Progressing Goal: Knowledge of patient specific risk factors will improve (DELETE if not current risk factor) Outcome: Progressing   Problem: Ischemic Stroke/TIA Tissue Perfusion: Goal: Complications of ischemic stroke/TIA will be minimized Outcome: Progressing   Problem: Metabolic: Goal: Ability to maintain appropriate glucose levels will improve Outcome: Not Progressing

## 2024-02-11 NOTE — Plan of Care (Signed)
 Problem: Education: Goal: Ability to describe self-care measures that may prevent or decrease complications (Diabetes Survival Skills Education) will improve Outcome: Progressing Goal: Individualized Educational Video(s) Outcome: Progressing   Problem: Coping: Goal: Ability to adjust to condition or change in health will improve Outcome: Progressing   Problem: Fluid Volume: Goal: Ability to maintain a balanced intake and output will improve Outcome: Progressing   Problem: Health Behavior/Discharge Planning: Goal: Ability to identify and utilize available resources and services will improve Outcome: Progressing Goal: Ability to manage health-related needs will improve Outcome: Progressing   Problem: Metabolic: Goal: Ability to maintain appropriate glucose levels will improve Outcome: Progressing   Problem: Nutritional: Goal: Maintenance of adequate nutrition will improve Outcome: Progressing Goal: Progress toward achieving an optimal weight will improve Outcome: Progressing   Problem: Skin Integrity: Goal: Risk for impaired skin integrity will decrease Outcome: Progressing   Problem: Tissue Perfusion: Goal: Adequacy of tissue perfusion will improve Outcome: Progressing   Problem: Education: Goal: Knowledge of disease or condition will improve Outcome: Progressing Goal: Knowledge of secondary prevention will improve (MUST DOCUMENT ALL) Outcome: Progressing Goal: Knowledge of patient specific risk factors will improve (DELETE if not current risk factor) Outcome: Progressing   Problem: Ischemic Stroke/TIA Tissue Perfusion: Goal: Complications of ischemic stroke/TIA will be minimized Outcome: Progressing   Problem: Coping: Goal: Will verbalize positive feelings about self Outcome: Progressing Goal: Will identify appropriate support needs Outcome: Progressing   Problem: Health Behavior/Discharge Planning: Goal: Ability to manage health-related needs will  improve Outcome: Progressing Goal: Goals will be collaboratively established with patient/family Outcome: Progressing   Problem: Self-Care: Goal: Ability to participate in self-care as condition permits will improve Outcome: Progressing Goal: Verbalization of feelings and concerns over difficulty with self-care will improve Outcome: Progressing Goal: Ability to communicate needs accurately will improve Outcome: Progressing   Problem: Nutrition: Goal: Risk of aspiration will decrease Outcome: Progressing Goal: Dietary intake will improve Outcome: Progressing   Problem: Education: Goal: Knowledge of General Education information will improve Description: Including pain rating scale, medication(s)/side effects and non-pharmacologic comfort measures Outcome: Progressing   Problem: Health Behavior/Discharge Planning: Goal: Ability to manage health-related needs will improve Outcome: Progressing   Problem: Clinical Measurements: Goal: Ability to maintain clinical measurements within normal limits will improve Outcome: Progressing Goal: Will remain free from infection Outcome: Progressing Goal: Diagnostic test results will improve Outcome: Progressing Goal: Respiratory complications will improve Outcome: Progressing Goal: Cardiovascular complication will be avoided Outcome: Progressing   Problem: Activity: Goal: Risk for activity intolerance will decrease Outcome: Progressing   Problem: Nutrition: Goal: Adequate nutrition will be maintained Outcome: Progressing   Problem: Coping: Goal: Level of anxiety will decrease Outcome: Progressing   Problem: Elimination: Goal: Will not experience complications related to bowel motility Outcome: Progressing Goal: Will not experience complications related to urinary retention Outcome: Progressing   Problem: Education: Goal: Knowledge of disease or condition will improve Outcome: Progressing Goal: Knowledge of secondary  prevention will improve (MUST DOCUMENT ALL) Outcome: Progressing Goal: Knowledge of patient specific risk factors will improve (DELETE if not current risk factor) Outcome: Progressing   Problem: Ischemic Stroke/TIA Tissue Perfusion: Goal: Complications of ischemic stroke/TIA will be minimized Outcome: Progressing   Problem: Coping: Goal: Will verbalize positive feelings about self Outcome: Progressing Goal: Will identify appropriate support needs Outcome: Progressing   Problem: Health Behavior/Discharge Planning: Goal: Ability to manage health-related needs will improve Outcome: Progressing Goal: Goals will be collaboratively established with patient/family Outcome: Progressing   Problem: Self-Care: Goal: Ability to participate in  self-care as condition permits will improve Outcome: Progressing Goal: Verbalization of feelings and concerns over difficulty with self-care will improve Outcome: Progressing Goal: Ability to communicate needs accurately will improve Outcome: Progressing   Problem: Nutrition: Goal: Risk of aspiration will decrease Outcome: Progressing Goal: Dietary intake will improve Outcome: Progressing

## 2024-02-11 NOTE — Progress Notes (Signed)
 [facility]                                                                               Jacquese Hackman, is a 48 y.o. male, DOB - 04-29-1975, FMW:[id number] Admit date - 02/03/2024    Outpatient Primary MD for the patient is [facility] [employer]  LOS - 7  days    Brief summary   Pheonix Clinkscale is a 48 y.o. 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  LKW 1930.  CT head showing no acute intracranial abnormality.  CTA head and neck negative for LVO. Seen by teleneurology and they are suspecting stroke recrudescence phenomenon versus recurrent ischemic stroke.  Recommend admission for stroke workup.    Assessment & Plan    Assessment and Plan: * Right sided weakness-resolved as of 02/06/2024 02/05/24 no new stroke on MRI brain. He has chronic right sided weakness due to old prior CVA. Therapy evaluations recommended no follow up.   Abscess of right thigh/ S/p I&D , on IV antibiotics. Cultures showing ACTINOMYCES NAESLUNDII and bactereoids sps General surgery on board.   Patient underwent repeat debridement, and drainage of the abscess of the right thigh abscess on 10/17.  Leukocytosis improving. Currently on Unasyn , possibly transition to Augmentin to complete the course.  Pain control with oxycodone and dilaudid.  Waiting for his fiance to see the dressing changes to see if she can do it at home.  She is requesting [facility] RN /k wound care at home for atleast a couple of visits.  Will place TOC for HH.   Sepsis due to cellulitis (HCC) 02/05/24 present on admission. Cellulitis and abscess right thigh.    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   Obesity, Class III, BMI 40-49.9 (morbid obesity)  (HCC) 02/05/24 Body mass index is 43.59 kg/m.    Essential hypertension Well controlled.    Type 2 diabetes mellitus with hyperglycemia, with long-term current use of insulin  (HCC) CBG (last 3)  Recent Labs    02/11/24 0749 02/11/24 1240 02/11/24 1605  GLUCAP 188* 265* 194*   Continue with semglee  20 units daily, SSI. Add novolog  2 units TIDAC.    Hypokalemia Replaced.     Hyponatremia Resolved.    AKI Resolved.    Mild normocytic anemia Monitor.  Hemoglobin stable around 11.    Thrombocytosis From infection.      Estimated body mass index is 44.89 kg/m as calculated from the following:   Height as of this encounter: 5' 7 (1.702 m).   Weight as of this encounter: 130 kg.  Code Status: FULL CODE.  DVT Prophylaxis:  heparin injection 5,000 Units Start: 02/06/24 2200 SCD's Start: 02/04/24 0319   Level of Care: Level of care: Med-Surg Family Communication: none at bedside.   Disposition Plan:     Remains inpatient appropriate:  pending. Possible d/c in am.   Procedures:  S/p I&D 10/14 and 10/17 Dr. Vernetta   Consultants:  Surgery.   Antimicrobials:   Anti-infectives (From admission, onward)    Start     Dose/Rate Route Frequency Ordered Stop   02/09/24 1300  Ampicillin-Sulbactam (UNASYN) 3 g in sodium chloride  0.9 % 100 mL IVPB        3 g 200 mL/hr over 30 Minutes Intravenous Every 6 hours 02/09/24 1203     02/06/24 1800  linezolid (ZYVOX) tablet 600 mg  Status:  Discontinued        600 mg Oral Every 12 hours 02/06/24 0926 02/09/24 1203   02/04/24 2200  cefTRIAXone (ROCEPHIN) 2 g in sodium chloride  0.9 % 100 mL IVPB  Status:  Discontinued        2 g 200 mL/hr over 30 Minutes Intravenous Every 24 hours 02/04/24 0334 02/09/24 1203   02/04/24 0600  vancomycin (VANCOREADY) IVPB 1250 mg/250 mL  Status:  Discontinued        1,250 mg 166.7 mL/hr over 90 Minutes Intravenous Every 12 hours 02/04/24 0352 02/06/24 0926   02/04/24 0030  vancomycin  (VANCOCIN) IVPB 1000 mg/200 mL premix        1,000 mg 200 mL/hr over 60 Minutes Intravenous  Once 02/04/24 0017 02/04/24 0150   02/03/24 2245  cefTRIAXone (ROCEPHIN) 2 g in sodium chloride  0.9 % 100 mL IVPB        2 g 200 mL/hr over 30 Minutes Intravenous  Once 02/03/24 2230 02/03/24 2333        Medications  Scheduled Meds:  acetaminophen   1,000 mg Oral QID   atorvastatin   80 mg Oral QPM   ezetimibe  10 mg Oral QPM   heparin injection (subcutaneous)  5,000 Units Subcutaneous Q8H   hydrALAZINE   50 mg Oral BID   insulin  aspart  0-15 Units Subcutaneous Q4H   insulin  glargine-yfgn  20 Units Subcutaneous Daily   NIFEdipine  90 mg Oral Daily   pantoprazole   40 mg Oral Daily   sertraline   100 mg Oral Daily   traZODone   50 mg Oral QHS   Continuous Infusions:  ampicillin-sulbactam (UNASYN) IV 3 g (02/11/24 1250)   PRN Meds:.diphenhydrAMINE , HYDROmorphone (DILAUDID) injection, mouth rinse, oxyCODONE    Subjective:   Dylin Breeden was seen and examined today.   No chest pain or sob. Still pain not well controlled.   Objective:   Vitals:   02/11/24 0457 02/11/24 0749 02/11/24 1240 02/11/24 1537  BP: (!) 153/76 (!) 152/84 (!) 141/64 (!) 151/68  Pulse: 61 (!) 59 68 68  Resp:  19  16  Temp: 98.4 F (36.9 C) 98.1 F (36.7 C) 98.9 F (37.2 C) 98 F (36.7 C)  TempSrc: Oral Oral Oral Oral  SpO2: 95% 94% (!) 89% 95%  Weight:      Height:       No intake or output data in the 24 hours ending 02/11/24 1656  Filed Weights   02/04/24 2350 02/06/24 1311  Weight: 130 kg 130 kg     Exam General exam: Appears calm and comfortable  Respiratory system: Clear to auscultation. Respiratory effort normal. Cardiovascular system: S1 & S2 heard, RRR. No JVD,  Gastrointestinal system: Abdomen is nondistended, soft and nontender.  Central nervous system: Alert and oriented. . Extremities: right thigh wound.  Skin: No rashes,  Psychiatry:  Mood & affect appropriate.        Data  Reviewed:  I have personally reviewed following labs and imaging studies   CBC Lab Results  Component Value Date   WBC 17.9 (H)  02/10/2024   RBC 3.85 (L) 02/10/2024   HGB 11.4 (L) 02/10/2024   HCT 34.2 (L) 02/10/2024   MCV 88.8 02/10/2024   MCH 29.6 02/10/2024   PLT 521 (H) 02/10/2024   MCHC 33.3 02/10/2024   RDW 11.9 02/10/2024   LYMPHSABS 2.1 02/10/2024   MONOABS 1.7 (H) 02/10/2024   EOSABS 0.1 02/10/2024   BASOSABS 0.1 02/10/2024     Last metabolic panel Lab Results  Component Value Date   NA 136 02/10/2024   K 3.7 02/10/2024   CL 99 02/10/2024   CO2 25 02/10/2024   BUN 10 02/10/2024   CREATININE 0.76 02/10/2024   GLUCOSE 172 (H) 02/10/2024   GFRNONAA >60 02/10/2024   GFRAA >60 10/10/2018   CALCIUM  8.8 (L) 02/10/2024   PROT 6.0 (L) 02/07/2024   ALBUMIN 2.2 (L) 02/07/2024   BILITOT 0.8 02/07/2024   ALKPHOS 71 02/07/2024   AST 14 (L) 02/07/2024   ALT 15 02/07/2024   ANIONGAP 12 02/10/2024    CBG (last 3)  Recent Labs    02/11/24 0749 02/11/24 1240 02/11/24 1605  GLUCAP 188* 265* 194*      Coagulation Profile: No results for input(s): INR, PROTIME in the last 168 hours.    Radiology Studies: No results found.     Elgie Butter M.D. [facility] 02/11/2024, 4:56 PM  Available via Epic secure chat 7am-7pm After 7 pm, please refer to night coverage provider listed on amion.

## 2024-02-11 NOTE — Progress Notes (Signed)
   02/11/24 0000  BiPAP/CPAP/SIPAP  $ Non-Invasive Home Ventilator  Subsequent  BiPAP/CPAP/SIPAP Pt Type Adult  BiPAP/CPAP/SIPAP Resmed  Reason BIPAP/CPAP not in use Non-compliant (pt refused)

## 2024-02-12 ENCOUNTER — Other Ambulatory Visit (HOSPITAL_COMMUNITY): Payer: Self-pay

## 2024-02-12 DIAGNOSIS — E876 Hypokalemia: Secondary | ICD-10-CM | POA: Diagnosis not present

## 2024-02-12 DIAGNOSIS — I10 Essential (primary) hypertension: Secondary | ICD-10-CM | POA: Diagnosis not present

## 2024-02-12 DIAGNOSIS — I69351 Hemiplegia and hemiparesis following cerebral infarction affecting right dominant side: Secondary | ICD-10-CM | POA: Diagnosis not present

## 2024-02-12 DIAGNOSIS — R531 Weakness: Secondary | ICD-10-CM | POA: Diagnosis not present

## 2024-02-12 LAB — CBC
HCT: 32.3 % — ABNORMAL LOW (ref 39.0–52.0)
Hemoglobin: 10.4 g/dL — ABNORMAL LOW (ref 13.0–17.0)
MCH: 29.2 pg (ref 26.0–34.0)
MCHC: 32.2 g/dL (ref 30.0–36.0)
MCV: 90.7 fL (ref 80.0–100.0)
Platelets: 444 K/uL — ABNORMAL HIGH (ref 150–400)
RBC: 3.56 MIL/uL — ABNORMAL LOW (ref 4.22–5.81)
RDW: 12.1 % (ref 11.5–15.5)
WBC: 12.5 K/uL — ABNORMAL HIGH (ref 4.0–10.5)
nRBC: 0 % (ref 0.0–0.2)

## 2024-02-12 LAB — GLUCOSE, CAPILLARY
Glucose-Capillary: 133 mg/dL — ABNORMAL HIGH (ref 70–99)
Glucose-Capillary: 134 mg/dL — ABNORMAL HIGH (ref 70–99)

## 2024-02-12 MED ORDER — HUMALOG KWIKPEN 100 UNIT/ML ~~LOC~~ SOPN
0.0000 [IU] | PEN_INJECTOR | Freq: Three times a day (TID) | SUBCUTANEOUS | 11 refills | Status: AC
  Filled 2024-02-12: qty 15, 34d supply, fill #0

## 2024-02-12 MED ORDER — OXYCODONE HCL 5 MG PO TABS
5.0000 mg | ORAL_TABLET | Freq: Four times a day (QID) | ORAL | 0 refills | Status: AC | PRN
  Filled 2024-02-12: qty 20, 5d supply, fill #0

## 2024-02-12 MED ORDER — AMOXICILLIN-POT CLAVULANATE 875-125 MG PO TABS
1.0000 | ORAL_TABLET | Freq: Two times a day (BID) | ORAL | 0 refills | Status: DC
  Filled 2024-02-12: qty 20, 10d supply, fill #0
  Filled 2024-02-19: qty 10, 5d supply, fill #0

## 2024-02-12 MED ORDER — HUMALOG KWIKPEN 100 UNIT/ML ~~LOC~~ SOPN: 0.0000 [IU] | PEN_INJECTOR | Freq: Three times a day (TID) | SUBCUTANEOUS | 11 refills | Status: DC

## 2024-02-12 MED ORDER — INSULIN LISPRO (1 UNIT DIAL) 100 UNIT/ML (KWIKPEN): PEN_INJECTOR | SUBCUTANEOUS | 11 refills | Status: DC

## 2024-02-12 MED ORDER — AMOXICILLIN-POT CLAVULANATE 875-125 MG PO TABS
1.0000 | ORAL_TABLET | Freq: Two times a day (BID) | ORAL | 0 refills | Status: DC
  Filled 2024-02-12: qty 10, 5d supply, fill #0

## 2024-02-12 MED ORDER — OXYCODONE HCL 5 MG PO TABS: 5.0000 mg | ORAL_TABLET | Freq: Four times a day (QID) | ORAL | 0 refills | Status: DC | PRN

## 2024-02-12 MED ORDER — INSULIN PEN NEEDLE 32G X 4 MM MISC
1.0000 | Freq: Three times a day (TID) | 0 refills | Status: DC
  Filled 2024-02-12: qty 100, 30d supply, fill #0

## 2024-02-12 MED ORDER — ACETAMINOPHEN 500 MG PO TABS: 1000.0000 mg | ORAL_TABLET | Freq: Four times a day (QID) | ORAL | Status: AC | PRN

## 2024-02-12 NOTE — Plan of Care (Signed)
  Problem: Education: Goal: Ability to describe self-care measures that may prevent or decrease complications (Diabetes Survival Skills Education) will improve Outcome: Progressing   Problem: Self-Care: Goal: Ability to participate in self-care as condition permits will improve Outcome: Progressing   Problem: Education: Goal: Knowledge of disease or condition will improve Outcome: Progressing Goal: Knowledge of secondary prevention will improve (MUST DOCUMENT ALL) Outcome: Progressing Goal: Knowledge of patient specific risk factors will improve (DELETE if not current risk factor) Outcome: Progressing   Problem: Ischemic Stroke/TIA Tissue Perfusion: Goal: Complications of ischemic stroke/TIA will be minimized Outcome: Progressing

## 2024-02-12 NOTE — TOC Transition Note (Signed)
 Transition of Care [facility]) - Discharge Note   Patient Details  Name: Christopher Roberson MRN: [id number] Date of Birth: 1975/08/10  [facility]) CM/SW Contact:  Andrez JULIANNA George, RN Phone Number: 02/12/2024, 12:29 PM   Clinical Narrative:     Pt is discharging home with home health RN for wound care through [facility]. [facility]da will contact him for the first home visit. Pts SO has been educated on wound care. Bedside RN to send supplies for wound care until [facility] is able to see him. Pt has transportation home.  Final next level of care: Home w Home Health Services Barriers to Discharge: No Barriers Identified   Patient Goals and CMS Choice   CMS Medicare.gov Compare Post Acute Care list provided to:: Patient Choice offered to / list presented to : Patient      Discharge Placement                       Discharge Plan and Services Additional resources added to the After Visit Summary for     Discharge Planning Services: CM Consult                      HH Arranged: RN [facility] Agency: [facility] Date [facility] Agency Contacted: 02/12/24   Representative spoke with at [facility] Agency: Darleene  Social Drivers of Health (SDOH) Interventions SDOH Screenings   Food Insecurity: No Food Insecurity (02/05/2024)  Housing: Low Risk  (02/05/2024)  Transportation Needs: No Transportation Needs (02/05/2024)  Utilities: Not At Risk (02/05/2024)  Depression (PHQ2-9): Low Risk  (05/31/2021)  Financial Resource Strain: Low Risk  (05/20/2021)  Recent Concern: Financial Resource Strain - Medium Risk (03/31/2021)   Received from [facility] visits prior to 06/25/2022.  Physical Activity: Insufficiently Active (03/31/2021)   Received from [facility] visits prior to 06/25/2022.  Social Connections: Moderately Isolated (02/05/2024)  Stress: No Stress Concern Present (05/31/2021)  Recent Concern: Stress - Stress Concern Present (03/31/2021)   Received from  [facility] visits prior to 06/25/2022.  Tobacco Use: Low Risk  (02/09/2024)     Readmission Risk Interventions     No data to display

## 2024-02-12 NOTE — Progress Notes (Signed)
 [facility] Progress Note  3 Days Post-Op  Subjective: CC:  Overall doing better, fiance able to pack wound yesterday afternoon    Objective: Vital signs in last 24 hours: Temp:  [98 F (36.7 C)-99 F (37.2 C)] 98.3 F (36.8 C) (10/20 0803) Pulse Rate:  [54-68] 54 (10/20 0803) Resp:  [16-18] 18 (10/20 0519) BP: (141-151)/(64-79) 148/77 (10/20 0803) SpO2:  [89 %-97 %] 97 % (10/20 0803) Last BM Date : 02/10/24  Intake/Output from previous day: 10/19 0701 - 10/20 0700 In: -  Out: 450 [Urine:450] Intake/Output this shift: No intake/output data recorded.  PE: Gen:  Alert, NAD, pleasant, fluent speech Card:  Regular rate and rhythm Pulm:  Normal effort Abd: Soft, non-tender, non-distended Skin: warm and dry, induration of the thigh improving, nearly resolved Psych: A&Ox3   Lab Results:  Recent Labs    02/10/24 0514 02/12/24 0553  WBC 17.9* 12.5*  HGB 11.4* 10.4*  HCT 34.2* 32.3*  PLT 521* 444*   BMET Recent Labs    02/09/24 1222 02/10/24 0514  NA 135 136  K 4.5 3.7  CL 98 99  CO2 24 25  GLUCOSE 226* 172*  BUN 9 10  CREATININE 0.84 0.76  CALCIUM  8.7* 8.8*   PT/INR No results for input(s): LABPROT, INR in the last 72 hours.  CMP     Component Value Date/Time   NA 136 02/10/2024 0514   K 3.7 02/10/2024 0514   CL 99 02/10/2024 0514   CO2 25 02/10/2024 0514   GLUCOSE 172 (H) 02/10/2024 0514   BUN 10 02/10/2024 0514   CREATININE 0.76 02/10/2024 0514   CREATININE 0.78 06/26/2023 0859   CALCIUM  8.8 (L) 02/10/2024 0514   PROT 6.0 (L) 02/07/2024 0252   ALBUMIN 2.2 (L) 02/07/2024 0252   AST 14 (L) 02/07/2024 0252   ALT 15 02/07/2024 0252   ALKPHOS 71 02/07/2024 0252   BILITOT 0.8 02/07/2024 0252   GFRNONAA >60 02/10/2024 0514   GFRAA >60 10/10/2018 0330   Lipase     Component Value Date/Time   LIPASE 31 05/23/2015 2010       Studies/Results: No results found.   Anti-infectives: Anti-infectives (From admission, onward)     Start     Dose/Rate Route Frequency Ordered Stop   02/09/24 1300  Ampicillin-Sulbactam (UNASYN) 3 g in sodium chloride  0.9 % 100 mL IVPB        3 g 200 mL/hr over 30 Minutes Intravenous Every 6 hours 02/09/24 1203     02/06/24 1800  linezolid (ZYVOX) tablet 600 mg  Status:  Discontinued        600 mg Oral Every 12 hours 02/06/24 0926 02/09/24 1203   02/04/24 2200  cefTRIAXone (ROCEPHIN) 2 g in sodium chloride  0.9 % 100 mL IVPB  Status:  Discontinued        2 g 200 mL/hr over 30 Minutes Intravenous Every 24 hours 02/04/24 0334 02/09/24 1203   02/04/24 0600  vancomycin (VANCOREADY) IVPB 1250 mg/250 mL  Status:  Discontinued        1,250 mg 166.7 mL/hr over 90 Minutes Intravenous Every 12 hours 02/04/24 0352 02/06/24 0926   02/04/24 0030  vancomycin (VANCOCIN) IVPB 1000 mg/200 mL premix        1,000 mg 200 mL/hr over 60 Minutes Intravenous  Once 02/04/24 0017 02/04/24 0150   02/03/24 2245  cefTRIAXone (ROCEPHIN) 2 g in sodium chloride  0.9 % 100 mL IVPB        2 g 200  mL/hr over 30 Minutes Intravenous  Once 02/03/24 2230 02/03/24 2333        Assessment/Plan Right medial thigh abscess POD#7 S/p I&D 10/14 Dr. Vernetta  POD#4 s/p I&D 10/17 DB  - afebrile, WBC improving (12) - no role for further surgical debridement. Stable for discharge from a [facility] standpoint. Would recommend 5 days of PO abx.  - continue BID wet-to-dry dressing changes - follow up and pain Rx provided   FEN: carb mod ID: - UNasyn' cultures w/ GPC, GNR; FEW ACTINOMYCES NAESLUNDII, MODERATE BACTEROIDES SPECIES NOT FRAGILIS BETA LACTAMASE POSITIVE, folow VTE: SCD's  AKI - improved today DM2 OSA HLD HTN Obesity PMH multiple CVA's with residual right sided weakness.     LOS: 8 days   I reviewed nursing notes, hospitalist notes, last 24 h vitals and pain scores, last 48 h intake and output, last 24 h labs and trends, and last 24 h imaging results.  This care required moderate level of medical decision  making.   Almarie Pringle, PA-C [facility] Please see Amion for pager number during day hours 7:00am-4:30pm

## 2024-02-12 NOTE — Progress Notes (Addendum)
 Spoke with patient and he stated his girlfriend was able to come over the weekend to see the packing of his wound, and can take care of it at home.

## 2024-02-12 NOTE — Progress Notes (Signed)
 02/12/2024 1:30 PM -----------------------------------------------------------[facility]--------------------------------------------------- D(Data) A(Action) R(response)     Data: Discharge Readiness Assessment EDD today 02/12/2024    Action: Chart reviewed    Response: No immediate Barriers to discharge identified at this time.     Dajohn Ellender, RN [employer]

## 2024-02-13 ENCOUNTER — Other Ambulatory Visit (HOSPITAL_COMMUNITY): Payer: Self-pay

## 2024-02-14 ENCOUNTER — Other Ambulatory Visit: Payer: Self-pay

## 2024-02-14 ENCOUNTER — Encounter (HOSPITAL_COMMUNITY): Payer: Self-pay | Admitting: Radiology

## 2024-02-14 ENCOUNTER — Other Ambulatory Visit (HOSPITAL_COMMUNITY): Payer: Self-pay

## 2024-02-14 ENCOUNTER — Emergency Department (HOSPITAL_COMMUNITY)
Admission: EM | Admit: 2024-02-14 | Discharge: 2024-02-14 | Disposition: A | Discharge status: Home / Self Care | Attending: Emergency Medicine | Admitting: Emergency Medicine

## 2024-02-14 ENCOUNTER — Emergency Department (HOSPITAL_COMMUNITY)

## 2024-02-14 DIAGNOSIS — R509 Fever, unspecified: Secondary | ICD-10-CM | POA: Diagnosis not present

## 2024-02-14 DIAGNOSIS — R2241 Localized swelling, mass and lump, right lower limb: Secondary | ICD-10-CM | POA: Diagnosis present

## 2024-02-14 DIAGNOSIS — G8918 Other acute postprocedural pain: Secondary | ICD-10-CM | POA: Insufficient documentation

## 2024-02-14 DIAGNOSIS — D72829 Elevated white blood cell count, unspecified: Secondary | ICD-10-CM | POA: Insufficient documentation

## 2024-02-14 LAB — BASIC METABOLIC PANEL WITH GFR
Anion gap: 14 (ref 5–15)
BUN: 7 mg/dL (ref 6–20)
CO2: 22 mmol/L (ref 22–32)
Calcium: 8.7 mg/dL — ABNORMAL LOW (ref 8.9–10.3)
Chloride: 98 mmol/L (ref 98–111)
Creatinine, Ser: 0.87 mg/dL (ref 0.61–1.24)
GFR, Estimated: >60 mL/min (ref >60)
Glucose, Bld: 150 mg/dL — ABNORMAL HIGH (ref 70–99)
Potassium: 4 mmol/L (ref 3.5–5.1)
Sodium: 134 mmol/L — ABNORMAL LOW (ref 135–145)

## 2024-02-14 LAB — CBC WITH DIFFERENTIAL/PLATELET
Abs Immature Granulocytes: 0.26 K/uL — ABNORMAL HIGH (ref 0.00–0.07)
Basophils Absolute: 0 K/uL (ref 0.0–0.1)
Basophils Relative: 0 %
Eosinophils Absolute: 0.2 K/uL (ref 0.0–0.5)
Eosinophils Relative: 2 %
HCT: 33.8 % — ABNORMAL LOW (ref 39.0–52.0)
Hemoglobin: 11.3 g/dL — ABNORMAL LOW (ref 13.0–17.0)
Immature Granulocytes: 2 %
Lymphocytes Relative: 13 %
Lymphs Abs: 1.5 K/uL (ref 0.7–4.0)
MCH: 30 pg (ref 26.0–34.0)
MCHC: 33.4 g/dL (ref 30.0–36.0)
MCV: 89.7 fL (ref 80.0–100.0)
Monocytes Absolute: 0.5 K/uL (ref 0.1–1.0)
Monocytes Relative: 4 %
Neutro Abs: 9 K/uL — ABNORMAL HIGH (ref 1.7–7.7)
Neutrophils Relative %: 79 %
Platelets: 433 K/uL — ABNORMAL HIGH (ref 150–400)
RBC: 3.77 MIL/uL — ABNORMAL LOW (ref 4.22–5.81)
RDW: 12.4 % (ref 11.5–15.5)
WBC: 11.5 K/uL — ABNORMAL HIGH (ref 4.0–10.5)
nRBC: 0 % (ref 0.0–0.2)

## 2024-02-14 LAB — I-STAT CG4 LACTIC ACID, ED: Lactic Acid, Venous: 1 mmol/L (ref 0.5–1.9)

## 2024-02-14 MED ORDER — HYDROMORPHONE HCL 1 MG/ML IJ SOLN
1.0000 mg | Freq: Once | INTRAMUSCULAR | Status: AC
  Administered 2024-02-14: 1 mg via INTRAVENOUS
  Filled 2024-02-14: qty 1

## 2024-02-14 MED ORDER — IOHEXOL 350 MG/ML SOLN
100.0000 mL | Freq: Once | INTRAVENOUS | Status: AC | PRN
  Administered 2024-02-14: 100 mL via INTRAVENOUS

## 2024-02-14 NOTE — ED Provider Notes (Signed)
 [place] [facility] DEPARTMENT AT [facility] Provider Note   CSN: [id number] Arrival date & time: 02/14/24  1811     Patient presents with: Post-op Problem   Christopher Roberson is a 48 y.o. male.   HPI 48 year old male with a history of diabetes, obesity, and prior stroke presents with right thigh swelling.  The patient was admitted and ultimately was discharged 2 days ago.  He was found to have a right thigh abscess that was debrided twice.  He is currently taking amoxicillin-clavulanate.  Starting last night he developed a low-grade fever of 99.  He has been having increased swelling and increased drainage and pain from the surgical site.  No scrotal pain or swelling.  Prior to Admission medications   Medication Sig Start Date End Date Taking? Authorizing Provider  acetaminophen  (TYLENOL ) 500 MG tablet Take 2 tablets (1,000 mg total) by mouth every 6 (six) hours as needed. 02/12/24   Augustus Almarie RAMAN, PA-C  amoxicillin-clavulanate (AUGMENTIN) 875-125 MG tablet Take 1 tablet by mouth 2 (two) times daily for 10 days. 02/12/24 02/22/24  Akula, Vijaya, MD  atorvastatin  (LIPITOR ) 80 MG tablet Take 80 mg by mouth every evening.    [provider]  blood glucose meter kit and supplies KIT Dispense based on patient and insurance preference. Use up to four times daily as directed. 04/30/21   Krishnan, Gokul, MD  Blood Glucose Monitoring Suppl DEVI 1 each by Does not apply route in the morning, at noon, and at bedtime. May substitute to any manufacturer covered by patient's insurance. 06/28/23   Motwani, Komal, MD  clopidogrel  (PLAVIX ) 75 MG tablet Take 1 tablet (75 mg total) by mouth daily. 05/01/21   Krishnan, Gokul, MD  Continuous Glucose Sensor (DEXCOM G7 SENSOR) MISC Change sensor every 3 days. 07/26/23   Motwani, Komal, MD  dapagliflozin propanediol (FARXIGA) 10 MG TABS tablet Take 10 mg by mouth daily. 08/13/20   [provider]  ezetimibe (ZETIA) 10 MG tablet  Take 10 mg by mouth every evening.    [provider]  HUMALOG KWIKPEN 100 UNIT/ML KwikPen Inject 0-15 Units into the skin 3 (three) times daily. CBG 70 - 120: 0 units; CBG 121 - 150: 2 units; CBG 151 - 200: 3 units; CBG 201 - 250: 5 units; CBG 251 - 300: 8 units; CBG 301 - 350: 11 units; CBG 351 - 400: 15 units 02/12/24   Cherlyn Labella, MD  hydrALAZINE  (APRESOLINE ) 50 MG tablet Take 50 mg by mouth 2 (two) times daily. 01/23/24   [provider]  Insulin  Pen Needle (PEN NEEDLES) 32G X 4 MM MISC Use as directed in the morning, at noon, in the evening, and at bedtime. 07/26/23   Motwani, Komal, MD  Insulin  Pen Needle 32G X 4 MM MISC Use as directed 04/30/21   Verdene Purchase, MD  Insulin  Pen Needle 32G X 4 MM MISC Use 3 (three) times daily. 02/12/24   Akula, Vijaya, MD  losartan  (COZAAR ) 100 MG tablet Take 100 mg by mouth daily. 05/31/21   [provider]  metFORMIN  (GLUCOPHAGE ) 1000 MG tablet Take 1 tablet (1,000 mg total) by mouth 2 (two) times daily with a meal. 04/30/21   Verdene Purchase, MD  NIFEdipine (PROCARDIA XL/NIFEDICAL-XL) 90 MG 24 hr tablet Take 90 mg by mouth daily. 01/31/24   [provider]  nitroGLYCERIN (NITROSTAT) 0.4 MG SL tablet Place 0.4 mg under the tongue every 5 (five) minutes as needed. 01/23/24   [provider]  oxyCODONE (OXY IR/ROXICODONE) 5 MG immediate release tablet Take 1 tablet (5 mg total) by mouth every 6 (six) hours as needed for severe pain (pain score 7-10). 02/12/24   Augustus Almarie RAMAN, PA-C  pantoprazole  (PROTONIX ) 40 MG tablet Take 40 mg by mouth daily.    [provider]  pioglitazone  (ACTOS ) 30 MG tablet Take 1 tablet (30 mg total) by mouth daily. 07/26/23 08/25/23  Motwani, Komal, MD  sertraline  (ZOLOFT ) 100 MG tablet Take 100 mg by mouth daily.    [provider]  tadalafil (CIALIS) 20 MG tablet Take 20 mg by mouth daily as needed.    [provider]  traZODone  (DESYREL ) 100 MG tablet Take 100-300  mg by mouth at bedtime as needed for sleep. 07/26/18   [provider]  traZODone  (DESYREL ) 50 MG tablet Take 50 mg by mouth at bedtime.    [provider]  TRULICITY  0.75 MG/0.5ML SOAJ Inject 0.75 mg into the skin every Monday. 01/23/24   [provider]  Vitamin D, Ergocalciferol, (DRISDOL) 1.25 MG (50000 UNIT) CAPS capsule Take 50,000 Units by mouth once a week.    [provider]    Allergies: Percocet [oxycodone-acetaminophen ]    Review of Systems  Constitutional:  Positive for fever.  Musculoskeletal:  Positive for myalgias.  Skin:  Positive for wound.    Updated Vital Signs BP (!) 170/96   Pulse (!) 47   Temp 98.3 F (36.8 C)   Resp 17   Ht 5' 8 (1.727 m)   Wt 121.6 kg   SpO2 100%   BMI 40.75 kg/m   Physical Exam Vitals and nursing note reviewed.  Constitutional:      Appearance: He is well-developed. He is obese.  HENT:     Head: Normocephalic and atraumatic.  Cardiovascular:     Rate and Rhythm: Normal rate and regular rhythm.     Pulses:          Dorsalis pedis pulses are 2+ on the right side.  Pulmonary:     Effort: Pulmonary effort is normal.  Musculoskeletal:       Legs:  Skin:    General: Skin is warm and dry.  Neurological:     Mental Status: He is alert.     (all labs ordered are listed, but only abnormal results are displayed) Labs Reviewed  CBC WITH DIFFERENTIAL/PLATELET - Abnormal; Notable for the following components:      Result Value   WBC 11.5 (*)    RBC 3.77 (*)    Hemoglobin 11.3 (*)    HCT 33.8 (*)    Platelets 433 (*)    Neutro Abs 9.0 (*)    Abs Immature Granulocytes 0.26 (*)    All other components within normal limits  BASIC METABOLIC PANEL WITH GFR - Abnormal; Notable for the following components:   Sodium 134 (*)    Glucose, Bld 150 (*)    Calcium  8.7 (*)    All other components within normal limits  I-STAT CG4 LACTIC ACID, ED    EKG: None  Radiology: CT FEMUR RIGHT W  CONTRAST Result Date: 02/14/2024 CLINICAL DATA:  Recent right inguinal surgery, erythema and swelling extending to midthigh EXAM: CT OF THE LOWER RIGHT EXTREMITY WITH CONTRAST TECHNIQUE: Multidetector CT imaging of the lower right extremity was performed according to the standard protocol following intravenous contrast administration. RADIATION DOSE REDUCTION: This exam was performed according to the departmental dose-optimization program which includes automated exposure control, adjustment  of the mA and/or kV according to patient size and/or use of iterative reconstruction technique. CONTRAST:  OMNIPAQUE  IOHEXOL  350 MG/ML SOLN COMPARISON:  02/03/2024 FINDINGS: Bones/Joint/Cartilage No acute or destructive bony abnormalities. 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 Alignment is anatomic. Ligaments Suboptimally assessed by CT. Muscles and Tendons No acute findings. Soft tissues Interval debridement of the anteromedial right thigh abscess seen previously. Resolution of the subcutaneous gas and fluid collection after debridement. There is extensive subcutaneous fat stranding from the right inguinal region through the medial right thigh consistent with cellulitis. Reactive lymphadenopathy within the right inguinal region. Reconstructed images demonstrate no additional findings. IMPRESSION: 1. Interval incision and drainage of the right thigh abscess, with resolution of the fluid collection. 2. Extensive subcutaneous fat stranding extending from the right inguinal region through the medial right thigh consistent with cellulitis. This appears less pronounced since prior study. 3. Reactive lymphadenopathy within the right inguinal region. 4. No acute or destructive bony abnormalities. Electronically Signed   By: Ozell Daring M.D.   On: 02/14/2024 22:31     Procedures   Medications Ordered in the ED  HYDROmorphone (DILAUDID) injection 1 mg (1 mg Intravenous Given 02/14/24 2048)  iohexol   (OMNIPAQUE ) 350 MG/ML injection 100 mL (100 mLs Intravenous Contrast Given 02/14/24 2213)                                    Medical Decision Making Amount and/or Complexity of Data Reviewed Labs:     Details: Mild leukocytosis Radiology: ordered and independent interpretation performed.    Details: No fluid collection  Risk Prescription drug management.   Patient presents with postoperative pain.  Reported low-grade fevers at home.  He has a leukocytosis of 11 but this is similar to when he was in the hospital.  He is otherwise well-appearing.  His CT does not show any new or worsening signs of infection.  I do not think he needs a change in antibiotics or IV antibiotics.  No indication of sepsis.  At this point, he is feeling a lot better and feels comfortable going home.  Will recommend continued wound care, follow-up with general surgery, and will give return precautions.     Final diagnoses:  Post-operative pain    ED Discharge Orders     None          Freddi Hamilton, MD 02/14/24 2242

## 2024-02-14 NOTE — ED Provider Triage Note (Signed)
 Emergency Medicine Provider Triage Evaluation Note  Nial Hawe , a 48 y.o. male  was evaluated in triage.  Pt complains of right groin abscess.  He is recently postop.  His home health nurse saw drainage and fever and recommended he come into the emergency department after speaking with his surgeon's office.  Review of Systems  Positive: As above Negative: As above  Physical Exam  BP (!) 166/85   Pulse (!) 53   Temp 98.3 F (36.8 C)   Resp 16   Ht 5' 8 (1.727 m)   Wt 121.6 kg   SpO2 94%   BMI 40.75 kg/m  Gen:   Awake, no distress   Resp:  Normal effort  MSK:   Moves extremities without difficulty  Other:    Medical Decision Making  Medically screening exam initiated at 6:43 PM.  Appropriate orders placed.  Mycal Biermann was informed that the remainder of the evaluation will be completed by another provider, this initial triage assessment does not replace that evaluation, and the importance of remaining in the ED until their evaluation is complete. Notified nursing that patient needs a bed soon   Hildegard Loge, PA-C 02/14/24 1844

## 2024-02-14 NOTE — ED Notes (Signed)
 Patient to CT.

## 2024-02-14 NOTE — Discharge Instructions (Addendum)
 Continue wound care as instructed when you are in the hospital.  Follow-up with the general surgery office.  If you develop fever, new or worsening pain, or any other new/concerning symptoms then return to the ER.

## 2024-02-14 NOTE — ED Notes (Signed)
 Awaiting patient from lobby.

## 2024-02-14 NOTE — ED Triage Notes (Signed)
 He had surgery on 17th for an abscess. Last night he began having fever and chills. He endorses the highest temp he has gotten was 99.9. He was advised by home health to come to the hospital for evaluation.

## 2024-02-14 NOTE — ED Notes (Signed)
 Three IV's attempted without success. Will consult someone who is US  trained.

## 2024-02-14 NOTE — Discharge Summary (Signed)
 Physician Discharge Summary   Patient: Christopher Roberson MRN: [id number] DOB: [birth year]  Admit date:     02/03/2024  Discharge date: 02/12/2024  Discharge Physician: Elgie Butter   PCP: [facility]   Recommendations at discharge:  Please follow up with PCP in 1 week.  Please follow up with infectious disease on October 30 th.  Please follow up with [facility] as recommended.  Please follow up with general surgery as needed.   Discharge Diagnoses: Active Problems:   Sepsis due to cellulitis (HCC)   Abscess of right thigh   Type 2 diabetes mellitus with hyperglycemia, with long-term current use of insulin  (HCC)   Essential hypertension   Obesity, Class III, BMI 40-49.9 (morbid obesity) (HCC)   Hemiplegia of right dominant side as late effect of cerebral infarction (HCC)   Hypokalemia  Principal Problem (Resolved):   Right sided weakness  Hospital Course:  Christopher Roberson is a 48 y.o. 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  LKW 1930.  CT head showing no acute intracranial abnormality.  CTA head and neck negative for LVO. Seen by teleneurology and they are suspecting stroke recrudescence phenomenon versus recurrent ischemic stroke.  Recommend admission for stroke workup.    Assessment and Plan:  * Right sided weakness-resolved as of 02/06/2024 02/05/24 no new stroke on MRI brain. He has chronic right sided weakness due to old prior CVA. Therapy evaluations recommended no follow up.     Abscess of right thigh/ S/p I&D , twice,.   Cultures showing ACTINOMYCES NAESLUNDII and bactereoids sps General surgery on board recommended discharge on oral antibiotics for 5 more days.  Patient underwent repeat debridement, and drainage of the abscess of the right thigh abscess on  10/17.  Leukocytosis improving.  Currently on Unasyn ,  discussed with Dr Fleeta Rothman , recommended transition to Augmentin for 10 days and follow up with ID on October 30th for more antibiotics.  Discussed with the patient and recommended to come to [facility] to pick up the extra 5 days of augmentin.  He said he will come to [facility] at 3 pm to pick up the prescriptions.  Pain control with oxycodone   fiance comfortable with dressing changes She is requesting HH RN /k wound care at home for atleast a couple of visits.  Will place TOC for HH.    Sepsis due to cellulitis (HCC) 02/05/24 present on admission. Cellulitis and abscess right thigh.      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    Obesity, Class III, BMI 40-49.9 (morbid obesity) (HCC) 02/05/24 Body mass index is 43.59 kg/m.       Essential hypertension Well controlled.      Type 2 diabetes mellitus with hyperglycemia, with long-term current use of insulin  (HCC) Resume home meds on discharge.  Hypokalemia Replaced.        Hyponatremia Resolved.      AKI Resolved.      Mild normocytic anemia Monitor.  Hemoglobin stable around 11.      Thrombocytosis From infection.        Estimated body mass index is 44.89 kg/m as calculated from the following:   Height as of this encounter: 5' 7 (1.702 m).   Weight as of this encounter: 130 kg.  Consultants: gen surgery, curb side with ID, Procedures performed: incision and Drainage.  Disposition: Home Diet recommendation:  Discharge Diet Orders (From admission, onward)     Start     Ordered   02/12/24 0000  Diet - low sodium heart healthy        02/12/24 1121           Carb modified diet DISCHARGE MEDICATION: Allergies as of 02/12/2024       Reactions   Percocet [oxycodone-acetaminophen ] Itching   Can take if takes with Benadryl         Medication List     STOP taking these medications     methocarbamol  500 MG tablet Commonly known as: ROBAXIN    NovoLOG  FlexPen 100 UNIT/ML FlexPen Generic drug: insulin  aspart Replaced by: HumaLOG KwikPen 100 UNIT/ML KwikPen   spironolactone  25 MG tablet Commonly known as: ALDACTONE        TAKE these medications    acetaminophen  500 MG tablet Commonly known as: TYLENOL  Take 2 tablets (1,000 mg total) by mouth every 6 (six) hours as needed.   amoxicillin-clavulanate 875-125 MG tablet Commonly known as: AUGMENTIN Take 1 tablet by mouth 2 (two) times daily for 10 days.   atorvastatin  80 MG tablet Commonly known as: LIPITOR  Take 80 mg by mouth every evening. What changed: Another medication with the same name was removed. Continue taking this medication, and follow the directions you see here.   blood glucose meter kit and supplies Kit Dispense based on patient and insurance preference. Use up to four times daily as directed.   Blood Glucose Monitoring Suppl Devi 1 each by Does not apply route in the morning, at noon, and at bedtime. May substitute to any manufacturer covered by patient's insurance.   clopidogrel  75 MG tablet Commonly known as: PLAVIX  Take 1 tablet (75 mg total) by mouth daily.   dapagliflozin propanediol 10 MG Tabs tablet Commonly known as: FARXIGA Take 10 mg by mouth daily.   Dexcom G7 Sensor Misc Change sensor every 3 days.   ezetimibe 10 MG tablet Commonly known as: ZETIA Take 10 mg by mouth every evening.   HumaLOG KwikPen 100 UNIT/ML KwikPen Generic drug: insulin  lispro Inject 0-15 Units into the skin 3 (three) times daily. CBG 70 - 120: 0 units; CBG 121 - 150: 2 units; CBG 151 - 200: 3 units; CBG 201 - 250: 5 units; CBG 251 - 300: 8 units; CBG 301 - 350: 11 units; CBG 351 - 400: 15 units Replaces: NovoLOG  FlexPen 100 UNIT/ML FlexPen   hydrALAZINE  50 MG tablet Commonly known as: APRESOLINE  Take 50 mg by mouth 2 (two) times daily.   Insulin  Pen Needle 32G X 4 MM Misc Use as directed   BD  Pen Needle Nano 2nd Gen 32G X 4 MM Misc Generic drug: Insulin  Pen Needle Use as directed in the morning, at noon, in the evening, and at bedtime.   losartan  100 MG tablet Commonly known as: COZAAR  Take 100 mg by mouth daily.   metFORMIN  1000 MG tablet Commonly known as: GLUCOPHAGE  Take 1 tablet (1,000 mg total) by mouth 2 (two) times daily with a meal.   NIFEdipine 90 MG 24 hr tablet Commonly known as: PROCARDIA XL/NIFEDICAL-XL Take 90 mg by mouth daily.   nitroGLYCERIN 0.4 MG SL tablet Commonly known as: NITROSTAT Place 0.4 mg under the tongue every 5 (five) minutes as needed.   oxyCODONE 5 MG immediate release tablet Commonly known as: Oxy IR/ROXICODONE Take 1 tablet (5 mg total)  by mouth every 6 (six) hours as needed for severe pain (pain score 7-10).   pantoprazole  40 MG tablet Commonly known as: PROTONIX  Take 40 mg by mouth daily.   pioglitazone  30 MG tablet Commonly known as: Actos  Take 1 tablet (30 mg total) by mouth daily.   sertraline  100 MG tablet Commonly known as: ZOLOFT  Take 100 mg by mouth daily.   tadalafil 20 MG tablet Commonly known as: CIALIS Take 20 mg by mouth daily as needed.   traZODone  50 MG tablet Commonly known as: DESYREL  Take 50 mg by mouth at bedtime.   traZODone  100 MG tablet Commonly known as: DESYREL  Take 100-300 mg by mouth at bedtime as needed for sleep.   Trulicity  0.75 MG/0.5ML Soaj Generic drug: Dulaglutide  Inject 0.75 mg into the skin every Monday.   Vitamin D (Ergocalciferol) 1.25 MG (50000 UNIT) Caps capsule Commonly known as: DRISDOL Take 50,000 Units by mouth once a week.               Discharge Care Instructions  (From admission, onward)           Start     Ordered   02/12/24 0000  Discharge wound care:       Comments: Twice daily moist-to-dry dressing changes to right thigh. Pack with a rolled guaze (kerlix) moisted with saline and trimmed to size, cover with ABD and secure with tape or clean  briefs.  02/09/24 1048   02/12/24 1121            Contact information for follow-up providers     Maczis, Puja Gosai, PA-C Follow up.   Specialty: General Surgery Why: our office is scheduling you for wound check in 2-3 weeks. call to confirm appointment date/time. Contact information: [address] [place] [address] [phone]              Contact information for after-discharge care     Home Medical Care     [facility]) .   Service: Home Health Services Contact information: [address] [place]  [address] [phone]                    Discharge Exam: Fredricka Weights   02/04/24 2350 02/06/24 1311  Weight: 130 kg 130 kg   General exam: Appears calm and comfortable  Respiratory system: Clear to auscultation. Respiratory effort normal. Cardiovascular system: S1 & S2 heard, RRR. No JVD, Gastrointestinal system: Abdomen is nondistended, soft and nontender. Central nervous system: Alert and oriented. No focal neurological deficits. Extremities: Symmetric 5 x 5 power. Skin: right thigh wound bandaged.  Psychiatry: Judgement and insight appear normal. Mood & affect appropriate.    Condition at discharge: fair  The results of significant diagnostics from this hospitalization (including imaging, microbiology, ancillary and laboratory) are listed below for reference.   Imaging Studies: ECHOCARDIOGRAM COMPLETE BUBBLE STUDY Result Date: 02/04/2024    ECHOCARDIOGRAM REPORT   Patient Name:   ARTEM BUNTE Date of Exam: 02/04/2024 Medical Rec #:  [id number]      Height:       68.0 in Accession #:    [id number]     Weight:       284.0 lb Date of Birth:  1975/11/12      BSA:          2.372 m Patient Age:    48 years       BP:  142/87 mmHg Patient Gender: M              HR:           81 bpm. Exam Location:  Inpatient Procedure: 2D Echo, Cardiac Doppler, Color Doppler and Saline Contrast  Bubble            Study (Both Spectral and Color Flow Doppler were utilized during            procedure). Indications:    Stroke  History:        Patient has prior history of Echocardiogram examinations, most                 recent 04/25/2022. Stroke; Risk Factors:Sleep Apnea, Hypertension,                 Diabetes, Obesity and Non-Smoker.  Sonographer:    Logan Shove RDCS Referring Phys: [id number] VASUNDHRA RATHORE IMPRESSIONS  1. Left ventricular ejection fraction, by estimation, is 65 to 70%. The left ventricle has normal function. The left ventricle has no regional wall motion abnormalities. There is moderate concentric left ventricular hypertrophy. Left ventricular diastolic parameters are consistent with Grade II diastolic dysfunction (pseudonormalization). Elevated left atrial pressure.  2. Right ventricular systolic function is normal. The right ventricular size is normal. Tricuspid regurgitation signal is inadequate for assessing PA pressure.  3. Left atrial size was moderately dilated.  4. The mitral valve is normal in structure. No evidence of mitral valve regurgitation. No evidence of mitral stenosis.  5. The aortic valve is tricuspid. Aortic valve regurgitation is not visualized. No aortic stenosis is present.  6. The inferior vena cava is normal in size with greater than 50% respiratory variability, suggesting right atrial pressure of 3 mmHg.  7. Agitated saline contrast bubble study was negative, with no evidence of any interatrial shunt. Comparison(s): No significant change from prior study. Prior images reviewed side by side. FINDINGS  Left Ventricle: Left ventricular ejection fraction, by estimation, is 65 to 70%. The left ventricle has normal function. The left ventricle has no regional wall motion abnormalities. The left ventricular internal cavity size was normal in size. There is  moderate concentric left ventricular hypertrophy. Left ventricular diastolic parameters are consistent with Grade II  diastolic dysfunction (pseudonormalization). Elevated left atrial pressure. Right Ventricle: The right ventricular size is normal. No increase in right ventricular wall thickness. Right ventricular systolic function is normal. Tricuspid regurgitation signal is inadequate for assessing PA pressure. Left Atrium: Left atrial size was moderately dilated. Right Atrium: Right atrial size was normal in size. Pericardium: There is no evidence of pericardial effusion. Mitral Valve: The mitral valve is normal in structure. No evidence of mitral valve regurgitation. No evidence of mitral valve stenosis. Tricuspid Valve: The tricuspid valve is normal in structure. Tricuspid valve regurgitation is trivial. Aortic Valve: The aortic valve is tricuspid. Aortic valve regurgitation is not visualized. No aortic stenosis is present. Aortic valve peak gradient measures 7.4 mmHg. Pulmonic Valve: The pulmonic valve was normal in structure. Pulmonic valve regurgitation is not visualized. No evidence of pulmonic stenosis. Aorta: The aortic root and ascending aorta are structurally normal, with no evidence of dilitation. Venous: The inferior vena cava is normal in size with greater than 50% respiratory variability, suggesting right atrial pressure of 3 mmHg. IAS/Shunts: No atrial level shunt detected by color flow Doppler. Agitated saline contrast was given intravenously to evaluate for intracardiac shunting. Agitated saline contrast bubble study was negative, with no evidence of any interatrial  shunt.  LEFT VENTRICLE PLAX 2D LVIDd:         5.10 cm      Diastology LVIDs:         3.40 cm      LV e' medial:    4.90 cm/s LV PW:         1.40 cm      LV E/e' medial:  16.2 LV IVS:        1.40 cm      LV e' lateral:   5.11 cm/s LVOT diam:     2.20 cm      LV E/e' lateral: 15.6 LVOT Area:     3.80 cm  LV Volumes (MOD) LV vol d, MOD A2C: 180.0 ml LV vol d, MOD A4C: 198.0 ml LV vol s, MOD A2C: 69.6 ml LV vol s, MOD A4C: 64.1 ml LV SV MOD A2C:      110.4 ml LV SV MOD A4C:     198.0 ml LV SV MOD BP:      121.4 ml RIGHT VENTRICLE             IVC RV Basal diam:  3.90 cm     IVC diam: 1.70 cm RV S prime:     15.60 cm/s TAPSE (M-mode): 1.8 cm LEFT ATRIUM              Index        RIGHT ATRIUM           Index LA diam:        4.40 cm  1.85 cm/m   RA Area:     18.80 cm LA Vol (A2C):   125.0 ml 52.69 ml/m  RA Volume:   55.60 ml  23.44 ml/m LA Vol (A4C):   59.1 ml  24.91 ml/m LA Biplane Vol: 89.1 ml  37.56 ml/m  AORTIC VALVE AV Area (Vmax): 3.66 cm AV Vmax:        136.00 cm/s AV Peak Grad:   7.4 mmHg LVOT Vmax:      131.00 cm/s  AORTA Ao Root diam: 3.30 cm Ao Asc diam:  3.80 cm MITRAL VALVE MV Area (PHT): 3.99 cm    SHUNTS MV Decel Time: 190 msec    Systemic Diam: 2.20 cm MV E velocity: 79.60 cm/s MV A velocity: 82.60 cm/s MV E/A ratio:  0.96 Mihai Croitoru MD Electronically signed by Jerel Balding MD Signature Date/Time: 02/04/2024/2:40:32 PM    Final    MR BRAIN WO CONTRAST Result Date: 02/04/2024 EXAM: MR Brain without Intravenous Contrast. CLINICAL HISTORY: 48 year old male. Code Stroke presentation yesterday. Neuro deficit, acute, stroke suspected. TECHNIQUE: Magnetic resonance images of the brain without intravenous contrast in multiple planes. CONTRAST: Without. COMPARISON: CT head and CTA head and neck 02/03/2024. Brain MRI 05/07/2022. FINDINGS: BRAIN: No restricted diffusion to indicate acute infarction. No intracranial mass or hemorrhage. No midline shift or extra-axial fluid collection. No cerebellar tonsillar ectopia. The central arterial and venous flow voids are patent. Major vascular flow voids appear stable from last year and negative. Linear and small chronic infarcts in the left cerebellar hemisphere are new or increased from last year but chronic (series 8 image 9). Background brain volume remains normal. Scattered small but age advanced white matter T2 and FLAIR hyperintensity in both hemispheres, especially the left corona radiata this  is stable, nonspecific, but suggestive of chronic small vessel disease. No cortical encephalomalacia identified. Deep gray nuclei and brainstem remain normal. VENTRICLES: No hydrocephalus. ORBITS:  The orbits are normal. SINUSES AND MASTOIDS: The sinuses and mastoid air cells are clear. BONES: No acute fracture or focal osseous lesion. IMPRESSION: 1. No acute intracranial abnormality. 2. Progressed but chronic small infarcts in the left cerebellar hemisphere. Stable mild but age-advanced white matter changes suggestive of chronic small vessel disease. Electronically signed by: Helayne Hurst MD 02/04/2024 10:26 AM EDT RP Workstation: [id number]   CT ANGIO HEAD NECK W WO CM Result Date: 02/03/2024 EXAM: CTA HEAD AND NECK WITHOUT AND WITH IV CONTRAST 02/03/2024 10:51:54 PM TECHNIQUE: CTA of the head and neck was performed without and with the administration of 75mL intravenous contrast (iohexol  (OMNIPAQUE ) 350 MG/ML injection 75 mL IOHEXOL  350 MG/ML SOLN). Multiplanar 2D and/or 3D reformatted images are provided for review. Automated exposure control, iterative reconstruction, and/or weight based adjustment of the mA/kV was utilized to reduce the radiation dose to as low as reasonably achievable. Stenosis of the internal carotid arteries measured using NASCET criteria. COMPARISON: None available CLINICAL HISTORY: Stroke suspected (Ped 0-17y); right hemiparesis. FINDINGS: AORTIC ARCH AND ARCH VESSELS: No dissection or arterial injury. No significant stenosis of the brachiocephalic or subclavian arteries. CERVICAL CAROTID ARTERIES: No dissection, arterial injury, or hemodynamically significant stenosis by NASCET criteria. CERVICAL VERTEBRAL ARTERIES: No dissection, arterial injury, or significant stenosis. LUNGS AND MEDIASTINUM: Unremarkable. SOFT TISSUES: No acute abnormality. BONES: No acute abnormality. ANTERIOR CIRCULATION: No significant stenosis of the internal carotid arteries. No significant stenosis of the  anterior cerebral arteries. No significant stenosis of the middle cerebral arteries. No aneurysm. POSTERIOR CIRCULATION: No significant stenosis of the posterior cerebral arteries. No significant stenosis of the basilar artery. No significant stenosis of the vertebral arteries. No aneurysm. OTHER: No dural venous sinus thrombosis on this non-dedicated study. IMPRESSION: 1. No large vessel occlusion, hemodynamically significant stenosis, or aneurysm in the head or neck. Electronically signed by: Gilmore Molt MD 02/03/2024 11:14 PM EDT RP Workstation: [id number]   CT EXTREMITY LOWER RIGHT W CONTRAST Result Date: 02/03/2024 CLINICAL DATA:  Soft tissue swelling in the right thigh EXAM: CT OF THE LOWER RIGHT EXTREMITY WITH CONTRAST TECHNIQUE: Multidetector CT imaging of the lower right extremity was performed according to the standard protocol following intravenous contrast administration. RADIATION DOSE REDUCTION: This exam was performed according to the departmental dose-optimization program which includes automated exposure control, adjustment of the mA and/or kV according to patient size and/or use of iterative reconstruction technique. CONTRAST:  75mL OMNIPAQUE  IOHEXOL  350 MG/ML SOLN COMPARISON:  None Available. FINDINGS: Bones/Joint/Cartilage Right femur is well visualized and within normal limits. No acute fracture is noted. Ligaments Suboptimally assessed by CT. Muscles and Tendons Musculature of the right thigh appears within normal limits. No focal hematoma is noted. Soft tissues Considerable subcutaneous edema is identified within the anteromedial aspect of the right thigh. Overlying skin thickening is noted consistent with cellulitis. Foci of air are noted superiorly as well as a small air-fluid collection which measures approximately 2.4 cm in greatest dimension consistent with a small abscess. Reactive adenopathy in the right inguinal region is seen. No other soft tissue abnormality is noted.  IMPRESSION: Subcutaneous abscess with significant surrounding edema skin thickening. The air-fluid collection measures approximately 2.4 cm. Electronically Signed   By: Oneil Devonshire M.D.   On: 02/03/2024 23:12   CT HEAD CODE STROKE WO CONTRAST (LKW 0-4.5h, LVO 0-24h) Result Date: 02/03/2024 EXAM: CT HEAD WITHOUT CONTRAST 02/03/2024 09:59:12 PM TECHNIQUE: CT of the head was performed without the administration of intravenous contrast. Automated exposure control, iterative reconstruction, and/or weight  based adjustment of the mA/kV was utilized to reduce the radiation dose to as low as reasonably achievable. COMPARISON: None available. CLINICAL HISTORY: Neuro deficit, acute, stroke suspected. FINDINGS: BRAIN AND VENTRICLES: No acute hemorrhage. No evidence of acute infarct. No hydrocephalus. No extra-axial collection. No mass effect or midline shift. ORBITS: No acute abnormality. SINUSES: No acute abnormality. SOFT TISSUES AND SKULL: No acute soft tissue abnormality. No skull fracture. IMPRESSION: 1. No acute intracranial abnormality.  ASPECTS 10. Findings discussed with Dr. Zammit via telephone at 10:17 PM Electronically signed by: Gilmore Molt MD 02/03/2024 10:19 PM EDT RP Workstation: [id number]    Microbiology: Results for orders placed or performed during the hospital encounter of 02/03/24  Resp panel by RT-PCR (RSV, Flu A&B, Covid) Anterior Nasal Swab     Status: None   Collection Time: 02/03/24 10:19 PM   Specimen: Anterior Nasal Swab  Result Value Ref Range Status   SARS Coronavirus 2 by RT PCR NEGATIVE NEGATIVE Final    Comment: (NOTE) SARS-CoV-2 target nucleic acids are NOT DETECTED.  The SARS-CoV-2 RNA is generally detectable in upper respiratory specimens during the acute phase of infection. The lowest concentration of SARS-CoV-2 viral copies this assay can detect is 138 copies/mL. A negative result does not preclude SARS-Cov-2 infection and should not be used as the sole basis for  treatment or other patient management decisions. A negative result may occur with  improper specimen collection/handling, submission of specimen other than nasopharyngeal swab, presence of viral mutation(s) within the areas targeted by this assay, and inadequate number of viral copies(<138 copies/mL). A negative result must be combined with clinical observations, patient history, and epidemiological information. The expected result is Negative.  Fact Sheet for Patients:  BloggerCourse.com  Fact Sheet for Healthcare Providers:  SeriousBroker.it  This test is no t yet approved or cleared by the [place]  FDA and  has been authorized for detection and/or diagnosis of SARS-CoV-2 by FDA under an Emergency Use Authorization (EUA). This EUA will remain  in effect (meaning this test can be used) for the duration of the COVID-19 declaration under Section 564(b)(1) of the Act, 21 U.S.C.section 360bbb-3(b)(1), unless the authorization is terminated  or revoked sooner.       Influenza A by PCR NEGATIVE NEGATIVE Final   Influenza B by PCR NEGATIVE NEGATIVE Final    Comment: (NOTE) The Xpert Xpress SARS-CoV-2/FLU/RSV plus assay is intended as an aid in the diagnosis of influenza from Nasopharyngeal swab specimens and should not be used as a sole basis for treatment. Nasal washings and aspirates are unacceptable for Xpert Xpress SARS-CoV-2/FLU/RSV testing.  Fact Sheet for Patients: BloggerCourse.com  Fact Sheet for Healthcare Providers: SeriousBroker.it  This test is not yet approved or cleared by the [place]  FDA and has been authorized for detection and/or diagnosis of SARS-CoV-2 by FDA under an Emergency Use Authorization (EUA). This EUA will remain in effect (meaning this test can be used) for the duration of the COVID-19 declaration under Section 564(b)(1) of the Act, 21  U.S.C. section 360bbb-3(b)(1), unless the authorization is terminated or revoked.     Resp Syncytial Virus by PCR NEGATIVE NEGATIVE Final    Comment: (NOTE) Fact Sheet for Patients: BloggerCourse.com  Fact Sheet for Healthcare Providers: SeriousBroker.it  This test is not yet approved or cleared by the [place]  FDA and has been authorized for detection and/or diagnosis of SARS-CoV-2 by FDA under an Emergency Use Authorization (EUA). This EUA will remain in effect (meaning this test can be used)  for the duration of the COVID-19 declaration under Section 564(b)(1) of the Act, 21 U.S.C. section 360bbb-3(b)(1), unless the authorization is terminated or revoked.  Performed at [facility], [address], [place], [place] [address]   Urine Culture     Status: None   Collection Time: 02/04/24 12:25 AM   Specimen: Urine, Clean Catch  Result Value Ref Range Status   Specimen Description   Final    URINE, CLEAN CATCH Performed at [facility], [address], [place], [place] [address]    Special Requests   Final    NONE Performed at [facility], [address] [place], [place] [address]    Culture   Final    NO GROWTH Performed at [facility], [address] [place], [place] [address]    Report Status 02/05/2024 FINAL  Final  Culture, blood (Routine X 2) w Reflex to ID Panel     Status: None   Collection Time: 02/04/24  9:28 PM   Specimen: BLOOD LEFT HAND  Result Value Ref Range Status   Specimen Description BLOOD LEFT HAND  Final   Special Requests   Final    BOTTLES DRAWN AEROBIC AND ANAEROBIC Blood Culture adequate volume   Culture   Final    NO GROWTH 5 DAYS Performed at [facility], [address], [place], [place] [address]    Report Status 02/09/2024 FINAL  Final  Culture, blood (Routine X 2) w Reflex to ID Panel     Status: None    Collection Time: 02/04/24  9:28 PM   Specimen: BLOOD RIGHT HAND  Result Value Ref Range Status   Specimen Description BLOOD RIGHT HAND  Final   Special Requests   Final    BOTTLES DRAWN AEROBIC AND ANAEROBIC Blood Culture adequate volume   Culture   Final    NO GROWTH 5 DAYS Performed at [facility], [address] [place], [place] [address]    Report Status 02/09/2024 FINAL  Final  MRSA Next Gen by PCR, Nasal     Status: None   Collection Time: 02/05/24  6:14 PM   Specimen: Nasal Mucosa; Nasal Swab  Result Value Ref Range Status   MRSA by PCR Next Gen NOT DETECTED NOT DETECTED Final    Comment: (NOTE) The GeneXpert MRSA Assay (FDA approved for NASAL specimens only), is one component of a comprehensive MRSA colonization surveillance program. It is not intended to diagnose MRSA infection nor to guide or monitor treatment for MRSA infections. Test performance is not FDA approved in patients less than 74 years old. Performed at [facility], [address], [place], [place] [address]   Aerobic/Anaerobic Culture w Gram Stain (surgical/deep wound)     Status: None   Collection Time: 02/06/24  2:31 PM   Specimen: Path fluid; Body Fluid  Result Value Ref Range Status   Specimen Description ABSCESS RIGHT GROIN  Final   Special Requests PT VANC ROCEPHIN  Final   Gram Stain   Final    NO WBC SEEN FEW GRAM POSITIVE COCCI RARE GRAM NEGATIVE RODS    Culture   Final    FEW ACTINOMYCES NAESLUNDII Standardized susceptibility testing for this organism is not available. MODERATE BACTEROIDES SPECIES NOT FRAGILIS BETA LACTAMASE POSITIVE Performed at [facility], [address] [place], [place] [address]    Report Status 02/09/2024 FINAL  Final    Labs: CBC: Recent  Labs  Lab 02/08/24 1103 02/09/24 1222 02/10/24 0514 02/12/24 0553  WBC 17.8* 16.3* 17.9* 12.5*  NEUTROABS 13.6* 13.9* 13.2*  --   HGB 10.9* 11.7* 11.4* 10.4*  HCT 32.5* 35.2* 34.2* 32.3*  MCV 89.8  89.8 88.8 90.7  PLT 460* 491* 521* 444*   Basic Metabolic Panel: Recent Labs  Lab 02/08/24 1103 02/09/24 1222 02/10/24 0514  NA 133* 135 136  K 3.8 4.5 3.7  CL 99 98 99  CO2 24 24 25   GLUCOSE 190* 226* 172*  BUN 13 9 10   CREATININE 1.01 0.84 0.76  CALCIUM  8.3* 8.7* 8.8*   Liver Function Tests: No results for input(s): AST, ALT, ALKPHOS, BILITOT, PROT, ALBUMIN in the last 168 hours. CBG: Recent Labs  Lab 02/11/24 1605 02/11/24 2024 02/11/24 2347 02/12/24 0337 02/12/24 0803  GLUCAP 194* 155* 136* 133* 134*    Discharge time spent: 42 minutes  Signed: Elgie Butter, MD [facility]

## 2024-02-19 ENCOUNTER — Other Ambulatory Visit (HOSPITAL_COMMUNITY): Payer: Self-pay

## 2024-02-21 ENCOUNTER — Other Ambulatory Visit (HOSPITAL_COMMUNITY): Payer: Self-pay

## 2024-02-22 ENCOUNTER — Other Ambulatory Visit: Payer: Self-pay

## 2024-02-22 ENCOUNTER — Ambulatory Visit (INDEPENDENT_AMBULATORY_CARE_PROVIDER_SITE_OTHER): Admitting: Internal Medicine

## 2024-02-22 ENCOUNTER — Encounter: Payer: Self-pay | Admitting: Internal Medicine

## 2024-02-22 VITALS — BP 167/92 | HR 61 | Temp 99.2°F | Wt 274.0 lb

## 2024-02-22 DIAGNOSIS — L02415 Cutaneous abscess of right lower limb: Secondary | ICD-10-CM

## 2024-02-22 MED ORDER — AMOXICILLIN-POT CLAVULANATE 875-125 MG PO TABS: 1.0000 | ORAL_TABLET | Freq: Two times a day (BID) | ORAL | 0 refills | Status: AC

## 2024-02-22 NOTE — Progress Notes (Unsigned)
 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 (HCC) 02/05/2024   Sepsis due to cellulitis (HCC) 02/04/2024   Abscess of right thigh 02/04/2024   Weakness of right upper extremity 05/22/2021   HLD (hyperlipidemia) 04/29/2021   Compression of right ulnar nerve at multiple levels 07/30/2020   Paresthesias 05/22/2020   Carpal tunnel syndrome of right wrist 02/26/2020   Ulnar neuropathy of right upper extremity 03/04/2019   Ulnar nerve compression, left 02/11/2019   Status post placement of implantable loop recorder 12/07/2018   Degenerative cervical disc 11/04/2018   Cerebellar cerebrovascular accident (CVA) without late effect 05/09/2018   Bell's palsy 05/09/2018   Dry eye syndrome of both lacrimal glands 03/19/2018   Vitreous floaters of both eyes 03/19/2018   Moderate episode of recurrent major depressive disorder (HCC) 02/06/2018   SI (sacroiliac) pain 11/30/2017   GAD (generalized anxiety disorder) 07/20/2017   Severe nonproliferative diabetic retinopathy of both eyes without macular edema associated with type 2 diabetes mellitus (HCC) 07/20/2017   Thoracic radiculopathy 02/21/2017   Chronic right-sided low back pain with right-sided sciatica 02/16/2017   Alcohol abuse 11/01/2016   Severe episode of recurrent major depressive disorder, without psychotic features (HCC) 10/31/2016   Chronic pain of right knee 03/24/2016   Palpitations 08/17/2015   Vitamin D deficiency 06/24/2015   Recurrent depression 06/10/2015   Umbilical hernia 06/10/2015   Personality disorder (HCC) 04/03/2015   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  He was recently admitted 10/12 - 10/24 right-sided weakness suspected stroke, MRI negative.  Found to have abscess of right thigh status post I&D x 2 cultures growing actinomyces naeslundii, general surgery was consulted recommended oral antibiotics.  Patient underwent repeat MRI right thigh on 10/17 ID was engaged patient discharged on Augmentin x 10 days with follow-up. Since discharged had fever and wound pain. Ct improvded. Augmentin continued, no longer having fevers ROS  Past Medical History:  Diagnosis Date   Acute CVA (cerebrovascular accident) (HCC) 04/29/2021   Depression    Diabetes mellitus    Hypertension    Obesity    Sleep apnea    On CPAP machine   Stroke [facility])     Outpatient Medications Prior to Visit  Medication Sig Dispense Refill   acetaminophen  (TYLENOL ) 500 MG tablet Take 2 tablets (1,000 mg total) by mouth every 6 (six) hours as needed.     amoxicillin-clavulanate (AUGMENTIN) 875-125 MG tablet Take 1 tablet by mouth 2 (two) times daily for 10 days. 20 tablet 0   atorvastatin  (LIPITOR ) 80 MG tablet Take 80 mg by mouth every evening.     blood glucose meter kit and supplies KIT Dispense based on patient and insurance preference. Use up to four times daily as directed. 1 each 0  Blood Glucose Monitoring Suppl DEVI 1 each by Does not apply route in the morning, at noon, and at bedtime. May substitute to any manufacturer covered by patient's insurance. 1 each 0   clopidogrel  (PLAVIX ) 75 MG tablet Take 1 tablet (75 mg total) by mouth daily. 30 tablet 3   Continuous  Glucose Sensor (DEXCOM G7 SENSOR) MISC Change sensor every 3 days. 9 each 3   dapagliflozin propanediol (FARXIGA) 10 MG TABS tablet Take 10 mg by mouth daily.     ezetimibe (ZETIA) 10 MG tablet Take 10 mg by mouth every evening.     HUMALOG KWIKPEN 100 UNIT/ML KwikPen Inject 0-15 Units into the skin 3 (three) times daily. CBG 70 - 120: 0 units; CBG 121 - 150: 2 units; CBG 151 - 200: 3 units; CBG 201 - 250: 5 units; CBG 251 - 300: 8 units; CBG 301 - 350: 11 units; CBG 351 - 400: 15 units 15 mL 11   hydrALAZINE  (APRESOLINE ) 50 MG tablet Take 50 mg by mouth 2 (two) times daily.     Insulin  Pen Needle (PEN NEEDLES) 32G X 4 MM MISC Use as directed in the morning, at noon, in the evening, and at bedtime. 200 each 2   Insulin  Pen Needle 32G X 4 MM MISC Use as directed 100 each 2   Insulin  Pen Needle 32G X 4 MM MISC Use 3 (three) times daily. 100 each 0   losartan  (COZAAR ) 100 MG tablet Take 100 mg by mouth daily.     metFORMIN  (GLUCOPHAGE ) 1000 MG tablet Take 1 tablet (1,000 mg total) by mouth 2 (two) times daily with a meal. 60 tablet 2   NIFEdipine (PROCARDIA XL/NIFEDICAL-XL) 90 MG 24 hr tablet Take 90 mg by mouth daily.     nitroGLYCERIN (NITROSTAT) 0.4 MG SL tablet Place 0.4 mg under the tongue every 5 (five) minutes as needed.     oxyCODONE (OXY IR/ROXICODONE) 5 MG immediate release tablet Take 1 tablet (5 mg total) by mouth every 6 (six) hours as needed for severe pain (pain score 7-10). 20 tablet 0   pantoprazole  (PROTONIX ) 40 MG tablet Take 40 mg by mouth daily.     pioglitazone  (ACTOS ) 30 MG tablet Take 1 tablet (30 mg total) by mouth daily. 30 tablet 3   sertraline  (ZOLOFT ) 100 MG tablet Take 100 mg by mouth daily.     tadalafil (CIALIS) 20 MG tablet Take 20 mg by mouth daily as needed.     traZODone  (DESYREL ) 100 MG tablet Take 100-300 mg by mouth at bedtime as needed for sleep.     traZODone  (DESYREL ) 50 MG tablet Take 50 mg by mouth at bedtime.     TRULICITY  0.75 MG/0.5ML SOAJ Inject 0.75 mg  into the skin every Monday.     Vitamin D, Ergocalciferol, (DRISDOL) 1.25 MG (50000 UNIT) CAPS capsule Take 50,000 Units by mouth once a week.     No facility-administered medications prior to visit.     Allergies  Allergen Reactions   Percocet [Oxycodone-Acetaminophen ] Itching    Can take if takes with Benadryl     Social History   Tobacco Use   Smoking status: Never   Smokeless tobacco: Never  Vaping Use   Vaping status: Never Used  Substance Use Topics   Alcohol use: No   Drug use: Never    Family History  Problem Relation Age of Onset   Heart disease Mother    Colon cancer Father    Cancer Paternal Grandmother  Prostate cancer Paternal Grandfather    Colon cancer Paternal Uncle    Prostate cancer Paternal Uncle    Prostate cancer Paternal Uncle    Esophageal cancer Neg Hx    Rectal cancer Neg Hx    Stomach cancer Neg Hx     Objective:  There were no vitals filed for this visit. There is no height or weight on file to calculate BMI.  Physical Exam  Lab Results: Lab Results  Component Value Date   WBC 11.5 (H) 02/14/2024   HGB 11.3 (L) 02/14/2024   HCT 33.8 (L) 02/14/2024   MCV 89.7 02/14/2024   PLT 433 (H) 02/14/2024    Lab Results  Component Value Date   CREATININE 0.87 02/14/2024   BUN 7 02/14/2024   NA 134 (L) 02/14/2024   K 4.0 02/14/2024   CL 98 02/14/2024   CO2 22 02/14/2024    Lab Results  Component Value Date   ALT 15 02/07/2024   AST 14 (L) 02/07/2024   ALKPHOS 71 02/07/2024   BILITOT 0.8 02/07/2024     Assessment & Plan:  48 year old male with history of diabetes and right-sided weakness history of prior CVA presents for management of right thigh abscess.  During recent hospitalization 02-03-09/20 patient underwent I&D right thigh abscess CT right lower extremity on 10/11 had shown subcutaneous abscess and surrounding edema.  Patient underwent I&D with general surgery on 10/14, cultures obtained of purulence, noted abscess cavity  going deep into thigh.  Cultures growing actinomyces and Bacteroides species  -underwent repeat I&D on 10/17, no cultures obtained.  Noted some extension purulent fluid superiorly along incision of thigh to her buttock, no necrosis of evidence of necrotic infection. - Patient discharged on Augmentin x 10 days. - pt seen in the ed on 10/22 for thight pain. Ct showed resolution of abscess right thing, extensive subcutaneous fat straining from right inguinal to medial thigh, c/w cellulits less pronouced from piro sudy -sees general surgery next week -fd/u with idin 2 weeks, coninue augmetin till then. Wound healing as expected Loney Stank, MD [facility]   02/22/24  10:56 AM

## 2024-02-23 LAB — CBC WITH DIFFERENTIAL/PLATELET
Absolute Lymphocytes: 1528 {cells}/uL (ref 850–3900)
Absolute Monocytes: 572 {cells}/uL (ref 200–950)
Basophils Absolute: 18 {cells}/uL (ref 0–200)
Basophils Relative: 0.3 %
Eosinophils Absolute: 83 {cells}/uL (ref 15–500)
Eosinophils Relative: 1.4 %
HCT: 31.7 % — ABNORMAL LOW (ref 38.5–50.0)
Hemoglobin: 10.5 g/dL — ABNORMAL LOW (ref 13.2–17.1)
MCH: 29.8 pg (ref 27.0–33.0)
MCHC: 33.1 g/dL (ref 32.0–36.0)
MCV: 90.1 fL (ref 80.0–100.0)
MPV: 10.2 fL (ref 7.5–12.5)
Monocytes Relative: 9.7 %
Neutro Abs: 3699 {cells}/uL (ref 1500–7800)
Neutrophils Relative %: 62.7 %
Platelets: 382 Thousand/uL (ref 140–400)
RBC: 3.52 Million/uL — ABNORMAL LOW (ref 4.20–5.80)
RDW: 12.9 % (ref 11.0–15.0)
Total Lymphocyte: 25.9 %
WBC: 5.9 Thousand/uL (ref 3.8–10.8)

## 2024-02-23 LAB — SEDIMENTATION RATE: Sed Rate: 29 mm/h — ABNORMAL HIGH (ref 0–15)

## 2024-02-23 LAB — C-REACTIVE PROTEIN: CRP: 4 mg/L (ref <8.0)

## 2024-02-23 LAB — COMPLETE METABOLIC PANEL WITHOUT GFR
AG Ratio: 1.2 (calc) (ref 1.0–2.5)
ALT: 14 U/L (ref 9–46)
AST: 13 U/L (ref 10–40)
Albumin: 3.7 g/dL (ref 3.6–5.1)
Alkaline phosphatase (APISO): 75 U/L (ref 36–130)
BUN/Creatinine Ratio: 7 (calc) (ref 6–22)
BUN: 5 mg/dL — ABNORMAL LOW (ref 7–25)
CO2: 28 mmol/L (ref 20–32)
Calcium: 9.1 mg/dL (ref 8.6–10.3)
Chloride: 104 mmol/L (ref 98–110)
Creat: 0.71 mg/dL (ref 0.60–1.29)
Globulin: 3.2 g/dL (ref 1.9–3.7)
Glucose, Bld: 204 mg/dL — ABNORMAL HIGH (ref 65–99)
Potassium: 3.7 mmol/L (ref 3.5–5.3)
Sodium: 139 mmol/L (ref 135–146)
Total Bilirubin: 0.7 mg/dL (ref 0.2–1.2)
Total Protein: 6.9 g/dL (ref 6.1–8.1)

## 2024-03-07 ENCOUNTER — Other Ambulatory Visit: Payer: Self-pay

## 2024-03-07 ENCOUNTER — Ambulatory Visit (INDEPENDENT_AMBULATORY_CARE_PROVIDER_SITE_OTHER): Admitting: Internal Medicine

## 2024-03-07 VITALS — BP 157/69 | HR 57 | Temp 98.3°F | Wt 270.0 lb

## 2024-03-07 DIAGNOSIS — L02415 Cutaneous abscess of right lower limb: Secondary | ICD-10-CM | POA: Diagnosis not present

## 2024-03-07 NOTE — Patient Instructions (Signed)
 Follow up in 2 weeks or next available to assess how progress off of abx

## 2024-03-07 NOTE — Progress Notes (Signed)
 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 (HCC) 02/05/2024   Sepsis due to cellulitis (HCC) 02/04/2024   Abscess of right thigh 02/04/2024   Weakness of right upper extremity 05/22/2021   HLD (hyperlipidemia) 04/29/2021   Compression of right ulnar nerve at multiple levels 07/30/2020   Paresthesias 05/22/2020   Carpal tunnel syndrome of right wrist 02/26/2020   Ulnar neuropathy of right upper extremity 03/04/2019   Ulnar nerve compression, left 02/11/2019   Status post placement of implantable loop recorder 12/07/2018   Degenerative cervical disc 11/04/2018   Cerebellar cerebrovascular accident (CVA) without late effect 05/09/2018   Bell's palsy 05/09/2018   Dry eye syndrome of both lacrimal glands 03/19/2018   Vitreous floaters of both eyes 03/19/2018   Moderate episode of recurrent major depressive disorder (HCC) 02/06/2018   SI (sacroiliac) pain 11/30/2017   GAD (generalized anxiety disorder) 07/20/2017   Severe nonproliferative diabetic retinopathy of both eyes without macular edema associated with type 2 diabetes mellitus (HCC) 07/20/2017   Thoracic radiculopathy 02/21/2017   Chronic right-sided low back pain with right-sided sciatica 02/16/2017   Alcohol abuse 11/01/2016   Severe episode of recurrent major depressive disorder, without psychotic features (HCC) 10/31/2016   Chronic pain of right knee 03/24/2016   Palpitations 08/17/2015   Vitamin D deficiency 06/24/2015   Recurrent depression 06/10/2015   Umbilical hernia 06/10/2015   Personality disorder (HCC) 04/03/2015   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   AMOXICILLIN -CLAVULANATE (AUGMENTIN ) 875-125 MG TABLET    Take 1 tablet by mouth 2 (two) times daily for 14 days.   ATORVASTATIN  (LIPITOR ) 80 MG TABLET    Take 80 mg by mouth every evening.   BLOOD GLUCOSE METER KIT AND SUPPLIES KIT    Dispense based on patient and insurance preference. Use up to four times daily as directed.   BLOOD GLUCOSE MONITORING SUPPL DEVI    1 each by Does not apply route in the morning, at noon, and at bedtime. May substitute to any manufacturer covered by patient's insurance.   CLOPIDOGREL  (PLAVIX ) 75 MG TABLET    Take 1 tablet (75 mg total) by mouth daily.   CONTINUOUS GLUCOSE SENSOR (DEXCOM G7 SENSOR) MISC    Change sensor every 3 days.   DAPAGLIFLOZIN PROPANEDIOL (FARXIGA) 10 MG TABS TABLET    Take 10 mg by mouth daily.   EZETIMIBE  (ZETIA ) 10 MG TABLET    Take 10 mg by mouth every evening.   HUMALOG  KWIKPEN 100 UNIT/ML KWIKPEN    Inject 0-15 Units into the skin 3 (three) times daily. CBG 70 - 120: 0 units; CBG 121 - 150: 2 units; CBG 151 - 200: 3 units; CBG 201 - 250: 5 units; CBG 251 - 300: 8 units; CBG 301 - 350: 11 units; CBG 351 - 400: 15 units   HYDRALAZINE  (APRESOLINE )  50 MG TABLET    Take 50 mg by mouth 2 (two) times daily.   INSULIN  PEN NEEDLE (PEN NEEDLES) 32G X 4 MM MISC    Use as directed in the morning, at noon, in the evening, and at bedtime.   INSULIN  PEN NEEDLE 32G X 4 MM MISC    Use as directed   INSULIN  PEN NEEDLE 32G X 4 MM MISC    Use 3 (three) times daily.   LOSARTAN  (COZAAR ) 100 MG TABLET    Take 100 mg by mouth daily.   METFORMIN  (GLUCOPHAGE ) 1000 MG TABLET    Take 1 tablet (1,000 mg total) by mouth 2 (two) times daily with a meal.   NIFEDIPINE  (PROCARDIA  XL/NIFEDICAL-XL) 90 MG 24 HR  TABLET    Take 90 mg by mouth daily.   NITROGLYCERIN (NITROSTAT) 0.4 MG SL TABLET    Place 0.4 mg under the tongue every 5 (five) minutes as needed.   OXYCODONE  (OXY IR/ROXICODONE ) 5 MG IMMEDIATE RELEASE TABLET    Take 1 tablet (5 mg total) by mouth every 6 (six) hours as needed for severe pain (pain score 7-10).   PANTOPRAZOLE  (PROTONIX ) 40 MG TABLET    Take 40 mg by mouth daily.   PIOGLITAZONE  (ACTOS ) 30 MG TABLET    Take 1 tablet (30 mg total) by mouth daily.   SERTRALINE  (ZOLOFT ) 100 MG TABLET    Take 100 mg by mouth daily.   TADALAFIL (CIALIS) 20 MG TABLET    Take 20 mg by mouth daily as needed.   TRAZODONE  (DESYREL ) 100 MG TABLET    Take 100-300 mg by mouth at bedtime as needed for sleep.   TRAZODONE  (DESYREL ) 50 MG TABLET    Take 50 mg by mouth at bedtime.   TRULICITY  0.75 MG/0.5ML SOAJ    Inject 0.75 mg into the skin every Monday.   VITAMIN D, ERGOCALCIFEROL, (DRISDOL) 1.25 MG (50000 UNIT) CAPS CAPSULE    Take 50,000 Units by mouth once a week.  Modified Medications   No medications on file  Discontinued Medications   No medications on file    Subjective: Christopher Roberson is a  48 year old male with past medical history of type 2 diabetes, hypertension, hyperlipidemia, obesity, OSA on CPAP, previous stroke x 2 with residual right-sided weakness, presents for management of right thigh abscess.  He was recently admitted 10/12 - 10/24 right-sided weakness suspected stroke, MRI negative.  Found to have abscess of right thigh status post I&D x 2 cultures growing actinomyces naeslundii, general surgery was consulted recommended oral antibiotics.  Patient underwent repeat MRI right thigh on 10/17 ID was engaged patient discharged on Augmentin  x 10 days with follow-up. Since discharged had fever and wound pain. Ct improvded. Augmentin  continued, no longer having fevers Today: Last dose of Augmentin  yesterday.  No complaints. Review of Systems: Review of Systems  All other systems  reviewed and are negative.   Past Medical History:  Diagnosis Date   Acute CVA (cerebrovascular accident) (HCC) 04/29/2021   Depression    Diabetes mellitus    Hypertension    Obesity    Sleep apnea    On CPAP machine   Stroke [facility])     Social History   Tobacco Use   Smoking status: Never   Smokeless tobacco: Never  Vaping Use   Vaping status: Never Used  Substance Use Topics   Alcohol use: No   Drug use: Never    Family History  Problem Relation Age of Onset  Heart disease Mother    Colon cancer Father    Cancer Paternal Grandmother    Prostate cancer Paternal Grandfather    Colon cancer Paternal Uncle    Prostate cancer Paternal Uncle    Prostate cancer Paternal Uncle    Esophageal cancer Neg Hx    Rectal cancer Neg Hx    Stomach cancer Neg Hx     Allergies  Allergen Reactions   Percocet [Oxycodone -Acetaminophen ] Itching    Can take if takes with Benadryl     Health Maintenance  Topic Date Due   FOOT EXAM  Never done   Hepatitis C Screening  Never done   Hepatitis B Vaccines 19-59 Average Risk (1 of 3 - 19+ 3-dose series) Never done   OPHTHALMOLOGY EXAM  11/14/2019   Medicare Annual Wellness (AWV)  03/31/2022   Influenza Vaccine  11/24/2023   COVID-19 Vaccine (4 - 2025-26 season) 12/25/2023   Diabetic kidney evaluation - Urine ACR  06/25/2024   HEMOGLOBIN A1C  08/04/2024   Diabetic kidney evaluation - eGFR measurement  02/21/2025   DTaP/Tdap/Td (4 - Td or Tdap) 04/01/2031   Colonoscopy  11/19/2031   Pneumococcal Vaccine  Completed   HIV Screening  Completed   HPV VACCINES  Aged Out   Meningococcal B Vaccine  Aged Out    Objective:  Vitals:   03/07/24 0933  BP: (!) 157/69  Pulse: (!) 57  Temp: 98.3 F (36.8 C)  TempSrc: Oral  SpO2: 95%  Weight: 270 lb (122.5 kg)   Body mass index is 41.05 kg/m.  Physical Exam Constitutional:      General: He is not in acute distress.    Appearance: He is normal weight. He is not toxic-appearing.   HENT:     Head: Normocephalic and atraumatic.     Right Ear: External ear normal.     Left Ear: External ear normal.     Nose: No congestion or rhinorrhea.     Mouth/Throat:     Mouth: Mucous membranes are moist.     Pharynx: Oropharynx is clear.  Eyes:     Extraocular Movements: Extraocular movements intact.     Conjunctiva/sclera: Conjunctivae normal.     Pupils: Pupils are equal, round, and reactive to light.  Cardiovascular:     Rate and Rhythm: Normal rate and regular rhythm.     Heart sounds: No murmur heard.    No friction rub. No gallop.  Pulmonary:     Effort: Pulmonary effort is normal.     Breath sounds: Normal breath sounds.  Abdominal:     General: Abdomen is flat. Bowel sounds are normal.     Palpations: Abdomen is soft.  Musculoskeletal:        General: No swelling. Normal range of motion.     Cervical back: Normal range of motion and neck supple.  Skin:    General: Skin is warm and dry.  Neurological:     General: No focal deficit present.     Mental Status: He is oriented to person, place, and time.  Psychiatric:        Mood and Affect: Mood normal.     Lab Results Lab Results  Component Value Date   WBC 5.9 02/22/2024   HGB 10.5 (L) 02/22/2024   HCT 31.7 (L) 02/22/2024   MCV 90.1 02/22/2024   PLT 382 02/22/2024    Lab Results  Component Value Date   CREATININE 0.71 02/22/2024   BUN 5 (L) 02/22/2024   NA  139 02/22/2024   K 3.7 02/22/2024   CL 104 02/22/2024   CO2 28 02/22/2024    Lab Results  Component Value Date   ALT 14 02/22/2024   AST 13 02/22/2024   ALKPHOS 71 02/07/2024   BILITOT 0.7 02/22/2024    Lab Results  Component Value Date   CHOL 154 02/04/2024   HDL 67 02/04/2024   LDLCALC 73 02/04/2024   TRIG 73 02/04/2024   CHOLHDL 2.3 02/04/2024   No results found for: LABRPR, RPRTITER No results found for: HIV1RNAQUANT, HIV1RNAVL, CD4TABS   Problem List Items Addressed This Visit    None  Results   Assessment/Plan 48 year old male with history of diabetes and right-sided weakness history of prior CVA presents for management of right thigh abscess.  During recent hospitalization 02-03-09/20 patient underwent I&D right thigh abscess CT right lower extremity on 10/11 had shown subcutaneous abscess and surrounding edema.  Patient underwent I&D with general surgery on 10/14, cultures obtained of purulence, noted abscess cavity going deep into thigh.  Cultures growing actinomyces and Bacteroides species  -underwent repeat I&D on 10/17, no cultures obtained.  Noted some extension purulent fluid superiorly along incision of thigh to her buttock, no necrosis of evidence of necrotic infection. - Patient discharged on Augmentin  x 10 days. - Seen in the ed on 10/22 for thight pain. Ct showed resolution of abscess right thing, extensive subcutaneous fat straining from right inguinal to medial thigh, c/w cellulits less pronouced from piro sudy -seen by general surgery on 11/6, no tesd no signs of infeciton but did note cloudy yellow drainage. Recommenced packing with f/u in one week. -10/30 labs wbc 5.9, crp 4, esr 29-> stable   #right thigh abscess -Stop antibiotics, last dose of Augmentin  yesterday.  Patient has been on antibiotics for about a month, wound appears to be healing well.  No concern for active infection today.  Tissue looks healthy.  Last set of labs stable. - Will follow-up in 2 weeks or next available appointment to assess how patient is doing off of antibiotics.  He sees general surgery tomorrow.      Loney Stank, MD [facility] 03/07/2024, 9:38 AM  I personally spent a total of 42 minutes in the care of the patient today including preparing to see the patient, getting/reviewing separately obtained history, performing a medically appropriate exam/evaluation, documenting clinical information in the EHR, and  independently interpreting results.

## 2024-03-08 ENCOUNTER — Other Ambulatory Visit (HOSPITAL_COMMUNITY): Payer: Self-pay | Admitting: Student

## 2024-03-08 DIAGNOSIS — L02415 Cutaneous abscess of right lower limb: Secondary | ICD-10-CM

## 2024-03-12 ENCOUNTER — Ambulatory Visit (HOSPITAL_COMMUNITY)
Admission: RE | Admit: 2024-03-12 | Discharge: 2024-03-12 | Disposition: A | Discharge status: Home / Self Care | Source: Ambulatory Visit | Attending: Student | Admitting: Student

## 2024-03-12 DIAGNOSIS — L02415 Cutaneous abscess of right lower limb: Secondary | ICD-10-CM | POA: Diagnosis present

## 2024-03-12 MED ORDER — IOHEXOL 300 MG/ML  SOLN
100.0000 mL | Freq: Once | INTRAMUSCULAR | Status: AC | PRN
  Administered 2024-03-12: 100 mL via INTRAVENOUS

## 2024-03-12 MED ORDER — SODIUM CHLORIDE (PF) 0.9 % IJ SOLN
INTRAMUSCULAR | Status: AC
Start: 2024-03-12 — End: 2024-03-12
  Filled 2024-03-12: qty 50

## 2024-03-13 ENCOUNTER — Telehealth: Payer: Self-pay | Admitting: Neurology

## 2024-03-13 NOTE — Telephone Encounter (Signed)
 Received sleep referral from [facility] Luke Miyamoto NP to eval for sleep apnea. Placed in sleep referrals box

## 2024-03-26 ENCOUNTER — Emergency Department (HOSPITAL_COMMUNITY)

## 2024-03-26 ENCOUNTER — Other Ambulatory Visit: Payer: Self-pay

## 2024-03-26 ENCOUNTER — Observation Stay (HOSPITAL_COMMUNITY)
Admission: EM | Admit: 2024-03-26 | Discharge: 2024-03-27 | Source: Ambulatory Visit | Attending: Emergency Medicine | Admitting: Emergency Medicine

## 2024-03-26 ENCOUNTER — Encounter (HOSPITAL_COMMUNITY): Payer: Self-pay

## 2024-03-26 DIAGNOSIS — R6510 Systemic inflammatory response syndrome (SIRS) of non-infectious origin without acute organ dysfunction: Secondary | ICD-10-CM

## 2024-03-26 DIAGNOSIS — R079 Chest pain, unspecified: Secondary | ICD-10-CM

## 2024-03-26 DIAGNOSIS — L03115 Cellulitis of right lower limb: Secondary | ICD-10-CM | POA: Diagnosis not present

## 2024-03-26 LAB — URINALYSIS, W/ REFLEX TO CULTURE (INFECTION SUSPECTED)
Bacteria, UA: NONE SEEN
Bilirubin Urine: NEGATIVE
Glucose, UA: >=500 mg/dL — AB
Hgb urine dipstick: NEGATIVE
Ketones, ur: NEGATIVE mg/dL
Leukocytes,Ua: NEGATIVE
Nitrite: NEGATIVE
Protein, ur: NEGATIVE mg/dL
Specific Gravity, Urine: 1.032 — ABNORMAL HIGH (ref 1.005–1.030)
pH: 6 (ref 5.0–8.0)

## 2024-03-26 LAB — CBC WITH DIFFERENTIAL/PLATELET
Abs Immature Granulocytes: 0.02 K/uL (ref 0.00–0.07)
Basophils Absolute: 0.1 K/uL (ref 0.0–0.1)
Basophils Relative: 1 %
Eosinophils Absolute: 0.1 K/uL (ref 0.0–0.5)
Eosinophils Relative: 1 %
HCT: 40.8 % (ref 39.0–52.0)
Hemoglobin: 13.4 g/dL (ref 13.0–17.0)
Immature Granulocytes: 0 %
Lymphocytes Relative: 26 %
Lymphs Abs: 2 K/uL (ref 0.7–4.0)
MCH: 30.1 pg (ref 26.0–34.0)
MCHC: 32.8 g/dL (ref 30.0–36.0)
MCV: 91.7 fL (ref 80.0–100.0)
Monocytes Absolute: 0.6 K/uL (ref 0.1–1.0)
Monocytes Relative: 8 %
Neutro Abs: 5.1 K/uL (ref 1.7–7.7)
Neutrophils Relative %: 64 %
Platelets: 343 K/uL (ref 150–400)
RBC: 4.45 MIL/uL (ref 4.22–5.81)
RDW: 12.9 % (ref 11.5–15.5)
WBC: 7.9 K/uL (ref 4.0–10.5)
nRBC: 0 % (ref 0.0–0.2)

## 2024-03-26 LAB — COMPREHENSIVE METABOLIC PANEL WITH GFR
ALT: 13 U/L (ref 0–44)
AST: 19 U/L (ref 15–41)
Albumin: 4.4 g/dL (ref 3.5–5.0)
Alkaline Phosphatase: 63 U/L (ref 38–126)
Anion gap: 16 — ABNORMAL HIGH (ref 5–15)
BUN: 11 mg/dL (ref 6–20)
CO2: 22 mmol/L (ref 22–32)
Calcium: 9.5 mg/dL (ref 8.9–10.3)
Chloride: 103 mmol/L (ref 98–111)
Creatinine, Ser: 1.12 mg/dL (ref 0.61–1.24)
GFR, Estimated: >60 mL/min (ref >60)
Glucose, Bld: 213 mg/dL — ABNORMAL HIGH (ref 70–99)
Potassium: 3.5 mmol/L (ref 3.5–5.1)
Sodium: 141 mmol/L (ref 135–145)
Total Bilirubin: 1 mg/dL (ref 0.0–1.2)
Total Protein: 7.6 g/dL (ref 6.5–8.1)

## 2024-03-26 LAB — PRO BRAIN NATRIURETIC PEPTIDE: Pro Brain Natriuretic Peptide: 61.9 pg/mL (ref <300.0)

## 2024-03-26 LAB — TROPONIN T, HIGH SENSITIVITY
Troponin T High Sensitivity: 27 ng/L — ABNORMAL HIGH (ref 0–19)
Troponin T High Sensitivity: 26 ng/L — ABNORMAL HIGH (ref 0–19)

## 2024-03-26 LAB — I-STAT CG4 LACTIC ACID, ED
Lactic Acid, Venous: 4.2 mmol/L (ref 0.5–1.9)
Lactic Acid, Venous: 2.9 mmol/L (ref 0.5–1.9)

## 2024-03-26 MED ORDER — LACTATED RINGERS IV SOLN: INTRAVENOUS | Status: DC

## 2024-03-26 MED ORDER — IOHEXOL 300 MG/ML  SOLN
100.0000 mL | Freq: Once | INTRAMUSCULAR | Status: AC | PRN
  Administered 2024-03-26: 100 mL via INTRAVENOUS

## 2024-03-26 MED ORDER — HYDRALAZINE HCL 25 MG PO TABS: 50.0000 mg | ORAL_TABLET | Freq: Two times a day (BID) | ORAL | Status: DC

## 2024-03-26 MED ORDER — SODIUM CHLORIDE 0.9 % IV SOLN
2.0000 g | Freq: Once | INTRAVENOUS | Status: AC
  Administered 2024-03-26: 2 g via INTRAVENOUS
  Filled 2024-03-26: qty 20

## 2024-03-26 MED ORDER — VANCOMYCIN HCL IN DEXTROSE 1-5 GM/200ML-% IV SOLN
1000.0000 mg | Freq: Once | INTRAVENOUS | Status: AC
  Administered 2024-03-26: 1000 mg via INTRAVENOUS
  Filled 2024-03-26: qty 200

## 2024-03-26 NOTE — ED Provider Triage Note (Signed)
 Emergency Medicine Provider Triage Evaluation Note  [facility] Christopher Roberson , a 48 y.o. male  was evaluated in triage.  Pt was seen by primary care earlier today who recommended he come to the ED for further evaluation.  He was found to be tachycardic in the 120s and febrile at 101.  He has been feeling unwell for the last 3 days with progressively worsening shortness of breath, chest pressure, and occasional dizziness without syncope.  Shortness of breath is present while at rest and worse with exertion.  He was unaware he was febrile as he had not checked his temperature at home though he notes recent chills.  3 days ago he noted that his urine was dark-colored and he has been urinating more frequently.  He also notes recent I&D of right upper thigh abscess that was performed under anesthesia.  He is no longer on antibiotics and has follow-up with infectious disease tomorrow regarding this.  He notes this area has been hard and painful starting about 3 days ago as well.  He denies any drainage from this.  Patient denies lower extremity swelling recently.  Review of Systems  Positive: Fevers, chest pain, shortness of breath, dizziness, dark-colored urine, increased urinary frequency Negative: Syncope, lower extremity swelling  Physical Exam  BP (!) 159/85 (BP Location: Left Arm)   Pulse 99   Temp 98.9 F (37.2 C) (Oral)   Resp 20   Ht 5' 8 (1.727 m)   Wt 124.3 kg   SpO2 99%   BMI 41.66 kg/m  Gen:   Awake, no distress   Resp:  Normal effort, breath sounds are clear throughout. MSK:   Moves extremities without difficulty  Other:  Small area of induration to the right upper thigh beside well-healed surgical scar.  There is no active bleeding or drainage.  No erythema or warmth.  Systolic murmur appreciated; patient recalls being told he had a murmur in adolescence.  Medical Decision Making  Medically screening exam initiated at 7:04 PM.  Appropriate orders placed.  Tiandre Demetrius Formisano  was informed that the remainder of the evaluation will be completed by another provider, this initial triage assessment does not replace that evaluation, and the importance of remaining in the ED until their evaluation is complete.   Rosina Almarie LABOR, PA-C 03/26/24 1910

## 2024-03-26 NOTE — ED Provider Notes (Signed)
 [place] [facility] AT [facility] Provider Note   CSN: [id number] Arrival date & time: 03/26/24  1801     Patient presents with: Shortness of Breath   Christopher Roberson is a 48 y.o. male.   Patient is a 48 year old male who presents with fever and tachycardia.  He has a history of prior abscess in his right thigh status post I&D.  He has had 2 surgical I&D of the wound on October 14 and October 17.  He said overall has been doing well although he has noticed it is a little bit more swollen than it has been.  He started feeling weakness about 2 days ago.  He went to his doctor's office today for routine follow-up appointment from his previous hospitalization and he was noted to be febrile there with a temperature of 101 and tachycardic.  He also was having some shortness of breath while he was in the office and some discomfort in the center of his chest.  He took a nitroglycerin and his chest pain has resolved.  He does not currently feel short of breath.  He denies any cough or cold symptoms.  No abdominal pain.  No nausea or vomiting.  No urinary symptoms.       Prior to Admission medications   Medication Sig Start Date End Date Taking? Authorizing Provider  acetaminophen  (TYLENOL ) 500 MG tablet Take 2 tablets (1,000 mg total) by mouth every 6 (six) hours as needed. 02/12/24   Augustus Almarie RAMAN, PA-C  atorvastatin  (LIPITOR ) 80 MG tablet Take 80 mg by mouth every evening.    [provider]  blood glucose meter kit and supplies KIT Dispense based on patient and insurance preference. Use up to four times daily as directed. 04/30/21   Krishnan, Gokul, MD  Blood Glucose Monitoring Suppl DEVI 1 each by Does not apply route in the morning, at noon, and at bedtime. May substitute to any manufacturer covered by patient's insurance. 06/28/23   Motwani, Komal, MD  clopidogrel  (PLAVIX ) 75 MG tablet Take 1 tablet (75 mg total) by mouth daily. 05/01/21   Krishnan,  Gokul, MD  Continuous Glucose Sensor (DEXCOM G7 SENSOR) MISC Change sensor every 3 days. 07/26/23   Motwani, Komal, MD  dapagliflozin propanediol (FARXIGA) 10 MG TABS tablet Take 10 mg by mouth daily. 08/13/20   [provider]  ezetimibe  (ZETIA ) 10 MG tablet Take 10 mg by mouth every evening.    [provider]  HUMALOG  KWIKPEN 100 UNIT/ML KwikPen Inject 0-15 Units into the skin 3 (three) times daily. CBG 70 - 120: 0 units; CBG 121 - 150: 2 units; CBG 151 - 200: 3 units; CBG 201 - 250: 5 units; CBG 251 - 300: 8 units; CBG 301 - 350: 11 units; CBG 351 - 400: 15 units 02/12/24   Cherlyn Labella, MD  hydrALAZINE  (APRESOLINE ) 50 MG tablet Take 50 mg by mouth 2 (two) times daily. 01/23/24   [provider]  Insulin  Pen Needle (PEN NEEDLES) 32G X 4 MM MISC Use as directed in the morning, at noon, in the evening, and at bedtime. 07/26/23   Motwani, Komal, MD  Insulin  Pen Needle 32G X 4 MM MISC Use as directed 04/30/21   Verdene Purchase, MD  Insulin  Pen Needle 32G X 4 MM MISC Use 3 (three) times daily. 02/12/24   Akula, Vijaya, MD  losartan  (COZAAR ) 100 MG tablet Take 100 mg by mouth daily. 05/31/21   [provider]  metFORMIN  (GLUCOPHAGE )  1000 MG tablet Take 1 tablet (1,000 mg total) by mouth 2 (two) times daily with a meal. 04/30/21   Verdene Purchase, MD  NIFEdipine  (PROCARDIA  XL/NIFEDICAL-XL) 90 MG 24 hr tablet Take 90 mg by mouth daily. 01/31/24   [provider]  nitroGLYCERIN (NITROSTAT) 0.4 MG SL tablet Place 0.4 mg under the tongue every 5 (five) minutes as needed. 01/23/24   [provider]  oxyCODONE  (OXY IR/ROXICODONE ) 5 MG immediate release tablet Take 1 tablet (5 mg total) by mouth every 6 (six) hours as needed for severe pain (pain score 7-10). 02/12/24   Augustus Almarie RAMAN, PA-C  pantoprazole  (PROTONIX ) 40 MG tablet Take 40 mg by mouth daily.    [provider]  pioglitazone  (ACTOS ) 30 MG tablet Take 1 tablet (30 mg total) by mouth daily. 07/26/23  08/25/23  Motwani, Komal, MD  sertraline  (ZOLOFT ) 100 MG tablet Take 100 mg by mouth daily.    [provider]  tadalafil (CIALIS) 20 MG tablet Take 20 mg by mouth daily as needed.    [provider]  traZODone  (DESYREL ) 100 MG tablet Take 100-300 mg by mouth at bedtime as needed for sleep. 07/26/18   [provider]  traZODone  (DESYREL ) 50 MG tablet Take 50 mg by mouth at bedtime.    [provider]  TRULICITY  0.75 MG/0.5ML SOAJ Inject 0.75 mg into the skin every Monday. 01/23/24   [provider]  Vitamin D, Ergocalciferol, (DRISDOL) 1.25 MG (50000 UNIT) CAPS capsule Take 50,000 Units by mouth once a week.    [provider]    Allergies: Percocet [oxycodone -acetaminophen ]    Review of Systems  Constitutional:  Positive for fatigue and fever. Negative for chills and diaphoresis.  HENT:  Negative for congestion, rhinorrhea and sneezing.   Eyes: Negative.   Respiratory:  Positive for chest tightness and shortness of breath. Negative for cough.   Cardiovascular:  Negative for leg swelling.  Gastrointestinal:  Negative for abdominal pain, diarrhea, nausea and vomiting.  Genitourinary:  Negative for difficulty urinating, flank pain and frequency.  Musculoskeletal:  Negative for arthralgias and back pain.  Skin:  Positive for wound. Negative for rash.  Neurological:  Negative for dizziness, speech difficulty, weakness, numbness and headaches.    Updated Vital Signs BP (!) 161/87   Pulse 83   Temp 98 F (36.7 C)   Resp (!) 21   Ht 5' 8 (1.727 m)   Wt 124.3 kg   SpO2 91%   BMI 41.66 kg/m   Physical Exam Constitutional:      Appearance: He is well-developed.  HENT:     Head: Normocephalic and atraumatic.  Eyes:     Pupils: Pupils are equal, round, and reactive to light.  Cardiovascular:     Rate and Rhythm: Normal rate and regular rhythm.     Heart sounds: Normal heart sounds.  Pulmonary:     Effort: Pulmonary effort is normal.  No respiratory distress.     Breath sounds: Normal breath sounds. No wheezing or rales.  Chest:     Chest wall: No tenderness.  Abdominal:     General: Bowel sounds are normal.     Palpations: Abdomen is soft.     Tenderness: There is no abdominal tenderness. There is no guarding or rebound.  Musculoskeletal:        General: Normal range of motion.     Cervical back: Normal range of motion and neck supple.  Lymphadenopathy:     Cervical: No cervical  adenopathy.  Skin:    General: Skin is warm and dry.     Findings: No rash.     Comments: Patient has a healed scar from a prior surgical incision to his proximal right thigh.  There are some induration extending from the scar proximal toward his inguinal area.  I do not notice any fluctuance.  No erythema to the area.  No drainage from the wound.  Neurological:     Mental Status: He is alert and oriented to person, place, and time.     (all labs ordered are listed, but only abnormal results are displayed) Labs Reviewed  COMPREHENSIVE METABOLIC PANEL WITH GFR - Abnormal; Notable for the following components:      Result Value   Glucose, Bld 213 (*)    Anion gap 16 (*)    All other components within normal limits  URINALYSIS, W/ REFLEX TO CULTURE (INFECTION SUSPECTED) - Abnormal; Notable for the following components:   Specific Gravity, Urine 1.032 (*)    Glucose, UA >=500 (*)    All other components within normal limits  I-STAT CG4 LACTIC ACID, ED - Abnormal; Notable for the following components:   Lactic Acid, Venous 4.2 (*)    All other components within normal limits  I-STAT CG4 LACTIC ACID, ED - Abnormal; Notable for the following components:   Lactic Acid, Venous 2.9 (*)    All other components within normal limits  TROPONIN T, HIGH SENSITIVITY - Abnormal; Notable for the following components:   Troponin T High Sensitivity 27 (*)    All other components within normal limits  TROPONIN T, HIGH SENSITIVITY - Abnormal; Notable  for the following components:   Troponin T High Sensitivity 26 (*)    All other components within normal limits  CULTURE, BLOOD (ROUTINE X 2)  CULTURE, BLOOD (ROUTINE X 2)  CBC WITH DIFFERENTIAL/PLATELET  PRO BRAIN NATRIURETIC PEPTIDE    EKG: EKG Interpretation Date/Time:  Tuesday March 26 2024 22:39:14 EST Ventricular Rate:  72 PR Interval:  186 QRS Duration:  92 QT Interval:  400 QTC Calculation: 438 R Axis:   40  Text Interpretation: Sinus rhythm Abnormal T, consider ischemia, lateral leads since last tracing no significant change Confirmed by Lenor Hollering [phone]) on 03/26/2024 11:08:52 PM  Radiology: CT PELVIS W CONTRAST Result Date: 03/26/2024 CLINICAL DATA:  Thigh abscess EXAM: CT PELVIS WITH CONTRAST TECHNIQUE: Multidetector CT imaging of the pelvis was performed using the standard protocol following the bolus administration of intravenous contrast. RADIATION DOSE REDUCTION: This exam was performed according to the departmental dose-optimization program which includes automated exposure control, adjustment of the mA and/or kV according to patient size and/or use of iterative reconstruction technique. CONTRAST:  OMNIPAQUE  IOHEXOL  300 MG/ML  SOLN COMPARISON:  CT of the right femur 03/12/2024 FINDINGS: Urinary Tract:  No abnormality visualized. Bowel:  Unremarkable visualized pelvic bowel loops. Vascular/Lymphatic: No pathologically enlarged lymph nodes. No significant vascular abnormality seen. There some prominent bilateral inguinal lymph nodes. Reproductive:  Prostate gland is within normal limits. Other: There is no ascites. There are small supraumbilical fat containing midline ventral hernias. Musculoskeletal: There some mild skin thickening and subcutaneous scarring in the superior anteromedial right thigh which appears similar to the prior study. There is no evidence for fluid collection, soft tissue gas or foreign body. No acute osseous abnormality. IMPRESSION: 1. Mild  skin thickening and subcutaneous scarring in the superior anteromedial right thigh appears similar to the prior study. No evidence for fluid collection, soft tissue  gas or foreign body. 2. Prominent bilateral inguinal lymph nodes are likely reactive. 3. Small supraumbilical fat containing ventral hernias. Electronically Signed   By: Greig Pique M.D.   On: 03/26/2024 22:15   DG Chest 2 View Result Date: 03/26/2024 CLINICAL DATA:  Fever with tachycardia and weakness. EXAM: CHEST - 2 VIEW COMPARISON:  April 28, 2021 FINDINGS: The heart size and mediastinal contours are within normal limits. A loop recorder device is noted. Both lungs are clear. The visualized skeletal structures are unremarkable. IMPRESSION: No active cardiopulmonary disease. Electronically Signed   By: Suzen Dials M.D.   On: 03/26/2024 18:55     Procedures   Medications Ordered in the ED  lactated ringers  infusion ( Intravenous New Bag/Given 03/26/24 2103)  cefTRIAXone  (ROCEPHIN ) 2 g in sodium chloride  0.9 % 100 mL IVPB (0 g Intravenous Stopped 03/26/24 2221)  vancomycin  (VANCOCIN ) IVPB 1000 mg/200 mL premix (0 mg Intravenous Stopped 03/26/24 2221)  iohexol  (OMNIPAQUE ) 300 MG/ML solution 100 mL (100 mLs Intravenous Contrast Given 03/26/24 2152)                                    Medical Decision Making Amount and/or Complexity of Data Reviewed Labs: ordered. Radiology: ordered.  Risk Prescription drug management.   This patient presents to the ED for concern of fever, this involves an extensive number of treatment options, and is a complaint that carries with it a high risk of complications and morbidity.  I considered the following differential and admission for this acute, potentially life threatening condition.  The differential diagnosis includes sepsis, pneumonia, IV abscess versus cellulitis, UTI, abdominal infection  MDM:    Patient presents with reports of fever and tachycardia at his doctor's office today.   He was afebrile here with no significant tachycardia.  He did have some chest pain and shortness of breath in the doctor's office but did not have any here.  His EKG did not show any ischemic changes.  His troponins are minimally elevated but flat.  He does not have any respiratory symptoms that would be more concerning for pneumonia or viral respiratory infection.  Chest x-ray does not show any pneumonia or other concerns.  He does have some increased tenderness and induration around his right upper thigh where he had a prior I&D.  CT scan this area does not show any abscess.  His lactic acid is elevated over 4.  It did improve to 2.  He was given IV antibiotics and IV fluids.  Discussed with the hospitalist, Dr. Lou who will admit the patient for further treatment.  (Labs, imaging, consults)  Labs: I Ordered, and personally interpreted labs.  The pertinent results include: Normal white count, elevated lactic acid, minimally elevated troponins  Imaging Studies ordered: I ordered imaging studies including CT pelvis I independently visualized and interpreted imaging. I agree with the radiologist interpretation  Additional history obtained from chart.  External records from outside source obtained and reviewed including prior notes  Cardiac Monitoring: The patient was maintained on a cardiac monitor.  If on the cardiac monitor, I personally viewed and interpreted the cardiac monitored which showed an underlying rhythm of: Sinus rhythm  Reevaluation: After the interventions noted above, I reevaluated the patient and found that they have :improved  Social Determinants of Health:    Disposition: Admit to hospital  Co morbidities that complicate the patient evaluation  Past Medical History:  Diagnosis Date   Acute CVA (cerebrovascular accident) (HCC) 04/29/2021   Depression    Diabetes mellitus    Hypertension    Obesity    Sleep apnea    On CPAP machine   Stroke (HCC)       Medicines Meds ordered this encounter  Medications   lactated ringers  infusion   cefTRIAXone  (ROCEPHIN ) 2 g in sodium chloride  0.9 % 100 mL IVPB    Antibiotic Indication::   Cellulitis   vancomycin  (VANCOCIN ) IVPB 1000 mg/200 mL premix    Indication::   Cellulitis   iohexol  (OMNIPAQUE ) 300 MG/ML solution 100 mL    I have reviewed the patients home medicines and have made adjustments as needed  Problem List / ED Course: Problem List Items Addressed This Visit   None Visit Diagnoses       Cellulitis of right lower extremity    -  Primary     SIRS (systemic inflammatory response syndrome) (HCC)         Chest pain, unspecified type                    Final diagnoses:  Cellulitis of right lower extremity  SIRS (systemic inflammatory response syndrome) (HCC)  Chest pain, unspecified type    ED Discharge Orders     None          Lenor Hollering, MD 03/26/24 2319

## 2024-03-26 NOTE — ED Triage Notes (Signed)
 Pt reports he was sent by his PCP today after a routine visit and his HR was elevated in the 120s, had a temperature of 101F and he has been feeling weak and short of breath x 2 days.   S/P incision and drainage from abscess to right inner thigh on 10/14 and again 10/17.

## 2024-03-26 NOTE — Sepsis Progress Note (Signed)
 Following for sepsis monitoring ?

## 2024-03-26 NOTE — H&P (Incomplete)
 History and Physical  Christopher Roberson FMW:[id number] DOB: 03-20-1976 DOA: 03/26/2024  PCP: [facility] [address]   Chief Complaint: Tachycardia, fever  HPI: Christopher Roberson is a 48 y.o. male with medical history significant for T2DM, CVA with residual right-sided weakness, HTN, morbid obesity, OSA on CPAP and right thigh abscess with repeated I&Ds (last 2 on 10/14 and 10/17) who was sent by PCP to the ED for evaluation of tachycardia and fever.  ED Course: Initial vitals show temp 98.9, HR 70-90, SBP 150-180s, SpO2 89% on room air. Initial labs significant for glucose 213, lactic acid 4.2-2.9, normal renal function, LFTs, CBC, proBNP and flat troponin, UA with significant glucosuria but no signs of infection. EKG shows sinus rhythm. CXR shows no active disease.  CT pelvis shows mild skin thickening and subcutaneous scarring in the superior anterior medial right thigh but no evidence of fluid collection, soft tissue gas or foreign body.  Pt received IV vancomycin , IV Rocephin  and started on IV LR infusion. TRH was consulted for admission.   Review of Systems: Please see HPI for pertinent positives and negatives. A complete 10 system review of systems are otherwise negative.  Past Medical History:  Diagnosis Date   Acute CVA (cerebrovascular accident) (HCC) 04/29/2021   Depression    Diabetes mellitus    Hypertension    Obesity    Sleep apnea    On CPAP machine   Stroke [facility])    Past Surgical History:  Procedure Laterality Date   APPENDECTOMY     CARPAL TUNNEL RELEASE     ESOPHAGOGASTRODUODENOSCOPY     [facility] around 2015-2016   HERNIA REPAIR     IRRIGATION AND DEBRIDEMENT ABSCESS Right 02/06/2024   Procedure: IRRIGATION AND DEBRIDEMENT ABSCESS;  Surgeon: Vernetta Berg, MD;  Location: [facility] OR;  Service: General;  Laterality: Right;  RIGHT THIGH ABSCESS   IRRIGATION AND DEBRIDEMENT ABSCESS Right 02/09/2024   Procedure: IRRIGATION AND DEBRIDEMENT ABSCESS;   Surgeon: Vernetta Berg, MD;  Location: [facility] OR;  Service: General;  Laterality: Right;  RIGHT THIGH DRESSING CHANGE W/ DEBRIDEMENT   Social History:  reports that he has never smoked. He has never used smokeless tobacco. He reports that he does not drink alcohol and does not use drugs.  Allergies  Allergen Reactions   Percocet [Oxycodone -Acetaminophen ] Itching    Can take if takes with Benadryl     Family History  Problem Relation Age of Onset   Heart disease Mother    Colon cancer Father    Cancer Paternal Grandmother    Prostate cancer Paternal Grandfather    Colon cancer Paternal Uncle    Prostate cancer Paternal Uncle    Prostate cancer Paternal Uncle    Esophageal cancer Neg Hx    Rectal cancer Neg Hx    Stomach cancer Neg Hx      Prior to Admission medications   Medication Sig Start Date End Date Taking? Authorizing Provider  acetaminophen  (TYLENOL ) 500 MG tablet Take 2 tablets (1,000 mg total) by mouth every 6 (six) hours as needed. 02/12/24   Augustus Almarie RAMAN, PA-C  atorvastatin  (LIPITOR ) 80 MG tablet Take 80 mg by mouth every evening.    [provider]  blood glucose meter kit and supplies KIT Dispense based on patient and insurance preference. Use up to four times daily as directed. 04/30/21   Krishnan, Gokul, MD  Blood Glucose Monitoring Suppl DEVI 1 each by Does not apply route in the morning, at noon, and at bedtime.  May substitute to any manufacturer covered by patient's insurance. 06/28/23   Motwani, Komal, MD  clopidogrel  (PLAVIX ) 75 MG tablet Take 1 tablet (75 mg total) by mouth daily. 05/01/21   Krishnan, Gokul, MD  Continuous Glucose Sensor (DEXCOM G7 SENSOR) MISC Change sensor every 3 days. 07/26/23   Motwani, Komal, MD  dapagliflozin propanediol (FARXIGA) 10 MG TABS tablet Take 10 mg by mouth daily. 08/13/20   [provider]  ezetimibe  (ZETIA ) 10 MG tablet Take 10 mg by mouth every evening.    [provider]  HUMALOG  KWIKPEN 100 UNIT/ML  KwikPen Inject 0-15 Units into the skin 3 (three) times daily. CBG 70 - 120: 0 units; CBG 121 - 150: 2 units; CBG 151 - 200: 3 units; CBG 201 - 250: 5 units; CBG 251 - 300: 8 units; CBG 301 - 350: 11 units; CBG 351 - 400: 15 units 02/12/24   Cherlyn Labella, MD  hydrALAZINE  (APRESOLINE ) 50 MG tablet Take 50 mg by mouth 2 (two) times daily. 01/23/24   [provider]  Insulin  Pen Needle (PEN NEEDLES) 32G X 4 MM MISC Use as directed in the morning, at noon, in the evening, and at bedtime. 07/26/23   Motwani, Komal, MD  Insulin  Pen Needle 32G X 4 MM MISC Use as directed 04/30/21   Verdene Purchase, MD  Insulin  Pen Needle 32G X 4 MM MISC Use 3 (three) times daily. 02/12/24   Akula, Vijaya, MD  losartan  (COZAAR ) 100 MG tablet Take 100 mg by mouth daily. 05/31/21   [provider]  metFORMIN  (GLUCOPHAGE ) 1000 MG tablet Take 1 tablet (1,000 mg total) by mouth 2 (two) times daily with a meal. 04/30/21   Verdene Purchase, MD  NIFEdipine  (PROCARDIA  XL/NIFEDICAL-XL) 90 MG 24 hr tablet Take 90 mg by mouth daily. 01/31/24   [provider]  nitroGLYCERIN (NITROSTAT) 0.4 MG SL tablet Place 0.4 mg under the tongue every 5 (five) minutes as needed. 01/23/24   [provider]  oxyCODONE  (OXY IR/ROXICODONE ) 5 MG immediate release tablet Take 1 tablet (5 mg total) by mouth every 6 (six) hours as needed for severe pain (pain score 7-10). 02/12/24   Augustus Almarie RAMAN, PA-C  pantoprazole  (PROTONIX ) 40 MG tablet Take 40 mg by mouth daily.    [provider]  pioglitazone  (ACTOS ) 30 MG tablet Take 1 tablet (30 mg total) by mouth daily. 07/26/23 08/25/23  Motwani, Komal, MD  sertraline  (ZOLOFT ) 100 MG tablet Take 100 mg by mouth daily.    [provider]  tadalafil (CIALIS) 20 MG tablet Take 20 mg by mouth daily as needed.    [provider]  traZODone  (DESYREL ) 100 MG tablet Take 100-300 mg by mouth at bedtime as needed for sleep. 07/26/18   [provider]  traZODone   (DESYREL ) 50 MG tablet Take 50 mg by mouth at bedtime.    [provider]  TRULICITY  0.75 MG/0.5ML SOAJ Inject 0.75 mg into the skin every Monday. 01/23/24   [provider]  Vitamin D, Ergocalciferol, (DRISDOL) 1.25 MG (50000 UNIT) CAPS capsule Take 50,000 Units by mouth once a week.    [provider]    Physical Exam: BP (!) 161/87   Pulse 83   Temp 98 F (36.7 C)   Resp (!) 21   Ht 5' 8 (1.727 m)   Wt 124.3 kg   SpO2 91%   BMI 41.66 kg/m  General: Pleasant, well-appearing *** laying in bed. No acute distress. HEENT: [place]/AT. Anicteric sclera  CV: RRR. No murmurs, rubs, or gallops. No LE edema Pulmonary: Lungs CTAB. Normal effort. No wheezing or rales. Abdominal: Soft, nontender, nondistended. Normal bowel sounds. Extremities: Palpable radial and DP pulses. Normal ROM. Skin: Warm and dry. No obvious rash or lesions. Neuro: A&Ox3. Moves all extremities. Normal sensation to light touch. No focal deficit. Psych: Normal mood and affect          Labs on Admission:  Basic Metabolic Panel: Recent Labs  Lab 03/26/24 1850  NA 141  K 3.5  CL 103  CO2 22  GLUCOSE 213*  BUN 11  CREATININE 1.12  CALCIUM  9.5   Liver Function Tests: Recent Labs  Lab 03/26/24 1850  AST 19  ALT 13  ALKPHOS 63  BILITOT 1.0  PROT 7.6  ALBUMIN 4.4   No results for input(s): LIPASE, AMYLASE in the last 168 hours. No results for input(s): AMMONIA in the last 168 hours. CBC: Recent Labs  Lab 03/26/24 1850  WBC 7.9  NEUTROABS 5.1  HGB 13.4  HCT 40.8  MCV 91.7  PLT 343   Cardiac Enzymes: No results for input(s): CKTOTAL, CKMB, CKMBINDEX, TROPONINI in the last 168 hours. BNP (last 3 results) No results for input(s): BNP in the last 8760 hours.  ProBNP (last 3 results) Recent Labs    03/26/24 2109  PROBNP 61.9    CBG: No results for input(s): GLUCAP in the last 168 hours.  Radiological Exams on Admission: CT PELVIS W CONTRAST Result  Date: 03/26/2024 CLINICAL DATA:  Thigh abscess EXAM: CT PELVIS WITH CONTRAST TECHNIQUE: Multidetector CT imaging of the pelvis was performed using the standard protocol following the bolus administration of intravenous contrast. RADIATION DOSE REDUCTION: This exam was performed according to the departmental dose-optimization program which includes automated exposure control, adjustment of the mA and/or kV according to patient size and/or use of iterative reconstruction technique. CONTRAST:  OMNIPAQUE  IOHEXOL  300 MG/ML  SOLN COMPARISON:  CT of the right femur 03/12/2024 FINDINGS: Urinary Tract:  No abnormality visualized. Bowel:  Unremarkable visualized pelvic bowel loops. Vascular/Lymphatic: No pathologically enlarged lymph nodes. No significant vascular abnormality seen. There some prominent bilateral inguinal lymph nodes. Reproductive:  Prostate gland is within normal limits. Other: There is no ascites. There are small supraumbilical fat containing midline ventral hernias. Musculoskeletal: There some mild skin thickening and subcutaneous scarring in the superior anteromedial right thigh which appears similar to the prior study. There is no evidence for fluid collection, soft tissue gas or foreign body. No acute osseous abnormality. IMPRESSION: 1. Mild skin thickening and subcutaneous scarring in the superior anteromedial right thigh appears similar to the prior study. No evidence for fluid collection, soft tissue gas or foreign body. 2. Prominent bilateral inguinal lymph nodes are likely reactive. 3. Small supraumbilical fat containing ventral hernias. Electronically Signed   By: Greig Pique M.D.   On: 03/26/2024 22:15   DG Chest 2 View Result Date: 03/26/2024 CLINICAL DATA:  Fever with tachycardia and weakness. EXAM: CHEST - 2 VIEW COMPARISON:  April 28, 2021 FINDINGS: The heart size and mediastinal contours are within normal limits. A loop recorder device is noted. Both lungs are clear. The  visualized skeletal structures are unremarkable. IMPRESSION: No active cardiopulmonary disease. Electronically Signed   By: Suzen Dials M.D.   On: 03/26/2024 18:55   Assessment/Plan Christopher Roberson is a 48 y.o. male with medical history significant for T2DM, CVA with residual right-sided weakness, HTN, morbid obesity, OSA on CPAP and right thigh abscess with repeated  I&Ds (last 2 on 10/14 and 10/17) who was sent by PCP to the ED for evaluation of tachycardia and fever and admitted for cellulitis of the right thigh  # Cellulitis of the right thigh # Hx of right thigh abscess  #***  #***  #***  #***  #***  #***  DVT prophylaxis: Lovenox      Code Status: Prior  Consults called: None  Family Communication: ***  Severity of Illness: The appropriate patient status for this patient is OBSERVATION. Observation status is judged to be reasonable and necessary in order to provide the required intensity of service to ensure the patient's safety. The patient's presenting symptoms, physical exam findings, and initial radiographic and laboratory data in the context of their medical condition is felt to place them at decreased risk for further clinical deterioration. Furthermore, it is anticipated that the patient will be medically stable for discharge from the hospital within 2 midnights of admission.   Level of care: Telemetry    Lou Claretta HERO, MD 03/26/2024, 11:18 PM [facility] Pager: [phone] Isaiah 41:10   If 7PM-7AM, please contact night-coverage [email or web address] Password [email or web address]

## 2024-03-27 ENCOUNTER — Ambulatory Visit: Admitting: Internal Medicine

## 2024-03-27 ENCOUNTER — Other Ambulatory Visit: Payer: Self-pay

## 2024-03-27 ENCOUNTER — Encounter: Payer: Self-pay | Admitting: Internal Medicine

## 2024-03-27 ENCOUNTER — Observation Stay (HOSPITAL_COMMUNITY)

## 2024-03-27 ENCOUNTER — Encounter (HOSPITAL_COMMUNITY): Payer: Self-pay | Admitting: Student

## 2024-03-27 VITALS — BP 149/84 | HR 74 | Temp 98.6°F | Ht 68.0 in | Wt 271.0 lb

## 2024-03-27 DIAGNOSIS — L02415 Cutaneous abscess of right lower limb: Secondary | ICD-10-CM

## 2024-03-27 LAB — I-STAT CG4 LACTIC ACID, ED: Lactic Acid, Venous: 0.9 mmol/L (ref 0.5–1.9)

## 2024-03-27 MED ORDER — AMOXICILLIN-POT CLAVULANATE 875-125 MG PO TABS: 1.0000 | ORAL_TABLET | Freq: Two times a day (BID) | ORAL | 0 refills | Status: AC

## 2024-03-27 MED ORDER — IOHEXOL 350 MG/ML SOLN
75.0000 mL | Freq: Once | INTRAVENOUS | Status: AC | PRN
  Administered 2024-03-27: 75 mL via INTRAVENOUS

## 2024-03-27 NOTE — Consult Note (Addendum)
 Initial Consultation Note   Patient: Christopher Roberson FMW:[id number] DOB: [birth year] PCP: [facility] [address] DOA: 03/26/2024 DOS: the patient was seen and examined on 03/27/2024 Primary service: Lou Claretta HERO, MD  Referring physician: Lenor Hollering, MD Reason for consult: Right thigh cellulitis  Assessment/Plan: Assessment and Plan: # Hx of right thigh abscess # ?Cellulitis of the right thigh Pt with hx of right thigh abscess with repeated I&Ds (last 2 on 10/14 and 10/17) who follows very closely with infectious disease sent to the ED by her PCP due to tachycardia and a fever of 101. Patient has been afebrile with normal heart rate since arrival to the ED and denies any subjective fevers or chills prior to the PCP visit. He had an elevated lactic acid up to 4.2 that has improved to 2.9 and now cleared. He has no leukocytosis and CT pelvics does not show any fluid collection, soft tissue gas, foreign body or evidence of infection around the right thigh. Patient does not meet criteria for sepsis. On exam, patient's right thigh abscess wound has closed up and healed well with no surrounding erythema, warmth or drainage. He does endorse mild tenderness to the area but this is unchanged. Patient tells me he has follow-up with ID tomorrow and and he is planning to take his son for a scheduled surgery tomorrow.  Patient does not want to stay for admission and plans to follow-up with ID.  I think this is reasonable as patient has close follow-up. - Will discontinue admission with plan for discharge from the ED - Patient advised to follow-up with ID, Dr. Dennise, as scheduled on 12/3 at 4 PM - Patient has Augmentin  at home but advised to hold until follow-up with ID - Patient also advised to follow-up with general surgery as scheduled on 12/5 - I have sent a secure chat to Dr. Dennise  # Tachycardia - Sent to the ED due to tachycardia at the PCP office however patient has had normal heart rate  with HR in the 70s to 90s since admission - Reports a history of intermittent palpitations but denies any palpitations, shortness of breath or chest pain at the moment - CT CAP study was performed by ED provider and did not show any PE  # Fever - Patient sent by PCP due to a fever of 101 at the office - Patient has been afebrile since presented to the ED and has no leukocytosis - Close follow-up with ID in the outpatient   TRH will sign off at present, please call us  again when needed.  HPI: Christopher Roberson is a 48 y.o. male with past medical history of Christopher Roberson is a 48 y.o. male with medical history significant for T2DM, CVA with residual right-sided weakness, HTN, morbid obesity, OSA on CPAP and right thigh abscess with repeated I&Ds (last 2 on 10/14 and 10/17) who was sent by PCP to the ED for evaluation of tachycardia and fever. Patient reports over the last couple days, he has felt a little tired and weak. He had a PCP follow-up today and was found to have a fever of 101 and tachycardic to the 110s. Reports he has a history of palpitations and has a loop recorder but has been in for years. He had mild palpitation at the PCP office but denies any chest pain, shortness of breath, fever, chills, nausea, vomiting, abdominal pain, dysuria, dizziness or headache.  Reports the area around his right thigh abscess is still tender to touch  but no recent erythema, warmth or drainage.   Review of Systems: As mentioned in the history of present illness. All other systems reviewed and are negative. Past Medical History:  Diagnosis Date   Acute CVA (cerebrovascular accident) (HCC) 04/29/2021   Depression    Diabetes mellitus    Hypertension    Obesity    Sleep apnea    On CPAP machine   Stroke [facility])    Past Surgical History:  Procedure Laterality Date   APPENDECTOMY     CARPAL TUNNEL RELEASE     ESOPHAGOGASTRODUODENOSCOPY     [facility] around 2015-2016    HERNIA REPAIR     IRRIGATION AND DEBRIDEMENT ABSCESS Right 02/06/2024   Procedure: IRRIGATION AND DEBRIDEMENT ABSCESS;  Surgeon: Vernetta Berg, MD;  Location: [facility] OR;  Service: General;  Laterality: Right;  RIGHT THIGH ABSCESS   IRRIGATION AND DEBRIDEMENT ABSCESS Right 02/09/2024   Procedure: IRRIGATION AND DEBRIDEMENT ABSCESS;  Surgeon: Vernetta Berg, MD;  Location: [facility] OR;  Service: General;  Laterality: Right;  RIGHT THIGH DRESSING CHANGE W/ DEBRIDEMENT   Social History:  reports that he has never smoked. He has never used smokeless tobacco. He reports that he does not drink alcohol and does not use drugs.  Allergies  Allergen Reactions   Percocet [Oxycodone -Acetaminophen ] Itching    Can take if takes with Benadryl     Family History  Problem Relation Age of Onset   Heart disease Mother    Colon cancer Father    Cancer Paternal Grandmother    Prostate cancer Paternal Grandfather    Colon cancer Paternal Uncle    Prostate cancer Paternal Uncle    Prostate cancer Paternal Uncle    Esophageal cancer Neg Hx    Rectal cancer Neg Hx    Stomach cancer Neg Hx     Prior to Admission medications   Medication Sig Start Date End Date Taking? Authorizing Provider  acetaminophen  (TYLENOL ) 500 MG tablet Take 2 tablets (1,000 mg total) by mouth every 6 (six) hours as needed. 02/12/24   Augustus Almarie RAMAN, PA-C  atorvastatin  (LIPITOR ) 80 MG tablet Take 80 mg by mouth every evening.    [provider]  blood glucose meter kit and supplies KIT Dispense based on patient and insurance preference. Use up to four times daily as directed. 04/30/21   Krishnan, Gokul, MD  Blood Glucose Monitoring Suppl DEVI 1 each by Does not apply route in the morning, at noon, and at bedtime. May substitute to any manufacturer covered by patient's insurance. 06/28/23   Motwani, Komal, MD  clopidogrel  (PLAVIX ) 75 MG tablet Take 1 tablet (75 mg total) by mouth daily. 05/01/21   Krishnan, Gokul, MD  Continuous  Glucose Sensor (DEXCOM G7 SENSOR) MISC Change sensor every 3 days. 07/26/23   Motwani, Komal, MD  dapagliflozin propanediol (FARXIGA) 10 MG TABS tablet Take 10 mg by mouth daily. 08/13/20   [provider]  ezetimibe  (ZETIA ) 10 MG tablet Take 10 mg by mouth every evening.    [provider]  HUMALOG  KWIKPEN 100 UNIT/ML KwikPen Inject 0-15 Units into the skin 3 (three) times daily. CBG 70 - 120: 0 units; CBG 121 - 150: 2 units; CBG 151 - 200: 3 units; CBG 201 - 250: 5 units; CBG 251 - 300: 8 units; CBG 301 - 350: 11 units; CBG 351 - 400: 15 units 02/12/24   Cherlyn Labella, MD  hydrALAZINE  (APRESOLINE ) 50 MG tablet Take 50 mg by mouth 2 (two) times daily. 01/23/24  [provider]  Insulin  Pen Needle (PEN NEEDLES) 32G X 4 MM MISC Use as directed in the morning, at noon, in the evening, and at bedtime. 07/26/23   Motwani, Komal, MD  Insulin  Pen Needle 32G X 4 MM MISC Use as directed 04/30/21   Verdene Purchase, MD  Insulin  Pen Needle 32G X 4 MM MISC Use 3 (three) times daily. 02/12/24   Akula, Vijaya, MD  losartan  (COZAAR ) 100 MG tablet Take 100 mg by mouth daily. 05/31/21   [provider]  metFORMIN  (GLUCOPHAGE ) 1000 MG tablet Take 1 tablet (1,000 mg total) by mouth 2 (two) times daily with a meal. 04/30/21   Verdene Purchase, MD  NIFEdipine  (PROCARDIA  XL/NIFEDICAL-XL) 90 MG 24 hr tablet Take 90 mg by mouth daily. 01/31/24   [provider]  nitroGLYCERIN (NITROSTAT) 0.4 MG SL tablet Place 0.4 mg under the tongue every 5 (five) minutes as needed. 01/23/24   [provider]  oxyCODONE  (OXY IR/ROXICODONE ) 5 MG immediate release tablet Take 1 tablet (5 mg total) by mouth every 6 (six) hours as needed for severe pain (pain score 7-10). 02/12/24   Augustus Almarie RAMAN, PA-C  pantoprazole  (PROTONIX ) 40 MG tablet Take 40 mg by mouth daily.    [provider]  pioglitazone  (ACTOS ) 30 MG tablet Take 1 tablet (30 mg total) by mouth daily. 07/26/23 08/25/23  Motwani,  Komal, MD  sertraline  (ZOLOFT ) 100 MG tablet Take 100 mg by mouth daily.    [provider]  tadalafil (CIALIS) 20 MG tablet Take 20 mg by mouth daily as needed.    [provider]  traZODone  (DESYREL ) 100 MG tablet Take 100-300 mg by mouth at bedtime as needed for sleep. 07/26/18   [provider]  traZODone  (DESYREL ) 50 MG tablet Take 50 mg by mouth at bedtime.    [provider]  TRULICITY  0.75 MG/0.5ML SOAJ Inject 0.75 mg into the skin every Monday. 01/23/24   [provider]  Vitamin D, Ergocalciferol, (DRISDOL) 1.25 MG (50000 UNIT) CAPS capsule Take 50,000 Units by mouth once a week.    [provider]    Physical Exam: Vitals:   03/26/24 2130 03/26/24 2145 03/26/24 2200 03/26/24 2226  BP: (!) 152/82 (!) 144/80 (!) 161/87   Pulse: 77 77 83   Resp: (!) 28 (!) 21    Temp:    98 F (36.7 C)  TempSrc:      SpO2: 96% 94% 91%   Weight:      Height:       General: Pleasant, well-appearing middle-age man laying in bed. No acute distress. HEENT: [place]/AT. Anicteric sclera CV: RRR. No murmurs, rubs, or gallops. No LE edema Pulmonary: Lungs CTAB. Normal effort. No wheezing or rales. Abdominal: Soft, nontender, nondistended. Normal bowel sounds. Extremities: Palpable radial and DP pulses. Normal ROM. Skin: Warm and dry. Right thigh wound healing appropriately with no surrounding erythema, warmth or drainage Neuro: A&Ox3. Moves all extremities. Normal sensation to light touch. No focal deficit. Psych: Normal mood and affect   Data Reviewed:     Latest Ref Rng & Units 03/26/2024    6:50 PM 02/22/2024    1:33 PM 02/14/2024    6:36 PM  CBC  WBC 4.0 - 10.5 K/uL 7.9  5.9  11.5   Hemoglobin 13.0 - 17.0 g/dL 86.5  89.4  88.6   Hematocrit 39.0 - 52.0 % 40.8  31.7  33.8   Platelets 150 - 400 K/uL 343  382  433  Latest Ref Rng & Units 03/26/2024    6:50 PM 02/22/2024    1:33 PM 02/14/2024    8:47 PM  CMP  Glucose 70 - 99 mg/dL 786   795  849   BUN 6 - 20 mg/dL 11  5  7    Creatinine 0.61 - 1.24 mg/dL 8.87  9.28  9.12   Sodium 135 - 145 mmol/L 141  139  134   Potassium 3.5 - 5.1 mmol/L 3.5  3.7  4.0   Chloride 98 - 111 mmol/L 103  104  98   CO2 22 - 32 mmol/L 22  28  22    Calcium  8.9 - 10.3 mg/dL 9.5  9.1  8.7   Total Protein 6.5 - 8.1 g/dL 7.6  6.9    Total Bilirubin 0.0 - 1.2 mg/dL 1.0  0.7    Alkaline Phos 38 - 126 U/L 63     AST 15 - 41 U/L 19  13    ALT 0 - 44 U/L 13  14     Lactic acid is 4.2-2.9, troponin 27-26 Blood cultures pending UA with no signs of infection UA with no signs of infection Results are pending, will review when available.    Family Communication: No family at bedside Primary team communication: Discussed with Dr. Nettie, EDP Thank you very much for involving us  in the care of your patient.  I personally spent a total of 45 minutes in the care of the patient today including preparing to see the patient, getting/reviewing separately obtained history, performing a medically appropriate exam/evaluation, placing orders, referring and communicating with other health care professionals, documenting clinical information in the EHR, and coordinating care.   Author: Claretta CHRISTELLA Alderman, MD 03/27/2024 12:06 AM  For on call review [email or web address].

## 2024-03-27 NOTE — Hospital Course (Signed)
 HPI: Christopher Roberson is a 48 y.o. male with medical history significant for T2DM, CVA with residual right-sided weakness, HTN, morbid obesity, OSA on CPAP and right thigh abscess with repeated I&Ds (last 2 on 10/14 and 10/17) who was sent by PCP to the ED for evaluation of tachycardia and fever.   ED Course: Initial vitals show temp 98.9, HR 70-90, SBP 150-180s, SpO2 89% on room air. Initial labs significant for glucose 213, lactic acid 4.2-2.9, normal renal function, LFTs, CBC, proBNP and flat troponin, UA with significant glucosuria but no signs of infection. EKG shows sinus rhythm. CXR shows no active disease.  CT pelvis shows mild skin thickening and subcutaneous scarring in the superior anterior medial right thigh but no evidence of fluid collection, soft tissue gas or foreign body.  Pt received IV vancomycin , IV Rocephin  and started on IV LR infusion. TRH was consulted for admission.

## 2024-03-27 NOTE — ED Notes (Addendum)
 Patient o2sat 96-100% Sustaining on RA and  HR 110-120  while ambulating,

## 2024-03-27 NOTE — ED Notes (Signed)
 Patient left AMA. Patient upset about not getting update on his scans tonight and states I just want to go home. EDP and hospitalist informed.  AMA signed.

## 2024-03-27 NOTE — Progress Notes (Signed)
 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 (HCC) 02/05/2024   Sepsis due to cellulitis (HCC) 02/04/2024   Abscess of right thigh 02/04/2024   Weakness of right upper extremity 05/22/2021   HLD (hyperlipidemia) 04/29/2021   Compression of right ulnar nerve at multiple levels 07/30/2020   Paresthesias 05/22/2020   Carpal tunnel syndrome of right wrist 02/26/2020   Ulnar neuropathy of right upper extremity 03/04/2019   Ulnar nerve compression, left 02/11/2019   Status post placement of implantable loop recorder 12/07/2018   Degenerative cervical disc 11/04/2018   Cerebellar cerebrovascular accident (CVA) without late effect 05/09/2018   Bell's palsy 05/09/2018   Dry eye syndrome of both lacrimal glands 03/19/2018   Vitreous floaters of both eyes 03/19/2018   Moderate episode of recurrent major depressive disorder (HCC) 02/06/2018   SI (sacroiliac) pain 11/30/2017   GAD (generalized anxiety disorder) 07/20/2017   Severe nonproliferative diabetic retinopathy of both eyes without macular edema associated with type 2 diabetes mellitus (HCC) 07/20/2017   Thoracic radiculopathy 02/21/2017   Chronic right-sided low back pain with right-sided sciatica 02/16/2017   Alcohol abuse 11/01/2016   Severe episode of recurrent major depressive disorder, without psychotic features (HCC) 10/31/2016   Chronic pain of right knee 03/24/2016   Palpitations 08/17/2015   Vitamin D deficiency 06/24/2015   Recurrent depression 06/10/2015   Umbilical hernia 06/10/2015   Personality disorder (HCC) 04/03/2015   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  He was recently admitted 10/12 - 10/24 right-sided weakness suspected stroke, MRI negative.  Found to have abscess of right thigh status post I&D x 2 cultures growing actinomyces naeslundii, general surgery was consulted recommended oral antibiotics.  Patient underwent repeat MRI right thigh on 10/17 ID was engaged patient discharged on Augmentin  x 10 days with follow-up. Since discharged had fever and wound pain. Ct improvded. Augmentin  continued, no longer having fevers 03/07/24: Last dose of Augmentin  yesterday.  No complaints. 03/27/24: ROS  Past Medical History:  Diagnosis Date   Acute CVA (cerebrovascular accident) (HCC) 04/29/2021   Depression    Diabetes mellitus    Hypertension    Obesity    Sleep apnea    On CPAP machine   Stroke [facility])     Outpatient Medications Prior to Visit  Medication Sig Dispense Refill   acetaminophen  (TYLENOL ) 500 MG tablet Take 2 tablets (1,000 mg total) by mouth every 6 (six) hours as needed.     atorvastatin  (LIPITOR ) 80 MG tablet Take 80 mg by mouth every evening.     blood glucose meter kit and supplies KIT Dispense based on patient and insurance preference. Use up to four times  daily as directed. 1 each 0   Blood Glucose Monitoring Suppl DEVI 1 each by Does not apply route in the morning, at noon, and at bedtime. May substitute to any manufacturer covered by patient's insurance. 1 each 0   clopidogrel  (PLAVIX ) 75 MG tablet Take 1 tablet (75 mg  total) by mouth daily. 30 tablet 3   Continuous Glucose Sensor (DEXCOM G7 SENSOR) MISC Change sensor every 3 days. 9 each 3   dapagliflozin propanediol (FARXIGA) 10 MG TABS tablet Take 10 mg by mouth daily.     ezetimibe  (ZETIA ) 10 MG tablet Take 10 mg by mouth every evening.     HUMALOG  KWIKPEN 100 UNIT/ML KwikPen Inject 0-15 Units into the skin 3 (three) times daily. CBG 70 - 120: 0 units; CBG 121 - 150: 2 units; CBG 151 - 200: 3 units; CBG 201 - 250: 5 units; CBG 251 - 300: 8 units; CBG 301 - 350: 11 units; CBG 351 - 400: 15 units 15 mL 11   hydrALAZINE  (APRESOLINE ) 50 MG tablet Take 50 mg by mouth 3 (three) times daily.     Insulin  Pen Needle (PEN NEEDLES) 32G X 4 MM MISC Use as directed in the morning, at noon, in the evening, and at bedtime. 200 each 2   losartan  (COZAAR ) 100 MG tablet Take 100 mg by mouth daily.     metFORMIN  (GLUCOPHAGE ) 1000 MG tablet Take 1 tablet (1,000 mg total) by mouth 2 (two) times daily with a meal. 60 tablet 2   NIFEdipine  (PROCARDIA  XL/NIFEDICAL-XL) 90 MG 24 hr tablet Take 90 mg by mouth daily.     nitroGLYCERIN (NITROSTAT) 0.4 MG SL tablet Place 0.4 mg under the tongue every 5 (five) minutes as needed.     pantoprazole  (PROTONIX ) 40 MG tablet Take 40 mg by mouth daily.     pioglitazone  (ACTOS ) 30 MG tablet Take 1 tablet (30 mg total) by mouth daily. 30 tablet 3   sertraline  (ZOLOFT ) 100 MG tablet Take 100 mg by mouth daily.     tadalafil (CIALIS) 20 MG tablet Take 20 mg by mouth daily as needed.     traZODone  (DESYREL ) 50 MG tablet Take 50 mg by mouth at bedtime.     TRULICITY  0.75 MG/0.5ML SOAJ Inject 0.75 mg into the skin every Monday.     Vitamin D, Ergocalciferol, (DRISDOL) 1.25 MG (50000 UNIT) CAPS capsule Take 50,000 Units by mouth once a week.     oxyCODONE  (OXY IR/ROXICODONE ) 5 MG immediate release tablet Take 1 tablet (5 mg total) by mouth every 6 (six) hours as needed for severe pain (pain score 7-10). (Patient not taking: Reported on 03/27/2024) 20  tablet 0   No facility-administered medications prior to visit.     Allergies  Allergen Reactions   Percocet [Oxycodone -Acetaminophen ] Itching    Can take if takes with Benadryl     Social History   Tobacco Use   Smoking status: Never   Smokeless tobacco: Never  Vaping Use   Vaping status: Never Used  Substance Use Topics   Alcohol use: No   Drug use: Never    Family History  Problem Relation Age of Onset   Heart disease Mother    Colon cancer Father    Cancer Paternal Grandmother    Prostate cancer Paternal Grandfather    Colon cancer Paternal Uncle    Prostate cancer Paternal Uncle    Prostate cancer Paternal Uncle    Esophageal cancer Neg Hx    Rectal cancer Neg  Hx    Stomach cancer Neg Hx     Objective:   Vitals:   03/27/24 1559  BP: (!) 149/84  Pulse: 74  Temp: 98.6 F (37 C)  TempSrc: Temporal  SpO2: 97%  Weight: 271 lb (122.9 kg)  Height: 5' 8 (1.727 m)   Body mass index is 41.21 kg/m.  Physical Exam  Lab Results: Lab Results  Component Value Date   WBC 7.9 03/26/2024   HGB 13.4 03/26/2024   HCT 40.8 03/26/2024   MCV 91.7 03/26/2024   PLT 343 03/26/2024    Lab Results  Component Value Date   CREATININE 1.12 03/26/2024   BUN 11 03/26/2024   NA 141 03/26/2024   K 3.5 03/26/2024   CL 103 03/26/2024   CO2 22 03/26/2024    Lab Results  Component Value Date   ALT 13 03/26/2024   AST 19 03/26/2024   ALKPHOS 63 03/26/2024   BILITOT 1.0 03/26/2024     Assessment & Plan:  48 year old male with history of diabetes and right-sided weakness history of prior CVA presents for management of right thigh abscess.  During recent hospitalization 02-03-09/20 patient underwent I&D right thigh abscess CT right lower extremity on 10/11 had shown subcutaneous abscess and surrounding edema.  Patient underwent I&D with general surgery on 10/14, cultures obtained of purulence, noted abscess cavity going deep into thigh.  Cultures growing actinomyces and  Bacteroides species  -underwent repeat I&D on 10/17, no cultures obtained.  Noted some extension purulent fluid superiorly along incision of thigh to her buttock, no necrosis of evidence of necrotic infection. - Patient discharged on Augmentin  x 10 days. - Seen in the ed on 10/22 for thight pain. Ct showed resolution of abscess right thing, extensive subcutaneous fat straining from right inguinal to medial thigh, c/w cellulits less pronouced from piro sudy -seen by general surgery on 11/6, no tesd no signs of infeciton but did note cloudy yellow drainage. Recommenced packing with f/u in one week. -10/30 labs wbc 5.9, crp 4, esr 29-> stable   #right thigh abscess --Patient completed Augmentin  day prior to last visit on 03/07/2024.  Wound at last visit looked healthy, no concern for infection at that point.  In the end  He was seen in the ED yesterday with weakness and tachycardia sent by pcp for temp of 101, CT pelvis showed mild skin thickening and subcutaneous scarring in the superior anterolateral right thigh appears similar to prior study, no evidence fluid collection soft tissue gas or foreign body.  Exam Banken ceftriaxone  in the ED.  CTA chest did not show PE.  Seen by admitting service who note wound lose dnad healed with no surrouding edema, tenderness of area unchaged.  -No concern for infection on today's exam.  There is no erythema/drainage.  Patient states that he had a one-time fever does not fever since then.  Has not taken any Tylenol  today and he was afebrile.  No leukocytosis noted from ED visit yesterday either. - He sees surgery on Friday.  I will give him Augmentin  for about 5 days for any cellulitis that was present on initial hospitalization although does not seem to be clinically consistent with cellulitis per consult note. Plan: - Augmentin  x 5 days - Follow-up with ID in a month Loney Stank, MD [facility]   03/27/24   4:15 PM I personally spent a total of 45 minutes in the care of the patient today including preparing to  see the patient, getting/reviewing separately obtained history, performing a medically appropriate exam/evaluation, counseling and educating, placing orders, documenting clinical information in the EHR, independently interpreting results, and communicating results.

## 2024-03-31 LAB — CULTURE, BLOOD (ROUTINE X 2)
Culture: NO GROWTH
Culture: NO GROWTH

## 2024-04-10 ENCOUNTER — Emergency Department (HOSPITAL_BASED_OUTPATIENT_CLINIC_OR_DEPARTMENT_OTHER)
Admission: EM | Admit: 2024-04-10 | Discharge: 2024-04-11 | Disposition: A | Discharge status: Home / Self Care | Attending: Emergency Medicine | Admitting: Emergency Medicine

## 2024-04-10 ENCOUNTER — Encounter (HOSPITAL_BASED_OUTPATIENT_CLINIC_OR_DEPARTMENT_OTHER): Payer: Self-pay | Admitting: *Deleted

## 2024-04-10 ENCOUNTER — Other Ambulatory Visit: Payer: Self-pay

## 2024-04-10 ENCOUNTER — Emergency Department (HOSPITAL_BASED_OUTPATIENT_CLINIC_OR_DEPARTMENT_OTHER): Admitting: Radiology

## 2024-04-10 DIAGNOSIS — Z794 Long term (current) use of insulin: Secondary | ICD-10-CM | POA: Diagnosis not present

## 2024-04-10 DIAGNOSIS — Y9351 Activity, roller skating (inline) and skateboarding: Secondary | ICD-10-CM | POA: Insufficient documentation

## 2024-04-10 DIAGNOSIS — S7001XA Contusion of right hip, initial encounter: Secondary | ICD-10-CM | POA: Insufficient documentation

## 2024-04-10 DIAGNOSIS — S79911A Unspecified injury of right hip, initial encounter: Secondary | ICD-10-CM | POA: Diagnosis present

## 2024-04-10 DIAGNOSIS — Z7902 Long term (current) use of antithrombotics/antiplatelets: Secondary | ICD-10-CM | POA: Insufficient documentation

## 2024-04-10 NOTE — ED Triage Notes (Signed)
 Pt was on a skateboard yesterday when he fell back and struck his right hip.  Pt then attempted to get up and fell on the right hip again. Pt states that he continues to have right hip pain, painful to sit

## 2024-04-11 MED ORDER — HYDROCODONE-ACETAMINOPHEN 5-325 MG PO TABS: 1.0000 | ORAL_TABLET | Freq: Four times a day (QID) | ORAL | 0 refills | Status: AC | PRN

## 2024-04-11 NOTE — ED Provider Notes (Signed)
  EMERGENCY DEPARTMENT AT [facility] Provider Note   CSN: [id number] Arrival date & time: 04/10/24  2240     Patient presents with: [facility] T[place] is a 48 y.o. male.   Patient is a 48 year old male presenting with complaints of a right hip injury.  He was riding a skateboard with his teenaged children when the board rolled out from under him and he fell.  He landed on his right hip.  He has been having discomfort with ambulation since.  He denies other injury.  He has been icing it with minimal relief.       Prior to Admission medications  Medication Sig Start Date End Date Taking? Authorizing Provider  acetaminophen  (TYLENOL ) 500 MG tablet Take 2 tablets (1,000 mg total) by mouth every 6 (six) hours as needed. 02/12/24   Augustus Almarie RAMAN, PA-C  atorvastatin  (LIPITOR ) 80 MG tablet Take 80 mg by mouth every evening.    [provider]  blood glucose meter kit and supplies KIT Dispense based on patient and insurance preference. Use up to four times daily as directed. 04/30/21   Krishnan, Gokul, MD  Blood Glucose Monitoring Suppl DEVI 1 each by Does not apply route in the morning, at noon, and at bedtime. May substitute to any manufacturer covered by patient's insurance. 06/28/23   Komal, Motwani, MD  clopidogrel  (PLAVIX ) 75 MG tablet Take 1 tablet (75 mg total) by mouth daily. 05/01/21   Krishnan, Gokul, MD  Continuous Glucose Sensor (DEXCOM G7 SENSOR) MISC Change sensor every 3 days. 07/26/23   Komal, Motwani, MD  dapagliflozin propanediol (FARXIGA) 10 MG TABS tablet Take 10 mg by mouth daily. 08/13/20   [provider]  ezetimibe  (ZETIA ) 10 MG tablet Take 10 mg by mouth every evening.    [provider]  HUMALOG  KWIKPEN 100 UNIT/ML KwikPen Inject 0-15 Units into the skin 3 (three) times daily. CBG 70 - 120: 0 units; CBG 121 - 150: 2 units; CBG 151 - 200: 3 units; CBG 201 - 250: 5 units; CBG 251 - 300: 8 units; CBG  301 - 350: 11 units; CBG 351 - 400: 15 units 02/12/24   Cherlyn Labella, MD  hydrALAZINE  (APRESOLINE ) 50 MG tablet Take 50 mg by mouth 3 (three) times daily. 01/23/24   [provider]  Insulin  Pen Needle (PEN NEEDLES) 32G X 4 MM MISC Use as directed in the morning, at noon, in the evening, and at bedtime. 07/26/23   Komal, Motwani, MD  losartan  (COZAAR ) 100 MG tablet Take 100 mg by mouth daily. 05/31/21   [provider]  metFORMIN  (GLUCOPHAGE ) 1000 MG tablet Take 1 tablet (1,000 mg total) by mouth 2 (two) times daily with a meal. 04/30/21   Verdene Purchase, MD  NIFEdipine  (PROCARDIA  XL/NIFEDICAL-XL) 90 MG 24 hr tablet Take 90 mg by mouth daily. 01/31/24   [provider]  nitroGLYCERIN (NITROSTAT) 0.4 MG SL tablet Place 0.4 mg under the tongue every 5 (five) minutes as needed. 01/23/24   [provider]  oxyCODONE  (OXY IR/ROXICODONE ) 5 MG immediate release tablet Take 1 tablet (5 mg total) by mouth every 6 (six) hours as needed for severe pain (pain score 7-10). Patient not taking: Reported on 03/27/2024 02/12/24   Augustus Almarie RAMAN, PA-C  pantoprazole  (PROTONIX ) 40 MG tablet Take 40 mg by mouth daily.    [provider]  pioglitazone  (ACTOS ) 30 MG tablet Take 1 tablet (30 mg total) by mouth daily.  07/26/23 03/27/24  Komal, Motwani, MD  sertraline  (ZOLOFT ) 100 MG tablet Take 100 mg by mouth daily.    [provider]  tadalafil (CIALIS) 20 MG tablet Take 20 mg by mouth daily as needed.    [provider]  traZODone  (DESYREL ) 50 MG tablet Take 50 mg by mouth at bedtime.    [provider]  TRULICITY  0.75 MG/0.5ML SOAJ Inject 0.75 mg into the skin every Monday. 01/23/24   [provider]  Vitamin D, Ergocalciferol, (DRISDOL) 1.25 MG (50000 UNIT) CAPS capsule Take 50,000 Units by mouth once a week.    [provider]    Allergies: Percocet [oxycodone -acetaminophen ]    Review of Systems  All other systems reviewed and are  negative.   Updated Vital Signs BP (!) 172/95 (BP Location: Right Arm)   Pulse 88   Temp 98.7 F (37.1 C)   Resp 18   SpO2 96%   Physical Exam Vitals and nursing note reviewed.  Constitutional:      Appearance: Normal appearance.  Pulmonary:     Effort: Pulmonary effort is normal.  Musculoskeletal:     Comments: There is tenderness to palpation over the lateral aspect of the left hip.  There is no shortening or rotation of the leg.  DP pulses are palpable and motor and sensation are intact throughout the entire foot and leg.  Skin:    General: Skin is warm and dry.  Neurological:     Mental Status: He is alert and oriented to person, place, and time.     (all labs ordered are listed, but only abnormal results are displayed) Labs Reviewed - No data to display  EKG: None  Radiology: DG Hip Unilat  With Pelvis 2-3 Views Right Result Date: 04/10/2024 EXAM: 2 OR MORE VIEW(S) XRAY OF THE RIGHT HIP 04/10/2024 11:19:00 PM COMPARISON: CT right femur 03/12/2024. CLINICAL HISTORY: fall, right hip pain FINDINGS: BONES AND JOINTS: No acute fracture. No malalignment. Chronically deviated to the right Coccyx and distal Sacrum. SOFT TISSUES: The soft tissues are unremarkable. IMPRESSION: 1. No acute fracture or dislocation of the right hip or visualized pelvis. Electronically signed by: Morgane Naveau MD 04/10/2024 11:29 PM EST RP Workstation: [id number]     Procedures   Medications Ordered in the ED - No data to display                                  Medical Decision Making Amount and/or Complexity of Data Reviewed Radiology: ordered.   X-rays negative for fracture.  This to be treated as a contusion.  Patient to follow-up as needed.     Final diagnoses:  None    ED Discharge Orders     None          Geroldine Berg, MD 04/11/24 0005

## 2024-04-11 NOTE — Discharge Instructions (Addendum)
 Ice for 20 minutes every 2 hours while awake for the next 2 days.  Rest.  Take hydrocodone  as prescribed as needed for pain.  Follow-up with your primary doctor if not improving in the next week.

## 2024-04-29 ENCOUNTER — Other Ambulatory Visit: Payer: Self-pay

## 2024-04-29 ENCOUNTER — Ambulatory Visit: Admitting: Internal Medicine

## 2024-04-29 ENCOUNTER — Encounter: Payer: Self-pay | Admitting: Internal Medicine

## 2024-04-29 VITALS — BP 131/84 | HR 61 | Temp 98.0°F | Ht 68.0 in | Wt 267.0 lb

## 2024-04-29 DIAGNOSIS — R000 Tachycardia, unspecified: Secondary | ICD-10-CM | POA: Diagnosis not present

## 2024-04-29 DIAGNOSIS — L02415 Cutaneous abscess of right lower limb: Secondary | ICD-10-CM | POA: Diagnosis not present

## 2024-04-29 DIAGNOSIS — R531 Weakness: Secondary | ICD-10-CM

## 2024-04-29 NOTE — Progress Notes (Signed)
 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 (HCC) 02/05/2024   Sepsis due to cellulitis (HCC) 02/04/2024   Abscess of right thigh 02/04/2024   Weakness of right upper extremity 05/22/2021   HLD (hyperlipidemia) 04/29/2021   Compression of right ulnar nerve at multiple levels 07/30/2020   Paresthesias 05/22/2020   Carpal tunnel syndrome of right wrist 02/26/2020   Ulnar neuropathy of right upper extremity 03/04/2019   Ulnar nerve compression, left 02/11/2019   Status post placement of implantable loop recorder 12/07/2018   Degenerative cervical disc 11/04/2018   Cerebellar cerebrovascular accident (CVA) without late effect 05/09/2018   Bell's palsy 05/09/2018   Dry eye syndrome of both lacrimal glands 03/19/2018   Vitreous floaters of both eyes 03/19/2018   Moderate episode of recurrent major depressive disorder (HCC) 02/06/2018   SI (sacroiliac) pain 11/30/2017   GAD (generalized anxiety disorder) 07/20/2017   Severe nonproliferative diabetic retinopathy of both eyes without macular edema associated with type 2 diabetes mellitus (HCC) 07/20/2017   Thoracic radiculopathy 02/21/2017   Chronic right-sided low back pain with right-sided sciatica 02/16/2017   Alcohol abuse 11/01/2016   Severe episode of recurrent major depressive disorder, without psychotic features (HCC) 10/31/2016   Chronic pain of right knee 03/24/2016   Palpitations 08/17/2015   Vitamin D deficiency 06/24/2015   Recurrent depression 06/10/2015   Umbilical hernia 06/10/2015   Personality disorder (HCC) 04/03/2015   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  He was recently admitted 10/12 - 10/24 right-sided weakness suspected stroke, MRI negative.  Found to have abscess of right thigh status post I&D x 2 cultures growing actinomyces naeslundii, general surgery was consulted recommended oral antibiotics.  Patient underwent repeat MRI right thigh on 10/17 ID was engaged patient discharged on Augmentin  x 10 days with follow-up. Since discharged had fever and wound pain. Ct improvded. Augmentin  continued, no longer having fevers 03/07/24: Last dose of Augmentin  yesterday.  No complaints. 03/27/24: Doing well. No new complaints 04/29/24: Seen by surgery in the interim, with no signs of infection.   Review of Systems  All other systems reviewed and are negative.   Past Medical History:  Diagnosis Date   Acute CVA (cerebrovascular accident) (HCC) 04/29/2021   Depression    Diabetes mellitus    Hypertension    Obesity    Sleep apnea    On CPAP machine   Stroke [facility])     Outpatient Medications Prior to Visit  Medication Sig Dispense Refill   acetaminophen  (TYLENOL ) 500 MG tablet Take 2 tablets (1,000 mg total) by mouth every 6 (six) hours as  needed.     atorvastatin  (LIPITOR ) 80 MG tablet Take 80 mg by mouth every evening.     blood glucose meter kit and supplies KIT Dispense based on patient and insurance preference. Use up to four times daily as directed. 1 each 0   Blood Glucose Monitoring Suppl DEVI 1 each by  Does not apply route in the morning, at noon, and at bedtime. May substitute to any manufacturer covered by patient's insurance. 1 each 0   clopidogrel  (PLAVIX ) 75 MG tablet Take 1 tablet (75 mg total) by mouth daily. 30 tablet 3   Continuous Glucose Sensor (DEXCOM G7 SENSOR) MISC Change sensor every 3 days. 9 each 3   dapagliflozin propanediol (FARXIGA) 10 MG TABS tablet Take 10 mg by mouth daily.     ezetimibe  (ZETIA ) 10 MG tablet Take 10 mg by mouth every evening.     HUMALOG  KWIKPEN 100 UNIT/ML KwikPen Inject 0-15 Units into the skin 3 (three) times daily. CBG 70 - 120: 0 units; CBG 121 - 150: 2 units; CBG 151 - 200: 3 units; CBG 201 - 250: 5 units; CBG 251 - 300: 8 units; CBG 301 - 350: 11 units; CBG 351 - 400: 15 units 15 mL 11   hydrALAZINE  (APRESOLINE ) 50 MG tablet Take 50 mg by mouth 3 (three) times daily.     HYDROcodone -acetaminophen  (NORCO/VICODIN) 5-325 MG tablet Take 1-2 tablets by mouth every 6 (six) hours as needed. 12 tablet 0   Insulin  Pen Needle (PEN NEEDLES) 32G X 4 MM MISC Use as directed in the morning, at noon, in the evening, and at bedtime. 200 each 2   losartan  (COZAAR ) 100 MG tablet Take 100 mg by mouth daily.     metFORMIN  (GLUCOPHAGE ) 1000 MG tablet Take 1 tablet (1,000 mg total) by mouth 2 (two) times daily with a meal. 60 tablet 2   NIFEdipine  (PROCARDIA  XL/NIFEDICAL-XL) 90 MG 24 hr tablet Take 90 mg by mouth daily.     nitroGLYCERIN (NITROSTAT) 0.4 MG SL tablet Place 0.4 mg under the tongue every 5 (five) minutes as needed.     pantoprazole  (PROTONIX ) 40 MG tablet Take 40 mg by mouth daily.     sertraline  (ZOLOFT ) 100 MG tablet Take 100 mg by mouth daily.     tadalafil (CIALIS) 20 MG tablet Take 20 mg by mouth daily as needed.     traZODone  (DESYREL ) 50 MG tablet Take 50 mg by mouth at bedtime.     TRULICITY  0.75 MG/0.5ML SOAJ Inject 0.75 mg into the skin every Monday.     Vitamin D, Ergocalciferol, (DRISDOL) 1.25 MG (50000 UNIT) CAPS capsule Take 50,000 Units by  mouth once a week.     oxyCODONE  (OXY IR/ROXICODONE ) 5 MG immediate release tablet Take 1 tablet (5 mg total) by mouth every 6 (six) hours as needed for severe pain (pain score 7-10). (Patient not taking: Reported on 03/27/2024) 20 tablet 0   pioglitazone  (ACTOS ) 30 MG tablet Take 1 tablet (30 mg total) by mouth daily. (Patient not taking: Reported on 04/29/2024) 30 tablet 3   No facility-administered medications prior to visit.     Allergies[1]  Social History[2]  Family History  Problem Relation Age of Onset   Heart disease Mother    Colon cancer Father    Cancer Paternal Grandmother    Prostate cancer Paternal Grandfather    Colon cancer Paternal Uncle    Prostate cancer Paternal Uncle    Prostate cancer Paternal Uncle    Esophageal  cancer Neg Hx    Rectal cancer Neg Hx    Stomach cancer Neg Hx     Objective:   Vitals:   04/29/24 1417  BP: 131/84  Pulse: 61  Temp: 98 F (36.7 C)  TempSrc: Temporal  SpO2: 95%  Weight: 267 lb (121.1 kg)  Height: 5' 8 (1.727 m)   Body mass index is 40.6 kg/m.  Physical Exam Constitutional:      General: He is not in acute distress.    Appearance: He is normal weight. He is not toxic-appearing.  HENT:     Head: Normocephalic and atraumatic.     Right Ear: External ear normal.     Left Ear: External ear normal.     Nose: No congestion or rhinorrhea.     Mouth/Throat:     Mouth: Mucous membranes are moist.     Pharynx: Oropharynx is clear.  Eyes:     Extraocular Movements: Extraocular movements intact.     Conjunctiva/sclera: Conjunctivae normal.     Pupils: Pupils are equal, round, and reactive to light.  Cardiovascular:     Rate and Rhythm: Normal rate and regular rhythm.     Heart sounds: No murmur heard.    No friction rub. No gallop.  Pulmonary:     Effort: Pulmonary effort is normal.     Breath sounds: Normal breath sounds.  Abdominal:     General: Abdomen is flat. Bowel sounds are normal.     Palpations: Abdomen is  soft.  Musculoskeletal:     Cervical back: Normal range of motion and neck supple.  Skin:    General: Skin is warm and dry.  Neurological:     General: No focal deficit present.     Mental Status: He is oriented to person, place, and time.  Psychiatric:        Mood and Affect: Mood normal.     Lab Results: Lab Results  Component Value Date   WBC 7.9 03/26/2024   HGB 13.4 03/26/2024   HCT 40.8 03/26/2024   MCV 91.7 03/26/2024   PLT 343 03/26/2024    Lab Results  Component Value Date   CREATININE 1.12 03/26/2024   BUN 11 03/26/2024   NA 141 03/26/2024   K 3.5 03/26/2024   CL 103 03/26/2024   CO2 22 03/26/2024    Lab Results  Component Value Date   ALT 13 03/26/2024   AST 19 03/26/2024   ALKPHOS 63 03/26/2024   BILITOT 1.0 03/26/2024     Assessment & Plan:  49 year old male with history of diabetes and right-sided weakness history of prior CVA presents for management of right thigh abscess.  During recent hospitalization 02-03-09/20 patient underwent I&D right thigh abscess CT right lower extremity on 10/11 had shown subcutaneous abscess and surrounding edema.  Patient underwent I&D with general surgery on 10/14, cultures obtained of purulence, noted abscess cavity going deep into thigh.  Cultures growing actinomyces and Bacteroides species  -underwent repeat I&D on 10/17, no cultures obtained.  Noted some extension purulent fluid superiorly along incision of thigh to her buttock, no necrosis of evidence of necrotic infection. - Patient discharged on Augmentin  x 10 days. - Seen in the ed on 10/22 for thight pain. Ct showed resolution of abscess right thing, extensive subcutaneous fat straining from right inguinal to medial thigh, c/w cellulits less pronouced from piro sudy -seen by general surgery on 11/6, no tesd no signs of infeciton but did note cloudy yellow drainage. Recommenced packing  with f/u in one week. -10/30 labs wbc 5.9, crp 4, esr 29-> stable   #right thigh  abscess --Patient completed Augmentin  day prior to last visit on 03/07/2024.  Wound at last visit looked healthy, no concern for infection at that point.  In the end  He was seen in the ED yesterday with weakness and tachycardia sent by pcp for temp of 101, CT pelvis showed mild skin thickening and subcutaneous scarring in the superior anterolateral right thigh appears similar to prior study, no evidence fluid collection soft tissue gas or foreign body.  Exam Banken ceftriaxone  in the ED.  CTA chest did not show PE.  Seen by admitting service who note wound lose dnad healed with no surrouding edema, tenderness of area unchaged.  -No concern for infection on 03/27/24 exam, he ws given augmentin  x 5 days as seen by surgery on 12/5. NO concerns fori nfection noted at surgery appt. - wound healed on today's exam. Pt has been off of abx. -F/u with ID prn    Loney Stank, MD [facility]   04/29/2024  2:39 PM  I personally spent a total of 42 minutes in the care of the patient today including preparing to see the patient, getting/reviewing separately obtained history, performing a medically appropriate exam/evaluation, counseling and educating, documenting clinical information in the EHR, independently interpreting results, and communicating results.      [1]  Allergies Allergen Reactions   Percocet [Oxycodone -Acetaminophen ] Itching    Can take if takes with Benadryl   [2]  Social History Tobacco Use   Smoking status: Never   Smokeless tobacco: Never  Vaping Use   Vaping status: Never Used  Substance Use Topics   Alcohol use: No   Drug use: Never   "

## 2024-04-30 ENCOUNTER — Encounter: Payer: Self-pay | Admitting: Registered Nurse

## 2024-05-06 ENCOUNTER — Ambulatory Visit
Admission: RE | Admit: 2024-05-06 | Discharge: 2024-05-06 | Disposition: A | Source: Ambulatory Visit | Attending: Registered Nurse

## 2024-05-06 ENCOUNTER — Other Ambulatory Visit: Payer: Self-pay | Admitting: Registered Nurse

## 2024-05-06 DIAGNOSIS — M069 Rheumatoid arthritis, unspecified: Secondary | ICD-10-CM

## 2024-05-23 ENCOUNTER — Institutional Professional Consult (permissible substitution): Admitting: Neurology

## 2024-05-28 ENCOUNTER — Institutional Professional Consult (permissible substitution): Admitting: Neurology
